# Patient Record
Sex: Female | Born: 1980 | Race: Black or African American | Hispanic: No | Marital: Single | Smoking: Current every day smoker
Health system: Southern US, Community
[De-identification: ages and names within clinical notes are randomized; demographics above are authoritative.]

## PROBLEM LIST (undated history)

## (undated) DIAGNOSIS — N189 Chronic kidney disease, unspecified: Secondary | ICD-10-CM

## (undated) DIAGNOSIS — N200 Calculus of kidney: Secondary | ICD-10-CM

## (undated) DIAGNOSIS — N946 Dysmenorrhea, unspecified: Secondary | ICD-10-CM

## (undated) DIAGNOSIS — R519 Headache, unspecified: Secondary | ICD-10-CM

## (undated) DIAGNOSIS — R51 Headache: Secondary | ICD-10-CM

## (undated) DIAGNOSIS — K219 Gastro-esophageal reflux disease without esophagitis: Secondary | ICD-10-CM

## (undated) DIAGNOSIS — E119 Type 2 diabetes mellitus without complications: Secondary | ICD-10-CM

## (undated) DIAGNOSIS — D219 Benign neoplasm of connective and other soft tissue, unspecified: Secondary | ICD-10-CM

## (undated) HISTORY — DX: Headache, unspecified: R51.9

## (undated) HISTORY — DX: Dysmenorrhea, unspecified: N94.6

## (undated) HISTORY — PX: LITHOTRIPSY: SUR834

## (undated) HISTORY — DX: Calculus of kidney: N20.0

## (undated) HISTORY — DX: Headache: R51

---
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
## 2000-01-27 ENCOUNTER — Emergency Department (HOSPITAL_COMMUNITY): Admission: EM | Admit: 2000-01-27 | Discharge: 2000-01-27 | Payer: Self-pay

## 2000-01-30 ENCOUNTER — Emergency Department (HOSPITAL_COMMUNITY): Admission: EM | Admit: 2000-01-30 | Discharge: 2000-01-30 | Payer: Self-pay | Admitting: *Deleted

## 2000-01-30 ENCOUNTER — Encounter: Payer: Self-pay | Admitting: *Deleted

## 2000-03-30 ENCOUNTER — Encounter: Payer: Self-pay | Admitting: Family Medicine

## 2000-03-30 ENCOUNTER — Ambulatory Visit (HOSPITAL_COMMUNITY): Admission: RE | Admit: 2000-03-30 | Discharge: 2000-03-30 | Payer: Self-pay | Admitting: Family Medicine

## 2000-04-05 ENCOUNTER — Inpatient Hospital Stay (HOSPITAL_COMMUNITY): Admission: AD | Admit: 2000-04-05 | Discharge: 2000-04-05 | Payer: Self-pay | Admitting: *Deleted

## 2000-04-05 ENCOUNTER — Encounter: Payer: Self-pay | Admitting: *Deleted

## 2000-07-05 ENCOUNTER — Inpatient Hospital Stay (HOSPITAL_COMMUNITY): Admission: AD | Admit: 2000-07-05 | Discharge: 2000-07-05 | Payer: Self-pay | Admitting: Obstetrics

## 2000-08-11 ENCOUNTER — Inpatient Hospital Stay (HOSPITAL_COMMUNITY): Admission: AD | Admit: 2000-08-11 | Discharge: 2000-08-11 | Payer: Self-pay | Admitting: Obstetrics and Gynecology

## 2000-08-22 ENCOUNTER — Inpatient Hospital Stay (HOSPITAL_COMMUNITY): Admission: AD | Admit: 2000-08-22 | Discharge: 2000-08-23 | Payer: Self-pay | Admitting: Obstetrics and Gynecology

## 2000-10-10 ENCOUNTER — Encounter: Admission: RE | Admit: 2000-10-10 | Discharge: 2001-01-08 | Payer: Self-pay | Admitting: Obstetrics and Gynecology

## 2000-10-16 ENCOUNTER — Inpatient Hospital Stay (HOSPITAL_COMMUNITY): Admission: AD | Admit: 2000-10-16 | Discharge: 2000-10-22 | Payer: Self-pay | Admitting: Obstetrics and Gynecology

## 2000-10-19 ENCOUNTER — Encounter: Payer: Self-pay | Admitting: Obstetrics & Gynecology

## 2000-11-01 ENCOUNTER — Inpatient Hospital Stay (HOSPITAL_COMMUNITY): Admission: AD | Admit: 2000-11-01 | Discharge: 2000-11-05 | Payer: Self-pay | Admitting: Obstetrics and Gynecology

## 2001-01-28 ENCOUNTER — Emergency Department (HOSPITAL_COMMUNITY): Admission: EM | Admit: 2001-01-28 | Discharge: 2001-01-28 | Payer: Self-pay

## 2001-01-28 ENCOUNTER — Encounter: Payer: Self-pay | Admitting: Emergency Medicine

## 2001-06-15 ENCOUNTER — Inpatient Hospital Stay (HOSPITAL_COMMUNITY): Admission: AD | Admit: 2001-06-15 | Discharge: 2001-06-15 | Payer: Self-pay | Admitting: Obstetrics and Gynecology

## 2003-04-03 ENCOUNTER — Encounter: Payer: Self-pay | Admitting: Emergency Medicine

## 2003-04-03 ENCOUNTER — Emergency Department (HOSPITAL_COMMUNITY): Admission: EM | Admit: 2003-04-03 | Discharge: 2003-04-03 | Payer: Self-pay | Admitting: Emergency Medicine

## 2005-10-19 ENCOUNTER — Emergency Department (HOSPITAL_COMMUNITY): Admission: EM | Admit: 2005-10-19 | Discharge: 2005-10-19 | Payer: Self-pay | Admitting: Emergency Medicine

## 2008-06-25 ENCOUNTER — Emergency Department (HOSPITAL_COMMUNITY): Admission: EM | Admit: 2008-06-25 | Discharge: 2008-06-25 | Payer: Self-pay | Admitting: Emergency Medicine

## 2009-11-07 ENCOUNTER — Emergency Department (HOSPITAL_COMMUNITY): Admission: EM | Admit: 2009-11-07 | Discharge: 2009-11-07 | Payer: Self-pay | Admitting: Emergency Medicine

## 2011-01-16 LAB — URINALYSIS, ROUTINE W REFLEX MICROSCOPIC
Glucose, UA: 250 mg/dL — AB
Hgb urine dipstick: NEGATIVE
Ketones, ur: NEGATIVE mg/dL
Protein, ur: NEGATIVE mg/dL

## 2011-03-18 NOTE — Discharge Summary (Signed)
[facility]  PatientTINLEIGH, Maria [name] Visit Number: [id number] MRN: [id number]          Service Type: OBS Location: 910B [id number] [id number] Attending Physician:  Marcelle Overlie Dictated by:   Maria Maria [name], P.A.-C. Admit Date:  08/21/2000 Discharge Date: 08/23/2000                             Discharge Summary  FINAL DIAGNOSES:              1. Intrauterine pregnancy at 25-3/[redacted] weeks                                  gestation.                               2. Cellulitis of the buttocks.  HISTORY:                      This 30 year old G1, P0, presents at 25-3/[redacted] weeks gestation complaining of buttocks pain.  The patient was being treated for a boil on her buttocks with Keflex but experienced nausea with the Keflex and therefore discontinued.  She had increased pain on her buttocks and drainage.  She denied any fever or chills.  She was not having any contractions.  PHYSICAL EXAMINATION:         There was a draining sore in the inner crease of her buttocks with surrounding warmth, tenderness, and induration.  HOSPITAL COURSE:              At this point, the patient was admitted for IV antibiotics and sitz baths.  The patient continued to have pain on hospital day #1.  Her pain starting decreasing by hospital day #2, and she was on her Ancef and was continued at this point.  By hospital day #3, the patient was feeling much better and was ready for discharge.  On her buttocks, there was no erythema, no drainage, but still a little bit of tenderness.  The patient was sent home on a regular diet, told to decrease activity, told to continue her sitz bath.  DISCHARGE MEDICATIONS:        1. Keflex 500 mg 1 q.i.d. x 7 days.                               2. Tylox #20, 1 every 4 hours as needed for                                  pain.  FOLLOW-UP:                    The patient was to keep her appointment in the office for the next day for her one hour sugar  test. Dictated by:   Maria Maria [name], P.A.-C. Attending Physician:  Marcelle Overlie DD:  09/13/00 TD:  09/13/00 Job: [id number]

## 2011-03-18 NOTE — Discharge Summary (Signed)
[facility]  PatientKERINGTON, HILDEBRANT Visit Number: [id number] MRN: [id number]          Service Type: OBS Location: 910A 9107 01 Attending Physician:  Marcelle Overlie Dictated by:   Leilani Able, P.A. Admit Date:  11/01/2000 Discharge Date: 11/05/2000                             Discharge Summary  FINAL DIAGNOSES:              1. Intrauterine pregnancy at 33-1/[redacted] weeks                                  gestation.                               2. Preterm labor.                               3. Insulin requiring gestational diabetes                                  mellitus.  HISTORY/HOSPITAL COURSE:      This 30 year old G1, P0 presents around 33-1/7 weeks complaining of cramping for two days.  She has noticed an increase in her pain and cervix was noted to be about 2 to 3 cm dilated, 80% effaced and a -2 station upon admission.  She was started in IV fluids and subcutaneous terbutaline but the contractions continued.  Therefore she was admitted.  The patient was admitted at this time, started on magnesium sulfate and betamethasone protocol.  She had an increase in her contractions and pain and the magnesium was increased to 3 g/hour.  The patients blood sugars were still real elevated.  She was started on sliding scale insulin at this point. Her contractions started to resolve.  She was feeling better.  Her magnesium sulfate was decreased to 2 g on October 18, 2000, and she was continued on her insulin.  The patients group B strep culture came back as positive and on October 19, 2000, she was started on Unasyn 3 g IV every six hours to cover that.  She was continued on her magnesium sulfate. By hospital day #6, the patient was continued on her insulin and she was weaned off her magnesium sulfate and started on Procardia.  She had complained of some labial swelling and pain but the etiology was questionable.  By hospital day #7, the  patient was not having any contractions on her oral terbutaline.  Her diabetes was well controlled on her insulin regimen and she was felt ready for discharge. She was sent home on Procardia 10 mg q.i.d., bedrest, insulin and penicillin q.i.d. x 10 days to cover the group B strep.  The patient was to follow up in the office in four days and to call with any problems. Dictated by:   Leilani Able, P.A. Attending Physician:  Marcelle Overlie DD:  11/28/00 TD:  11/28/00 Job: [id number]

## 2011-03-18 NOTE — Op Note (Signed)
[facility]  Patient:    Maria [name], Maria [name]                        MRN: [id number] Proc. Date: 11/02/00 Adm. Date:  [id number] Attending:  Marcelle Overlie                           Operative Report  PREOPERATIVE DIAGNOSES:       1. Intrauterine pregnancy at 81 weeks estimated                                  gestational age.                               2. Failure to progress.                               3. Insulin-dependent gestational diabetes.                               4. Prolonged rupture of membranes.  POSTOPERATIVE DIAGNOSES:      1. Intrauterine pregnancy at 31 weeks estimated                                  gestational age.                               2. Failure to progress.                               3. Insulin-dependent gestational diabetes.                               4. Prolonged rupture of membranes.  PROCEDURE:                    Primary low-transverse cesarean section.  SURGEON:                      Mark E. Dareen Piano, M.D.  ANESTHESIA:                   Epidural.  ANTIBIOTICS:                  Penicillin.  DRAINS:                       Foley, bedside drainage.  ESTIMATED BLOOD LOSS:         900 cc.  COMPLICATIONS:                None.  SPECIMENS:                    None.  FINDINGS:                     Patient had normal-appearing fallopian tubes, ovary and uterus.  The placenta appeared to be normal and had a 3-vessel cord.  PROCEDURE:  Patient was taken to the operating room, where she was placed in the dorsal supine position with a left lateral tilt.  Once an adequate anesthetic level was reached, she was prepped with Hibiclens and draped in the usual fashion for this procedure.  A Pfannenstiel incision was made.  This was carried down to fascia.  The fascia was entered in the midline and extended laterally with the Mayo scissors.  The rectus muscles were then sharply dissected from the fascia.  Rectus muscles were divided in the midline and taken superiorly and inferiorly.  The parietal peritoneum was entered sharply.  The bladder flap was taken down sharply.  A low-transverse uterine incision was made in the midline and extended laterally with blunt dissection. Amniotic fluid was noted to be clear when entering the amniotic sac.  The infant was delivered in the vertex presentation.  On delivery of the head, the nostrils and oropharynx were bulb suctioned.  The remaining infant was then delivered.  The cord was doubly clamped and cut and the infant handed to the waiting NICU team.  The placenta was then manually removed.  The uterus was exteriorized.  The uterine cavity was wiped with a wet lap.  The uterine incision was closed in a single layer of 0 chromic in a running locking fashion.  The posterior cul-de-sac was suctioned dry.  The uterus was placed back in the abdominal cavity.  The abdominal gutters were then wiped with a wet lap.  The bladder flap was not closed.  The parietal peritoneum was not closed.  The fascia was closed using 0 Monocryl in a running fashion.  The subcuticular tissue was made hemostatic with the Bovie.  Stainless steel clips were used to close the skin.  The patient tolerated the procedure well and she was taken to the recovery room in stable condition.  Instrument and lap counts correct x 2. DD:  11/02/00 TD:  11/02/00 Job: [id number]

## 2011-03-18 NOTE — Discharge Summary (Signed)
[facility]  PatientANVI, Maria [name]                        MRN: [id number] Adm. Date:  [id number] Disc. Date: [id number] Attending:  Marcelle Overlie Dictator:   Leilani Able, P.A.                           Discharge Summary  FINAL DIAGNOSES:              1. Intrauterine pregnancy at 36 weeks estimated                                  gestational age.                               2. Failure to progress.                               3. Insulin-dependent gestational diabetes.                               4. Prolonged rupture of membranes.  PROCEDURE:                    Primary low transverse cesarean section.  SURGEON:                      Mark E. Dareen Piano, M.D.  COMPLICATIONS:                None.  HISTORY OF PRESENT ILLNESS:   This 30 year old, G1, P0 presents at 35-6/7ths weeks gestation for induction secondary to prolonged preterm premature rupture of membranes with the AFI in the office today of about 2.9 cm.  Patients prenatal course has been complicated by insulin-dependent gestational diabetes.  Patient decided to stop her insulin on her own two days ago.  She is also positive group B strep, has a history of preterm labor, and has received steroids already this pregnancy.  She also has a history of asthma.  HOSPITAL COURSE:              At this point, patient is admitted by Dr. Marcelle Overlie.  Patient started on Pitocin and penicillin for positive group B strep status.  She is also on an insulin drip per protocol.  Patient had been on Pitocin since admission and had no change in her cervix.  By November 02, 2000, at this point, patient will go to the OR for cesarean section secondary to failure to progress.  She was taken to the operating room by Dr. Malva Limes where a primary low transverse cesarean section was performed with the delivery of a 5-pound-8-ounce female infant with Apgars of 8 and 9.  Delivery went without  complication.  Patients postoperative course was benign without significant fevers.  She was felt ready for discharge on postoperative day #4.  She was sent home on a regular diet, told to decrease activities, told to continue prenatal vitamins and FeSO4, was given Tylenol one to two every four hours as needed for pain and told she could use Motrin and to follow up in the office in four weeks. DD:  11/28/00 TD:  11/28/00 Job: [id number]

## 2011-08-18 ENCOUNTER — Inpatient Hospital Stay (INDEPENDENT_AMBULATORY_CARE_PROVIDER_SITE_OTHER)
Admission: RE | Admit: 2011-08-18 | Discharge: 2011-08-18 | Disposition: A | Discharge status: Home / Self Care | Payer: Self-pay | Source: Ambulatory Visit | Attending: Family Medicine | Admitting: Family Medicine

## 2011-08-18 DIAGNOSIS — J039 Acute tonsillitis, unspecified: Secondary | ICD-10-CM

## 2011-08-22 ENCOUNTER — Emergency Department (HOSPITAL_COMMUNITY)
Admission: EM | Admit: 2011-08-22 | Discharge: 2011-08-23 | Disposition: A | Discharge status: Home / Self Care | Payer: Self-pay | Attending: Emergency Medicine | Admitting: Emergency Medicine

## 2011-08-22 DIAGNOSIS — J029 Acute pharyngitis, unspecified: Secondary | ICD-10-CM | POA: Insufficient documentation

## 2011-08-22 DIAGNOSIS — R509 Fever, unspecified: Secondary | ICD-10-CM | POA: Insufficient documentation

## 2011-08-22 DIAGNOSIS — R1310 Dysphagia, unspecified: Secondary | ICD-10-CM | POA: Insufficient documentation

## 2011-08-22 DIAGNOSIS — E860 Dehydration: Secondary | ICD-10-CM | POA: Insufficient documentation

## 2011-08-22 DIAGNOSIS — B370 Candidal stomatitis: Secondary | ICD-10-CM | POA: Insufficient documentation

## 2011-08-22 LAB — RAPID STREP SCREEN (MED CTR MEBANE ONLY): Streptococcus, Group A Screen (Direct): NEGATIVE

## 2012-09-23 ENCOUNTER — Encounter (HOSPITAL_COMMUNITY): Payer: Self-pay | Admitting: *Deleted

## 2012-09-23 ENCOUNTER — Emergency Department (HOSPITAL_COMMUNITY)
Admission: EM | Admit: 2012-09-23 | Discharge: 2012-09-23 | Disposition: A | Discharge status: Home / Self Care | Payer: Self-pay | Attending: Emergency Medicine | Admitting: Emergency Medicine

## 2012-09-23 DIAGNOSIS — K219 Gastro-esophageal reflux disease without esophagitis: Secondary | ICD-10-CM | POA: Insufficient documentation

## 2012-09-23 DIAGNOSIS — R1013 Epigastric pain: Secondary | ICD-10-CM | POA: Insufficient documentation

## 2012-09-23 DIAGNOSIS — M549 Dorsalgia, unspecified: Secondary | ICD-10-CM | POA: Insufficient documentation

## 2012-09-23 DIAGNOSIS — R109 Unspecified abdominal pain: Secondary | ICD-10-CM

## 2012-09-23 DIAGNOSIS — F17200 Nicotine dependence, unspecified, uncomplicated: Secondary | ICD-10-CM | POA: Insufficient documentation

## 2012-09-23 DIAGNOSIS — R197 Diarrhea, unspecified: Secondary | ICD-10-CM | POA: Insufficient documentation

## 2012-09-23 LAB — URINALYSIS, ROUTINE W REFLEX MICROSCOPIC
Ketones, ur: NEGATIVE mg/dL
Leukocytes, UA: NEGATIVE
Nitrite: NEGATIVE
Protein, ur: NEGATIVE mg/dL
pH: 5.5 (ref 5.0–8.0)

## 2012-09-23 LAB — BASIC METABOLIC PANEL
Calcium: 9 mg/dL (ref 8.4–10.5)
Chloride: 104 mEq/L (ref 96–112)
Creatinine, Ser: 0.93 mg/dL (ref 0.50–1.10)
GFR calc Af Amer: >90 mL/min (ref >90)
Sodium: 139 mEq/L (ref 135–145)

## 2012-09-23 LAB — URINE MICROSCOPIC-ADD ON

## 2012-09-23 LAB — CBC WITH DIFFERENTIAL/PLATELET
Basophils Absolute: 0 10*3/uL (ref 0.0–0.1)
Basophils Relative: 0 % (ref 0–1)
HCT: 40.3 % (ref 36.0–46.0)
MCHC: 33.7 g/dL (ref 30.0–36.0)
Monocytes Absolute: 0.4 10*3/uL (ref 0.1–1.0)
Neutro Abs: 4.2 10*3/uL (ref 1.7–7.7)
Platelets: 204 10*3/uL (ref 150–400)
RDW: 13.4 % (ref 11.5–15.5)

## 2012-09-23 LAB — HEPATIC FUNCTION PANEL: Bilirubin, Direct: <0.1 mg/dL (ref 0.0–0.3)

## 2012-09-23 MED ORDER — GI COCKTAIL ~~LOC~~
30.0000 mL | Freq: Once | ORAL | Status: AC
  Administered 2012-09-23: 30 mL via ORAL
  Filled 2012-09-23: qty 30

## 2012-09-23 NOTE — ED Provider Notes (Signed)
History  This chart was scribed for Geoffery Lyons, MD by Shari Heritage, ED Scribe. The patient was seen in room TR09C/TR09C. Patient's care was started at 1129.   CSN: [id number]  Arrival date & time 09/23/12  1031   First MD Initiated Contact with Patient 09/23/12 1129      Chief Complaint  Patient presents with  . Abdominal Pain    The history is provided by the patient. No language interpreter was used.    HPI Comments: Maria [name] is a 31 y.o. female who presents to the Emergency Department complaining of moderate to severe, burning, epigastric abdominal pain onset 1 week ago. Patient states that the pain is always present, but it waxes and wanes in severity. There is associated diarrhea and lower back pain that shoots upward. Patient denies irregular frequency of bowel movements, vomiting, or fever. Patient denies having had pain like this before. She has no known history of ulcer or reflux. Patient has taken Ibuprofen, BC's powder and Tums with no relief. LMP is 09/23/2012 and patient says past periods have been normal and regular. Patient reports no other significant past medical history. She is a current every day smoker.   Past Surgical History  Procedure Date  . Cesarean section     History reviewed. No pertinent family history.  History  Substance Use Topics  . Smoking status: Current Every Day Smoker  . Smokeless tobacco: Not on file  . Alcohol Use: Yes    OB History    Grav Para Term Preterm Abortions TAB SAB Ect Mult Living                  Review of Systems  Constitutional: Negative for fever.  Gastrointestinal: Positive for abdominal pain and diarrhea. Negative for vomiting.  Musculoskeletal: Positive for back pain.  All other systems reviewed and are negative.    Allergies  Review of patient's allergies indicates no known allergies.  Home Medications   Current Outpatient Rx  Name  Route  Sig  Dispense  Refill  . BC HEADACHE POWDER PO    Oral   Take 1 packet by mouth daily as needed.         . IBUPROFEN 200 MG PO TABS   Oral   Take 800 mg by mouth every 6 (six) hours as needed. For pain           BP 123/73  Pulse 88  Temp 98.2 F (36.8 C) (Oral)  Resp 20  Ht 4\' 11"  (1.499 m)  Wt 154 lb (69.854 kg)  BMI 31.10 kg/m2  SpO2 99%  Physical Exam  Constitutional: She is oriented to person, place, and time. She appears well-developed and well-nourished. No distress.  HENT:  Head: Normocephalic and atraumatic.  Cardiovascular: Normal rate.   Pulmonary/Chest: Effort normal.  Abdominal: Soft. Bowel sounds are normal. There is tenderness in the right upper quadrant, epigastric area and left upper quadrant. There is no rebound and no guarding.       Tenderness to palpation in epigastrium and left and right upper quadrants. No rebound. No guarding. Bowel sounds are present.  Musculoskeletal: Normal range of motion. She exhibits no edema.  Neurological: She is alert and oriented to person, place, and time.  Skin: Skin is warm and dry.  Psychiatric: She has a normal mood and affect. Her behavior is normal.    ED Course  Procedures (including critical care time) DIAGNOSTIC STUDIES: Oxygen Saturation is 99% on room air, normal by  my interpretation.    COORDINATION OF CARE: 11:43 AM- Patient informed of current plan for treatment and evaluation and agrees with plan at this time. Will administer GI cocktail and order hepatic func panel, blood lipase, urinalysis, CBC and basic metabolic panel.    Labs Reviewed  BASIC METABOLIC PANEL - Abnormal; Notable for the following:    Glucose, Bld 136 (*)     GFR calc non Af Amer 81 (*)     All other components within normal limits  URINALYSIS, ROUTINE W REFLEX MICROSCOPIC - Abnormal; Notable for the following:    Glucose, UA >1000 (*)     All other components within normal limits  HEPATIC FUNCTION PANEL - Abnormal; Notable for the following:    Total Bilirubin 0.1 (*)     All  other components within normal limits  URINE MICROSCOPIC-ADD ON - Abnormal; Notable for the following:    Squamous Epithelial / LPF FEW (*)     All other components within normal limits  CBC WITH DIFFERENTIAL  LIPASE, BLOOD  PREGNANCY, URINE   No results found.   No diagnosis found.    MDM  The patient presents to me "burning" in the epigastrium that I suspect is GERD.  She did not get much relief from the GI cocktail, but there is nothing in the history, exam, or workup that is consistent with biliary colic.  I will discharge her to home with recommendations to start prilosec 20 mg twice daily and return prn.      I personally performed the services described in this documentation, which was scribed in my presence. The recorded information has been reviewed and is accurate.      Geoffery Lyons, MD 09/23/12 1255

## 2012-09-23 NOTE — ED Notes (Signed)
Patient states she has been having diarrhea, stomach and back pain x1 wk. Denies fever, chills or emesis. Patient states she has not been to her primary care doctor which is [facility].

## 2012-09-23 NOTE — ED Notes (Signed)
Pt. Stated, I feel nauseated. 

## 2012-09-23 NOTE — ED Notes (Addendum)
Patient states she noted onset of stomach burning and lower back pain for 1 week.  Patient states her sx have gotten worse.  Patient states she has even been woken up at night due to her sx.  She thought it was heart burn.  Patient has had diarrhea often in the last week.  She states she has hx of same.   She then states she has had nausea, headaches, dizziness and she takes nap

## 2014-05-25 ENCOUNTER — Emergency Department (HOSPITAL_COMMUNITY)
Admission: EM | Admit: 2014-05-25 | Discharge: 2014-05-25 | Disposition: A | Discharge status: Home / Self Care | Payer: BC Managed Care – PPO | Attending: Emergency Medicine | Admitting: Emergency Medicine

## 2014-05-25 ENCOUNTER — Encounter (HOSPITAL_COMMUNITY): Payer: Self-pay | Admitting: Emergency Medicine

## 2014-05-25 ENCOUNTER — Emergency Department (HOSPITAL_COMMUNITY): Payer: BC Managed Care – PPO

## 2014-05-25 DIAGNOSIS — S0101XA Laceration without foreign body of scalp, initial encounter: Secondary | ICD-10-CM

## 2014-05-25 DIAGNOSIS — Z792 Long term (current) use of antibiotics: Secondary | ICD-10-CM | POA: Insufficient documentation

## 2014-05-25 DIAGNOSIS — Y9389 Activity, other specified: Secondary | ICD-10-CM | POA: Insufficient documentation

## 2014-05-25 DIAGNOSIS — Z7982 Long term (current) use of aspirin: Secondary | ICD-10-CM | POA: Insufficient documentation

## 2014-05-25 DIAGNOSIS — F17200 Nicotine dependence, unspecified, uncomplicated: Secondary | ICD-10-CM | POA: Insufficient documentation

## 2014-05-25 DIAGNOSIS — S0990XA Unspecified injury of head, initial encounter: Secondary | ICD-10-CM | POA: Insufficient documentation

## 2014-05-25 DIAGNOSIS — F411 Generalized anxiety disorder: Secondary | ICD-10-CM | POA: Insufficient documentation

## 2014-05-25 DIAGNOSIS — Z23 Encounter for immunization: Secondary | ICD-10-CM | POA: Insufficient documentation

## 2014-05-25 DIAGNOSIS — S0100XA Unspecified open wound of scalp, initial encounter: Secondary | ICD-10-CM | POA: Insufficient documentation

## 2014-05-25 DIAGNOSIS — Y92410 Unspecified street and highway as the place of occurrence of the external cause: Secondary | ICD-10-CM | POA: Insufficient documentation

## 2014-05-25 DIAGNOSIS — R000 Tachycardia, unspecified: Secondary | ICD-10-CM | POA: Insufficient documentation

## 2014-05-25 LAB — CBC WITH DIFFERENTIAL/PLATELET
Basophils Absolute: 0 10*3/uL (ref 0.0–0.1)
Basophils Relative: 0 % (ref 0–1)
Eosinophils Absolute: 0.1 10*3/uL (ref 0.0–0.7)
Eosinophils Relative: 1 % (ref 0–5)
HCT: 38.4 % (ref 36.0–46.0)
Hemoglobin: 12.9 g/dL (ref 12.0–15.0)
Lymphocytes Relative: 32 % (ref 12–46)
Lymphs Abs: 2.6 10*3/uL (ref 0.7–4.0)
MCH: 28.1 pg (ref 26.0–34.0)
MCHC: 33.6 g/dL (ref 30.0–36.0)
MCV: 83.7 fL (ref 78.0–100.0)
Monocytes Absolute: 0.3 10*3/uL (ref 0.1–1.0)
Monocytes Relative: 4 % (ref 3–12)
Neutro Abs: 5.1 10*3/uL (ref 1.7–7.7)
Neutrophils Relative %: 63 % (ref 43–77)
Platelets: 212 10*3/uL (ref 150–400)
RBC: 4.59 MIL/uL (ref 3.87–5.11)
RDW: 13.8 % (ref 11.5–15.5)
WBC: 8.1 10*3/uL (ref 4.0–10.5)

## 2014-05-25 LAB — COMPREHENSIVE METABOLIC PANEL
ALBUMIN: 4.1 g/dL (ref 3.5–5.2)
ALK PHOS: 68 U/L (ref 39–117)
ALT: 27 U/L (ref 0–35)
ANION GAP: 19 — AB (ref 5–15)
AST: 35 U/L (ref 0–37)
BUN: 15 mg/dL (ref 6–23)
CHLORIDE: 102 meq/L (ref 96–112)
CO2: 22 mEq/L (ref 19–32)
Calcium: 9.3 mg/dL (ref 8.4–10.5)
Creatinine, Ser: 1.1 mg/dL (ref 0.50–1.10)
GFR calc non Af Amer: 66 mL/min — ABNORMAL LOW (ref >90)
GFR, EST AFRICAN AMERICAN: 76 mL/min — AB (ref >90)
GLUCOSE: 144 mg/dL — AB (ref 70–99)
POTASSIUM: 4 meq/L (ref 3.7–5.3)
SODIUM: 143 meq/L (ref 137–147)
TOTAL PROTEIN: 7.2 g/dL (ref 6.0–8.3)

## 2014-05-25 MED ORDER — SODIUM CHLORIDE 0.9 % IV BOLUS (SEPSIS)
1000.0000 mL | Freq: Once | INTRAVENOUS | Status: AC
  Administered 2014-05-25: 1000 mL via INTRAVENOUS

## 2014-05-25 MED ORDER — IBUPROFEN 800 MG PO TABS
800.0000 mg | ORAL_TABLET | Freq: Once | ORAL | Status: AC
  Administered 2014-05-25: 800 mg via ORAL
  Filled 2014-05-25: qty 1

## 2014-05-25 MED ORDER — IOHEXOL 300 MG/ML  SOLN
100.0000 mL | Freq: Once | INTRAMUSCULAR | Status: AC | PRN
  Administered 2014-05-25: 100 mL via INTRAVENOUS

## 2014-05-25 MED ORDER — TETANUS-DIPHTH-ACELL PERTUSSIS 5-2.5-18.5 LF-MCG/0.5 IM SUSP
0.5000 mL | Freq: Once | INTRAMUSCULAR | Status: AC
  Administered 2014-05-25: 0.5 mL via INTRAMUSCULAR
  Filled 2014-05-25: qty 0.5

## 2014-05-25 MED ORDER — ACETAMINOPHEN 325 MG PO TABS
650.0000 mg | ORAL_TABLET | Freq: Once | ORAL | Status: AC
  Administered 2014-05-25: 650 mg via ORAL
  Filled 2014-05-25: qty 2

## 2014-05-25 NOTE — ED Notes (Signed)
Pt upset and crying in pain. Dr.Zavitz aware.

## 2014-05-25 NOTE — ED Notes (Signed)
Wash basin and towels given to friends to help clean up pt.

## 2014-05-25 NOTE — Discharge Instructions (Signed)
If you were given medicines take as directed.  If you are on coumadin or contraceptives realize their levels and effectiveness is altered by many different medicines.  If you have any reaction (rash, tongues swelling, other) to the medicines stop taking and see a physician.   Please follow up as directed and return to the ER or see a physician for new or worsening symptoms.  Thank you. Filed Vitals:   05/25/14 0427 05/25/14 0545 05/25/14 0600 05/25/14 0615  BP:      Pulse: 139 114 106 108  Temp:      TempSrc:      Resp:      Height:      Weight:      SpO2: 100% 99% 98% 100%

## 2014-05-25 NOTE — ED Notes (Signed)
Patient transported to CT 
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
## 2014-05-25 NOTE — ED Notes (Signed)
Pt refusing to give urine sample for pregnancy test.  MD notified and said okay to scan pt without pregnancy test.  Pt adamant there is no chance she is pregnant.

## 2014-05-25 NOTE — ED Notes (Signed)
Pt states "there is no way I'm pregnant because this is my girlfriend" when asked for a urine sample for a pregnancy test.
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
## 2014-05-25 NOTE — ED Notes (Signed)
EDP at the bedside to staple pts laceration.
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
## 2014-11-24 ENCOUNTER — Other Ambulatory Visit (HOSPITAL_COMMUNITY)
Admission: RE | Admit: 2014-11-24 | Discharge: 2014-11-24 | Disposition: A | Discharge status: Home / Self Care | Payer: BLUE CROSS/BLUE SHIELD | Source: Ambulatory Visit | Attending: Obstetrics & Gynecology | Admitting: Obstetrics & Gynecology

## 2014-11-24 ENCOUNTER — Other Ambulatory Visit: Payer: Self-pay | Admitting: Obstetrics & Gynecology

## 2014-11-24 DIAGNOSIS — Z01419 Encounter for gynecological examination (general) (routine) without abnormal findings: Secondary | ICD-10-CM | POA: Diagnosis not present

## 2014-11-24 DIAGNOSIS — Z1151 Encounter for screening for human papillomavirus (HPV): Secondary | ICD-10-CM | POA: Diagnosis present

## 2014-11-25 LAB — CYTOLOGY - PAP

## 2015-01-06 ENCOUNTER — Other Ambulatory Visit: Payer: Self-pay | Admitting: Obstetrics & Gynecology

## 2015-01-06 DIAGNOSIS — D259 Leiomyoma of uterus, unspecified: Secondary | ICD-10-CM

## 2015-01-13 ENCOUNTER — Inpatient Hospital Stay: Admission: RE | Admit: 2015-01-13 | Payer: BLUE CROSS/BLUE SHIELD | Source: Ambulatory Visit

## 2015-01-13 HISTORY — DX: Gastro-esophageal reflux disease without esophagitis: K21.9

## 2015-01-13 HISTORY — DX: Benign neoplasm of connective and other soft tissue, unspecified: D21.9

## 2015-03-11 ENCOUNTER — Ambulatory Visit: Payer: BLUE CROSS/BLUE SHIELD | Admitting: Diagnostic Neuroimaging

## 2015-03-12 ENCOUNTER — Encounter: Payer: Self-pay | Admitting: Diagnostic Neuroimaging

## 2015-04-02 ENCOUNTER — Encounter (HOSPITAL_COMMUNITY): Payer: Self-pay

## 2015-04-02 ENCOUNTER — Emergency Department (HOSPITAL_COMMUNITY): Payer: BLUE CROSS/BLUE SHIELD

## 2015-04-02 ENCOUNTER — Emergency Department (HOSPITAL_COMMUNITY)
Admission: EM | Admit: 2015-04-02 | Discharge: 2015-04-02 | Disposition: A | Discharge status: Home / Self Care | Payer: BLUE CROSS/BLUE SHIELD | Attending: Emergency Medicine | Admitting: Emergency Medicine

## 2015-04-02 DIAGNOSIS — R1084 Generalized abdominal pain: Secondary | ICD-10-CM | POA: Diagnosis present

## 2015-04-02 DIAGNOSIS — Z79899 Other long term (current) drug therapy: Secondary | ICD-10-CM | POA: Diagnosis not present

## 2015-04-02 DIAGNOSIS — Z87442 Personal history of urinary calculi: Secondary | ICD-10-CM | POA: Diagnosis not present

## 2015-04-02 DIAGNOSIS — D259 Leiomyoma of uterus, unspecified: Secondary | ICD-10-CM | POA: Insufficient documentation

## 2015-04-02 DIAGNOSIS — R222 Localized swelling, mass and lump, trunk: Secondary | ICD-10-CM | POA: Diagnosis not present

## 2015-04-02 DIAGNOSIS — Z8719 Personal history of other diseases of the digestive system: Secondary | ICD-10-CM | POA: Diagnosis not present

## 2015-04-02 DIAGNOSIS — N23 Unspecified renal colic: Secondary | ICD-10-CM

## 2015-04-02 DIAGNOSIS — Z3202 Encounter for pregnancy test, result negative: Secondary | ICD-10-CM | POA: Diagnosis not present

## 2015-04-02 DIAGNOSIS — Z86018 Personal history of other benign neoplasm: Secondary | ICD-10-CM | POA: Diagnosis not present

## 2015-04-02 LAB — URINALYSIS, ROUTINE W REFLEX MICROSCOPIC
BILIRUBIN URINE: NEGATIVE
Ketones, ur: NEGATIVE mg/dL
Leukocytes, UA: NEGATIVE
Nitrite: NEGATIVE
Protein, ur: NEGATIVE mg/dL
SPECIFIC GRAVITY, URINE: 1.025 (ref 1.005–1.030)
Urobilinogen, UA: 0.2 mg/dL (ref 0.0–1.0)
pH: 6.5 (ref 5.0–8.0)

## 2015-04-02 LAB — COMPREHENSIVE METABOLIC PANEL
ALBUMIN: 3.4 g/dL — AB (ref 3.5–5.0)
ALT: 18 U/L (ref 14–54)
AST: 19 U/L (ref 15–41)
Alkaline Phosphatase: 44 U/L (ref 38–126)
Anion gap: 6 (ref 5–15)
BUN: 11 mg/dL (ref 6–20)
CO2: 24 mmol/L (ref 22–32)
Calcium: 9 mg/dL (ref 8.9–10.3)
Chloride: 108 mmol/L (ref 101–111)
Creatinine, Ser: 0.97 mg/dL (ref 0.44–1.00)
GFR calc non Af Amer: >60 mL/min (ref >60)
Glucose, Bld: 162 mg/dL — ABNORMAL HIGH (ref 65–99)
POTASSIUM: 4.2 mmol/L (ref 3.5–5.1)
Sodium: 138 mmol/L (ref 135–145)
TOTAL PROTEIN: 6.6 g/dL (ref 6.5–8.1)

## 2015-04-02 LAB — URINE MICROSCOPIC-ADD ON

## 2015-04-02 LAB — CBC WITH DIFFERENTIAL/PLATELET
Basophils Absolute: 0 10*3/uL (ref 0.0–0.1)
Basophils Relative: 0 % (ref 0–1)
Eosinophils Absolute: 0.1 10*3/uL (ref 0.0–0.7)
Eosinophils Relative: 1 % (ref 0–5)
HCT: 38.9 % (ref 36.0–46.0)
Hemoglobin: 13.2 g/dL (ref 12.0–15.0)
Lymphocytes Relative: 38 % (ref 12–46)
Lymphs Abs: 2.6 10*3/uL (ref 0.7–4.0)
MCH: 27.7 pg (ref 26.0–34.0)
MCHC: 33.9 g/dL (ref 30.0–36.0)
MCV: 81.7 fL (ref 78.0–100.0)
MONOS PCT: 5 % (ref 3–12)
Monocytes Absolute: 0.3 10*3/uL (ref 0.1–1.0)
NEUTROS ABS: 3.8 10*3/uL (ref 1.7–7.7)
Neutrophils Relative %: 56 % (ref 43–77)
PLATELETS: 208 10*3/uL (ref 150–400)
RBC: 4.76 MIL/uL (ref 3.87–5.11)
RDW: 14.1 % (ref 11.5–15.5)
WBC: 6.9 10*3/uL (ref 4.0–10.5)

## 2015-04-02 LAB — LIPASE, BLOOD: LIPASE: 30 U/L (ref 22–51)

## 2015-04-02 LAB — PREGNANCY, URINE: PREG TEST UR: NEGATIVE

## 2015-04-02 MED ORDER — MELOXICAM 7.5 MG PO TABS: 7.5000 mg | ORAL_TABLET | Freq: Every day | ORAL | Status: DC

## 2015-04-02 MED ORDER — KETOROLAC TROMETHAMINE 60 MG/2ML IM SOLN
60.0000 mg | Freq: Once | INTRAMUSCULAR | Status: AC
  Administered 2015-04-02: 60 mg via INTRAMUSCULAR
  Filled 2015-04-02: qty 2

## 2015-04-02 NOTE — ED Provider Notes (Signed)
CSN: [id number]     Arrival date & time 04/02/15  1243 History   First MD Initiated Contact with Patient 04/02/15 1506     Chief Complaint  Patient presents with  . Abdominal Pain     (Consider location/radiation/quality/duration/timing/severity/associated sxs/prior Treatment) The history is provided by the patient.     Pt with hx fibroids, kidney stones, prediabetes p/w left flank pain and generalized abdominal pain.  Pt has had intermittent sharp left flank pain for the past two days.  The worst pain was days ago.  Has increased PO fluids and taken goody powders without improvement.  Was seen yesterday in urgent care and found to have hematuria, suspected kidney stone, started flomax, tramadol.  After starting the medication she started vomiting and began having generalized abdominal pressure.  The pressure is also described as burning and only occurs with lying flat.  The pain is more severe than her typical menstrual cramps and has woken her from sleep.  Has had urinary frequency x 2 months.  Denies fevers, chills, abnormal vaginal discharge or bleeding, change in bowel habits.  Denies dysuria.  Denies abnormal weight loss recently.    Past Medical History  Diagnosis Date  . Prediabetes   . Acid reflux   . Fibroids    Past Surgical History  Procedure Laterality Date  . Cesarean section     No family history on file. History  Substance Use Topics  . Smoking status: Current Every Day Smoker  . Smokeless tobacco: Not on file  . Alcohol Use: Yes   OB History    No data available     Review of Systems  All other systems reviewed and are negative.     Allergies  Review of patient's allergies indicates no known allergies.  Home Medications   Prior to Admission medications   Medication Sig Start Date End Date Taking? Authorizing Provider  Aspirin-Salicylamide-Caffeine (BC HEADACHE POWDER PO) Take 1 packet by mouth daily as needed (pain).    Yes Historical Provider, MD    clotrimazole-betamethasone (LOTRISONE) cream Apply 1 application topically 3 (three) times daily as needed. For rash 02/23/15  Yes Historical Provider, MD  ibuprofen (ADVIL,MOTRIN) 200 MG tablet Take 800 mg by mouth every 6 (six) hours as needed for moderate pain.    Yes Historical Provider, MD  norethindrone-ethinyl estradiol (JUNEL FE,GILDESS FE,LOESTRIN FE) 1-20 MG-MCG tablet Take 1 tablet by mouth daily.   Yes Historical Provider, MD  ondansetron (ZOFRAN) 4 MG tablet Take 4 mg by mouth every 8 (eight) hours as needed for nausea or vomiting.   Yes Historical Provider, MD  polyethylene glycol (MIRALAX / GLYCOLAX) packet Take 17 g by mouth daily as needed for moderate constipation.    Yes Historical Provider, MD  SUMAtriptan (IMITREX) 100 MG tablet Take 100 mg by mouth as needed. For migraines 02/23/15  Yes Historical Provider, MD  tamsulosin (FLOMAX) 0.4 MG CAPS capsule Take 0.4 mg by mouth daily. 04/01/15  Yes Historical Provider, MD  traMADol-acetaminophen (ULTRACET) 37.5-325 MG per tablet Take 1 tablet by mouth every 6 (six) hours as needed.   Yes Historical Provider, MD   BP 136/78 mmHg  Pulse 89  Temp(Src) 98.5 F (36.9 C) (Oral)  Resp 18  Ht 4\' 11"  (1.499 m)  Wt 167 lb (75.751 kg)  BMI 33.71 kg/m2  SpO2 97%  LMP 03/16/2015 Physical Exam  Constitutional: She appears well-developed and well-nourished. No distress.  HENT:  Head: Normocephalic and atraumatic.  Neck: Neck supple.  Cardiovascular:  Normal rate and regular rhythm.   Pulmonary/Chest: Effort normal and breath sounds normal. No respiratory distress. She has no wheezes. She has no rales.  Abdominal: Soft. She exhibits mass. She exhibits no distension. There is generalized tenderness. There is no rebound and no guarding.    Neurological: She is alert.  Skin: She is not diaphoretic.  Nursing note and vitals reviewed.   ED Course  Procedures (including critical care time) Labs Review Labs Reviewed  COMPREHENSIVE  METABOLIC PANEL - Abnormal; Notable for the following:    Glucose, Bld 162 (*)    Albumin 3.4 (*)    Total Bilirubin <0.1 (*)    All other components within normal limits  URINALYSIS, ROUTINE W REFLEX MICROSCOPIC (NOT AT [facility]) - Abnormal; Notable for the following:    Glucose, UA >1000 (*)    Hgb urine dipstick TRACE (*)    All other components within normal limits  URINE MICROSCOPIC-ADD ON - Abnormal; Notable for the following:    Squamous Epithelial / LPF FEW (*)    Bacteria, UA MANY (*)    All other components within normal limits  URINE CULTURE  CBC WITH DIFFERENTIAL/PLATELET  LIPASE, BLOOD  PREGNANCY, URINE    Imaging Review US Transvaginal Non-ob  04/02/2015   CLINICAL DATA:  Suprapubic abdominal pain.  EXAM: TRANSABDOMINAL AND TRANSVAGINAL ULTRASOUND OF PELVIS  TECHNIQUE: Both transabdominal and transvaginal ultrasound examinations of the pelvis were performed. Transabdominal technique was performed for global imaging of the pelvis including uterus, ovaries, adnexal regions, and pelvic cul-de-sac. It was necessary to proceed with endovaginal exam following the transabdominal exam to visualize the endometrium a.  COMPARISON:  05/25/2014  FINDINGS: Uterus  Measurements: 18.5 by 8.0 by 11.1 cm. Several uterine masses include a 9.3 by 8.0 by 0.6 cm posterior uterine body intramural fibroid and a 4.0 by 2.9 by 3.4 cm anterior fundal intramural fibroid. The larger fibroid appears to have some internal degeneration or cystic necrosis centrally.  Endometrium  Not visualized, obscured by the fibroids.  Right ovary  Measurements: 3.1 by 1.8 by 2.5 cm. Normal appearance/no adnexal mass.  Left ovary  Measurements: 2.5 by 2.2 by 2.1 cm. Normal appearance/no adnexal mass.  Other findings  No free fluid.  IMPRESSION: 1. A posterior intramural uterine fibroid has increased in size compared to the CT scan from 2015, currently measuring up to 9.3 cm in long axis. This fibroid obscures the endometrium, and  accordingly comment on endometrial thickness cannot be made. Given the patient's age, uterine sarcoma as an explanation for the significant enlargement of the fibroid is considered statistically unlikely. 2. An anterior intramural fibroid is thought to be present for example on image 9, and is increased in size compared to the prior CT scan where it was not readily apparent.   Electronically Signed   By: Van Clines M.D.   On: 04/02/2015 18:29   US Pelvis Complete  04/02/2015   CLINICAL DATA:  Suprapubic abdominal pain.  EXAM: TRANSABDOMINAL AND TRANSVAGINAL ULTRASOUND OF PELVIS  TECHNIQUE: Both transabdominal and transvaginal ultrasound examinations of the pelvis were performed. Transabdominal technique was performed for global imaging of the pelvis including uterus, ovaries, adnexal regions, and pelvic cul-de-sac. It was necessary to proceed with endovaginal exam following the transabdominal exam to visualize the endometrium a.  COMPARISON:  05/25/2014  FINDINGS: Uterus  Measurements: 18.5 by 8.0 by 11.1 cm. Several uterine masses include a 9.3 by 8.0 by 0.6 cm posterior uterine body intramural fibroid and a 4.0 by 2.9  by 3.4 cm anterior fundal intramural fibroid. The larger fibroid appears to have some internal degeneration or cystic necrosis centrally.  Endometrium  Not visualized, obscured by the fibroids.  Right ovary  Measurements: 3.1 by 1.8 by 2.5 cm. Normal appearance/no adnexal mass.  Left ovary  Measurements: 2.5 by 2.2 by 2.1 cm. Normal appearance/no adnexal mass.  Other findings  No free fluid.  IMPRESSION: 1. A posterior intramural uterine fibroid has increased in size compared to the CT scan from 2015, currently measuring up to 9.3 cm in long axis. This fibroid obscures the endometrium, and accordingly comment on endometrial thickness cannot be made. Given the patient's age, uterine sarcoma as an explanation for the significant enlargement of the fibroid is considered statistically unlikely.  2. An anterior intramural fibroid is thought to be present for example on image 9, and is increased in size compared to the prior CT scan where it was not readily apparent.   Electronically Signed   By: Van Clines M.D.   On: 04/02/2015 18:29   US Renal  04/02/2015   CLINICAL DATA:  Left flank pain with hematuria.  Initial encounter.  EXAM: RENAL / URINARY TRACT ULTRASOUND COMPLETE  COMPARISON:  Abdominal pelvic CT 05/25/2014.  FINDINGS: Right Kidney:  Length: 10.8 cm. Echogenicity within normal limits. No mass or hydronephrosis visualized.  Left Kidney:  Length: 10.4 cm. Echogenicity within normal limits. No mass or hydronephrosis visualized.  Bladder:  Appears normal for the degree of bladder distention. The bladder is nearly empty.  This examination is mildly limited by body habitus.  IMPRESSION: 1. Both kidneys are normal in size without hydronephrosis. 2. Detail mildly limited by body habitus.   Electronically Signed   By: Richardean Sale M.D.   On: 04/02/2015 17:44     EKG Interpretation None      MDM   Final diagnoses:  Renal colic on left side  Uterine leiomyoma, unspecified location    Afebrile nontoxic patient with left flank pain pain with hematuria, known 53mm kidney stones on the left side from CT abd/pelvis done in July of last year.  Has known 25mm fibroid and is on birth control pills for this.  Does continue to have painful periods, has elected to continue birth control pills and does not want surgery.  Has also be diagnosed with gerd vs ulcer in the past and has been referred to GI for endoscopy but pt states she does not want to have a procedure.  Labs are unremarkable.  UA shows glucosuria, 3-6 WBC and many bacteria.  Will send urine for culture.  Renal US unremarkable.  Pelvic US shows great increase in size of fibroid and additional fibroid that was not present on prior study.  Pt advised to follow closely with her GYN.  Have also given urology follow up for presumed renal  colic. Doubt colitis, diverticulitis, ovarian torsion, TOA causing the left flank pain.   D/C home with work note, mobic, gyn, urology follow up.  Pt is on flomax, pain medications from urgent care yesterday.  Discussed result, findings, treatment, and follow up  with patient.  Pt given return precautions.  Pt verbalizes understanding and agrees with plan.         Clayton Bibles, PA-C 04/02/15 2246  Evelina Bucy, MD 04/02/15 [id number]

## 2015-04-02 NOTE — Discharge Instructions (Signed)
Read the information below.  Use the prescribed medication as directed.  Please discuss all new medications with your pharmacist.  You may return to the Emergency Department at any time for worsening condition or any new symptoms that concern you.   If you develop high fevers, worsening abdominal pain, uncontrolled vomiting, or are unable to tolerate fluids by mouth, return to the ER for a recheck.      Kidney Stones Kidney stones (urolithiasis) are deposits that form inside your kidneys. The intense pain is caused by the stone moving through the urinary tract. When the stone moves, the ureter goes into spasm around the stone. The stone is usually passed in the urine.  CAUSES   A disorder that makes certain neck glands produce too much parathyroid hormone (primary hyperparathyroidism).  A buildup of uric acid crystals, similar to gout in your joints.  Narrowing (stricture) of the ureter.  A kidney obstruction present at birth (congenital obstruction).  Previous surgery on the kidney or ureters.  Numerous kidney infections. SYMPTOMS   Feeling sick to your stomach (nauseous).  Throwing up (vomiting).  Blood in the urine (hematuria).  Pain that usually spreads (radiates) to the groin.  Frequency or urgency of urination. DIAGNOSIS   Taking a history and physical exam.  Blood or urine tests.  CT scan.  Occasionally, an examination of the inside of the urinary bladder (cystoscopy) is performed. TREATMENT   Observation.  Increasing your fluid intake.  Extracorporeal shock wave lithotripsy--This is a noninvasive procedure that uses shock waves to break up kidney stones.  Surgery may be needed if you have severe pain or persistent obstruction. There are various surgical procedures. Most of the procedures are performed with the use of small instruments. Only small incisions are needed to accommodate these instruments, so recovery time is minimized. The size, location, and  chemical composition are all important variables that will determine the proper choice of action for you. Talk to your health care provider to better understand your situation so that you will minimize the risk of injury to yourself and your kidney.  HOME CARE INSTRUCTIONS   Drink enough water and fluids to keep your urine clear or pale yellow. This will help you to pass the stone or stone fragments.  Strain all urine through the provided strainer. Keep all particulate matter and stones for your health care provider to see. The stone causing the pain may be as small as a grain of salt. It is very important to use the strainer each and every time you pass your urine. The collection of your stone will allow your health care provider to analyze it and verify that a stone has actually passed. The stone analysis will often identify what you can do to reduce the incidence of recurrences.  Only take over-the-counter or prescription medicines for pain, discomfort, or fever as directed by your health care provider.  Make a follow-up appointment with your health care provider as directed.  Get follow-up X-rays if required. The absence of pain does not always mean that the stone has passed. It may have only stopped moving. If the urine remains completely obstructed, it can cause loss of kidney function or even complete destruction of the kidney. It is your responsibility to make sure X-rays and follow-ups are completed. Ultrasounds of the kidney can show blockages and the status of the kidney. Ultrasounds are not associated with any radiation and can be performed easily in a matter of minutes. SEEK MEDICAL CARE IF:  You experience pain that is progressive and unresponsive to any pain medicine you have been prescribed. SEEK IMMEDIATE MEDICAL CARE IF:   Pain cannot be controlled with the prescribed medicine.  You have a fever or shaking chills.  The severity or intensity of pain increases over 18 hours and  is not relieved by pain medicine.  You develop a new onset of abdominal pain.  You feel faint or pass out.  You are unable to urinate. MAKE SURE YOU:   Understand these instructions.  Will watch your condition.  Will get help right away if you are not doing well or get worse. Document Released: 10/17/2005 Document Revised: 06/19/2013 Document Reviewed: 03/20/2013 [facility] Patient Information 2015 [place], [place]. This information is not intended to replace advice given to you by your health care provider. Make sure you discuss any questions you have with your health care provider.  Ureteral Colic (Kidney Stones) Ureteral colic is the result of a condition when kidney stones form inside the kidney. Once kidney stones are formed they may move into the tube that connects the kidney with the bladder (ureter). If this occurs, this condition may cause pain (colic) in the ureter.  CAUSES  Pain is caused by stone movement in the ureter and the obstruction caused by the stone. SYMPTOMS  The pain comes and goes as the ureter contracts around the stone. The pain is usually intense, sharp, and stabbing in character. The location of the pain may move as the stone moves through the ureter. When the stone is near the kidney the pain is usually located in the back and radiates to the belly (abdomen). When the stone is ready to pass into the bladder the pain is often located in the lower abdomen on the side the stone is located. At this location, the symptoms may mimic those of a urinary tract infection with urinary frequency. Once the stone is located here it often passes into the bladder and the pain disappears completely. TREATMENT   Your caregiver will provide you with medicine for pain relief.  You may require specialized follow-up X-rays.  The absence of pain does not always mean that the stone has passed. It may have just stopped moving. If the urine remains completely obstructed, it can cause  loss of kidney function or even complete destruction of the involved kidney. It is your responsibility and in your interest that X-rays and follow-ups as suggested by your caregiver are completed. Relief of pain without passage of the stone can be associated with severe damage to the kidney, including loss of kidney function on that side.  If your stone does not pass on its own, additional measures may be taken by your caregiver to ensure its removal. HOME CARE INSTRUCTIONS   Increase your fluid intake. Water is the preferred fluid since juices containing vitamin C may acidify the urine making it less likely for certain stones (uric acid stones) to pass.  Strain all urine. A strainer will be provided. Keep all particulate matter or stones for your caregiver to inspect.  Take your pain medicine as directed.  Make a follow-up appointment with your caregiver as directed.  Remember that the goal is passage of your stone. The absence of pain does not mean the stone is gone. Follow your caregiver's instructions.  Only take over-the-counter or prescription medicines for pain, discomfort, or fever as directed by your caregiver. SEEK MEDICAL CARE IF:   Pain cannot be controlled with the prescribed medicine.  You have a  fever.  Pain continues for longer than your caregiver advises it should.  There is a change in the pain, and you develop chest discomfort or constant abdominal pain.  You feel faint or pass out. MAKE SURE YOU:   Understand these instructions.  Will watch your condition.  Will get help right away if you are not doing well or get worse. Document Released: 07/27/2005 Document Revised: 02/11/2013 Document Reviewed: 04/13/2011 [facility] Patient Information 2015 [place], [place]. This information is not intended to replace advice given to you by your health care provider. Make sure you discuss any questions you have with your health care provider.  Fibroids Fibroids are lumps  (tumors) that can occur any place in a woman's body. These lumps are not cancerous. Fibroids vary in size, weight, and where they grow. HOME CARE  Do not take aspirin.  Write down the number of pads or tampons you use during your period. Tell your doctor. This can help determine the best treatment for you. GET HELP RIGHT AWAY IF:  You have pain in your lower belly (abdomen) that is not helped with medicine.  You have cramps that are not helped with medicine.  You have more bleeding between or during your period.  You feel lightheaded or pass out (faint).  Your lower belly pain gets worse. MAKE SURE YOU:  Understand these instructions.  Will watch your condition.  Will get help right away if you are not doing well or get worse. Document Released: 11/19/2010 Document Revised: 01/09/2012 Document Reviewed: 11/19/2010 [facility] Patient Information 2015 [place], [place]. This information is not intended to replace advice given to you by your health care provider. Make sure you discuss any questions you have with your health care provider.

## 2015-04-02 NOTE — ED Notes (Signed)
Called main lab spoke with casey. Sent urine culture slip down as an add on.

## 2015-04-02 NOTE — ED Notes (Signed)
Pt getting changed into gown. 

## 2015-04-02 NOTE — ED Notes (Signed)
Pt placed on monitor upon return to room from ultrasound. Pt remains monitored by blood pressure and pulse ox.

## 2015-04-02 NOTE — ED Notes (Signed)
Pt has been having mid upper abd pain that has been sharp for a while and has been to the doctor a few times. Has been to Urgent care and been told it was kidney stones but she is tired of getting medicine and just wants to know what is causing the pain because it's not going away. She got tramadol and it's too strong for her. 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 Eats a lot of greasy foods.

## 2015-04-02 NOTE — ED Notes (Signed)
Pt advised of policy to wait 30 minutes after IM injection before d/c. Pt chose to leave at this time. Pt instructed to com e back with any new or worsening symptoms.

## 2015-04-04 LAB — URINE CULTURE

## 2015-05-19 ENCOUNTER — Encounter (HOSPITAL_COMMUNITY)
Admission: RE | Admit: 2015-05-19 | Discharge: 2015-05-19 | Disposition: A | Discharge status: Home / Self Care | Payer: BLUE CROSS/BLUE SHIELD | Source: Ambulatory Visit | Attending: Obstetrics & Gynecology | Admitting: Obstetrics & Gynecology

## 2015-05-19 ENCOUNTER — Encounter (HOSPITAL_COMMUNITY): Payer: Self-pay

## 2015-05-19 DIAGNOSIS — Z01818 Encounter for other preprocedural examination: Secondary | ICD-10-CM | POA: Insufficient documentation

## 2015-05-19 DIAGNOSIS — D259 Leiomyoma of uterus, unspecified: Secondary | ICD-10-CM | POA: Diagnosis not present

## 2015-05-19 HISTORY — DX: Chronic kidney disease, unspecified: N18.9

## 2015-05-19 LAB — BASIC METABOLIC PANEL
Anion gap: 5 (ref 5–15)
BUN: 16 mg/dL (ref 6–20)
CO2: 25 mmol/L (ref 22–32)
Calcium: 8.7 mg/dL — ABNORMAL LOW (ref 8.9–10.3)
Chloride: 108 mmol/L (ref 101–111)
Creatinine, Ser: 0.98 mg/dL (ref 0.44–1.00)
GFR calc Af Amer: >60 mL/min (ref >60)
Glucose, Bld: 165 mg/dL — ABNORMAL HIGH (ref 65–99)
Potassium: 4 mmol/L (ref 3.5–5.1)
Sodium: 138 mmol/L (ref 135–145)

## 2015-05-19 LAB — TYPE AND SCREEN
ABO/RH(D): B POS
Antibody Screen: NEGATIVE

## 2015-05-19 LAB — CBC
HCT: 37.8 % (ref 36.0–46.0)
HEMOGLOBIN: 12.9 g/dL (ref 12.0–15.0)
MCH: 28.3 pg (ref 26.0–34.0)
MCHC: 34.1 g/dL (ref 30.0–36.0)
MCV: 82.9 fL (ref 78.0–100.0)
Platelets: 205 10*3/uL (ref 150–400)
RBC: 4.56 MIL/uL (ref 3.87–5.11)
RDW: 14.1 % (ref 11.5–15.5)
WBC: 6.4 10*3/uL (ref 4.0–10.5)

## 2015-05-19 LAB — ABO/RH: ABO/RH(D): B POS

## 2015-05-19 NOTE — H&P (Signed)
H&P 34 y/o G1P1 who presents for preop appointment for scheduled TAH, BS due to Abnormal uterine bleeding/Heavy menstrual bleeding and dysmenorrhea due to uterine fibroids. Pt has failed medical management and declined [place]. The OCPs do help somewhat; however, her periods remain heavy and painful. Menses last a full 7 days with 4-5 "bad" days using at least 5-6 super tampons each day and sometimes having to change them every 1-2 hours. She has been managing the dysmenorrhea with Motrin with minimal relief. Recently, her symptoms have been compounded by a kidney stone as she now also has increased pelvic pressure, back pain and dysuria. Of note, she will ultimately require surgery for her stones and was advised to address her gyneolcogic issues first by urology. Due to the stones, she was seen in the ER where an [place] was performed (findings below), which show enlarging fibroids. She is not interested in future fertility and desire surgical management. [place] from EPIC: 04/02/15: uterus measures 18.5x8x11.1cm with largest fibroid measuring 9.3x8x0.6cm posterior fibroid with degeneration and intramural fibroid measuring 4x3x3.4cm. Ovariesl unremarkable. CBC also performed Hgb 13.2- no evidence of anemia. (Previous US performed on 11/27/14): Enlarged uterus measuring 7.8x14.8cmx9.3cm- posterior submucosal fibroid measures 6.7x6.3x6cm, anterior 2.9x2.4x2.3cm. Smaller fibroids within uterus <1.2cm. Thin endometrium.   Current Medications  Taking   Gildess 1/20(Norethindrone Acet-Ethinyl Est) 1-20 MG-MCG Tablet 1 tablet Daily for Three Weeks, 1 Week off    Past Medical History  Acid reflux  Fibroids  Dysmenorrhea  Prediabetes  kidney stones- Dr Alyson Ingles   Surgical History  C-Section 2002   Family History  Father: deceased  Mother: alive, diagnosed with DM, HTN  Paternal [place] Father: alive, diagnosed with DM  Paternal [place] Mother: alive, diagnosed with DM, HTN, Breast Ca  Maternal Grand Father: deceased,  diagnosed with DM  Maternal Grand Mother: alive, diagnosed with DM, HTN  Negative family hx colon cancer, colon polyps, and liver disease Paternal Great Aunt Breast Cancer.   Social History  General:  Tobacco use  cigarettes: Former smoker Quit in year 2016 Tobacco history last updated 05/06/2015 Alcohol: yes, social, beer, wine.  no Recreational drug use, no.  Exercise: yes.  Occupation: employed, works for [employer].  Marital Status: single.  Children: 1, Boys.    Gyn History  Sexual activity currently sexually active.  Periods : every month.  LMP 05/12/2015.  Birth control ocp.  Last pap smear date 11/24/2014 Neg, TMZ absent.  Denies H/O Last mammogram date.    OB History  Pregnancy # 1 live birth, C-section delivery, boy.    Allergies  N.K.D.A.   Hospitalization/Major Diagnostic Procedure  child birth x 1    Review of Systems  CONSTITUTIONAL:  no Chills. no Fever. no Night sweats.  SKIN:  no Rash. no Hives.  HEENT:  Blurrred vision no. no Double vision.  CARDIOLOGY:  no Chest pain.  RESPIRATORY:  no Shortness of breath. no Cough.  GASTROENTEROLOGY:  no Abdominal pain. no Appetite change. no Change in bowel movements.  UROLOGY:  no Urinary frequency. no Urinary incontinence. no Urinary urgency.  FEMALE REPRODUCTIVE:  no Breast lumps or discharge. no Breast pain. no Unusual vaginal discharge. no Vaginal irritation. no Vaginal itching.  NEUROLOGY:  no Dizziness. no Headache. no Loss of consciousness.  PSYCHOLOGY:  no Anxiety. no Depression.  HEMATOLOGY/LYMPH:  Using Blood Thinners no.     Examination -performed 05/19/15 General Examination: GENERAL APPEARANCE alert, oriented, NAD.  SKIN: moist, warm.  LUNGS: clear to auscultation bilaterally.  HEART: regular rate  and rhythm.  ABDOMEN: soft minimal tenderness noted diffusely,  large mass palpated in pelvis up to 1 cm above the umbilicus. Marland Kitchen  FEMALE GENITOURINARY: deferred.  MUSCULOSKELETAL  no calf tenderness bilaterally.  EXTREMITIES: no edema present.  PSYCH affect normal.   A/P: 34yo G1P1 who presents for total abdominal hysterectomy, bilateral salpingectomy due to Heavy menstrual bleeding and dysmenorrhea due to uterine fibroids. -NPO -LR @ 125cc/hr -SCDs to OR -Ancef 2g IV to OR -CBC, BMP prior to procedure -Benefit and risk reviewed including but not limited to risk of bleeding, infection and injury to surrounding organs including but not limited to bowel, bladder, ureters and ovaries.  Pt aware, questions and concerns were addressed and pt wishes to proceed.  Janyth Pupa, DO [phone] (pager) [phone] (office)

## 2015-05-19 NOTE — Patient Instructions (Signed)
Your procedure is scheduled on:  May 27, 2015    Enter through the Main Entrance of [facility] at:  6:00 am   Pick up the phone at the desk and dial [phone].  Call this number if you have problems the morning of surgery: [phone].  Remember: Do NOT eat food:  After midnight on Tuesday night  Do NOT drink clear liquids after:  Midnight on Tuesday night  Take these medicines the morning of surgery with a SIP OF WATER:  None   Do NOT wear jewelry (body piercing), metal hair clips/bobby pins, make-up, or nail polish.  Do NOT wear lotions, powders, or perfumes.  You may wear deoderant. Do NOT shave for 48 hours prior to surgery. Do NOT bring valuables to the hospital. Contacts, dentures, or bridgework may not be worn into surgery. Leave suitcase in car.  After surgery it may be brought to your room.  For patients admitted to the hospital, checkout time is 11:00 AM the day of discharge.

## 2015-05-26 NOTE — Anesthesia Preprocedure Evaluation (Addendum)
Anesthesia Evaluation  Patient identified by MRN, date of birth, ID band Patient awake    Reviewed: Allergy & Precautions, NPO status , Patient's Chart, lab work & pertinent test results  History of Anesthesia Complications Negative for: history of anesthetic complications  Airway Mallampati: II  TM Distance: >3 FB Neck ROM: Full    Dental no notable dental hx. (+) Dental Advisory Given   Pulmonary Current Smoker,  breath sounds clear to auscultation  Pulmonary exam normal       Cardiovascular negative cardio ROS Normal cardiovascular examRhythm:Regular Rate:Normal     Neuro/Psych negative neurological ROS  negative psych ROS   GI/Hepatic Neg liver ROS, GERD-  Medicated and Controlled,  Endo/Other  negative endocrine ROSobesity  Renal/GU negative Renal ROS  negative genitourinary   Musculoskeletal negative musculoskeletal ROS (+)   Abdominal   Peds negative pediatric ROS (+)  Hematology negative hematology ROS (+)   Anesthesia Other Findings   Reproductive/Obstetrics negative OB ROS                            Anesthesia Physical Anesthesia Plan  ASA: II  Anesthesia Plan: General   Post-op Pain Management:    Induction: Intravenous  Airway Management Planned: Oral ETT  Additional Equipment:   Intra-op Plan:   Post-operative Plan: Extubation in OR  Informed Consent: I have reviewed the patients History and Physical, chart, labs and discussed the procedure including the risks, benefits and alternatives for the proposed anesthesia with the patient or authorized representative who has indicated his/her understanding and acceptance.   Dental advisory given  Plan Discussed with: CRNA  Anesthesia Plan Comments:         Anesthesia Quick Evaluation

## 2015-05-27 ENCOUNTER — Inpatient Hospital Stay (HOSPITAL_COMMUNITY): Payer: BLUE CROSS/BLUE SHIELD | Admitting: Anesthesiology

## 2015-05-27 ENCOUNTER — Encounter (HOSPITAL_COMMUNITY): Payer: Self-pay | Admitting: *Deleted

## 2015-05-27 ENCOUNTER — Inpatient Hospital Stay (HOSPITAL_COMMUNITY)
Admission: RE | Admit: 2015-05-27 | Discharge: 2015-05-29 | DRG: 743 | Disposition: A | Discharge status: Home / Self Care | Payer: BLUE CROSS/BLUE SHIELD | Source: Ambulatory Visit | Attending: Obstetrics & Gynecology | Admitting: Obstetrics & Gynecology

## 2015-05-27 ENCOUNTER — Encounter (HOSPITAL_COMMUNITY)
Admission: RE | Disposition: A | Discharge status: Home / Self Care | Payer: Self-pay | Source: Ambulatory Visit | Attending: Obstetrics & Gynecology

## 2015-05-27 DIAGNOSIS — Z87442 Personal history of urinary calculi: Secondary | ICD-10-CM | POA: Diagnosis not present

## 2015-05-27 DIAGNOSIS — N946 Dysmenorrhea, unspecified: Secondary | ICD-10-CM | POA: Diagnosis present

## 2015-05-27 DIAGNOSIS — N938 Other specified abnormal uterine and vaginal bleeding: Secondary | ICD-10-CM | POA: Diagnosis present

## 2015-05-27 DIAGNOSIS — D259 Leiomyoma of uterus, unspecified: Principal | ICD-10-CM | POA: Diagnosis present

## 2015-05-27 DIAGNOSIS — Z87891 Personal history of nicotine dependence: Secondary | ICD-10-CM | POA: Diagnosis not present

## 2015-05-27 DIAGNOSIS — K219 Gastro-esophageal reflux disease without esophagitis: Secondary | ICD-10-CM | POA: Diagnosis present

## 2015-05-27 HISTORY — PX: ABDOMINAL HYSTERECTOMY: SHX81

## 2015-05-27 HISTORY — PX: BILATERAL SALPINGECTOMY: SHX5743

## 2015-05-27 LAB — PREGNANCY, URINE: Preg Test, Ur: NEGATIVE

## 2015-05-27 SURGERY — HYSTERECTOMY, ABDOMINAL
Anesthesia: General

## 2015-05-27 MED ORDER — ROCURONIUM BROMIDE 100 MG/10ML IV SOLN
INTRAVENOUS | Status: AC
Start: 2015-05-27 — End: 2015-05-27
  Filled 2015-05-27: qty 1

## 2015-05-27 MED ORDER — PHENYLEPHRINE HCL 10 MG/ML IJ SOLN
INTRAMUSCULAR | Status: DC | PRN
Start: 2015-05-27 — End: 2015-05-27
  Administered 2015-05-27: 40 ug via INTRAVENOUS

## 2015-05-27 MED ORDER — KETOROLAC TROMETHAMINE 30 MG/ML IJ SOLN
INTRAMUSCULAR | Status: DC | PRN
  Administered 2015-05-27: 30 mg via INTRAVENOUS

## 2015-05-27 MED ORDER — KETOROLAC TROMETHAMINE 30 MG/ML IJ SOLN: 30.0000 mg | Freq: Once | INTRAMUSCULAR | Status: DC

## 2015-05-27 MED ORDER — HYDROMORPHONE HCL 1 MG/ML IJ SOLN
INTRAMUSCULAR | Status: AC
  Filled 2015-05-27: qty 1

## 2015-05-27 MED ORDER — HYDROMORPHONE HCL 1 MG/ML IJ SOLN
INTRAMUSCULAR | Status: DC | PRN
  Administered 2015-05-27 (×2): 0.5 mg via INTRAVENOUS

## 2015-05-27 MED ORDER — SIMETHICONE 80 MG PO CHEW
80.0000 mg | CHEWABLE_TABLET | Freq: Four times a day (QID) | ORAL | Status: DC | PRN
  Administered 2015-05-28: 80 mg via ORAL
  Filled 2015-05-27: qty 1

## 2015-05-27 MED ORDER — GLYCOPYRROLATE 0.2 MG/ML IJ SOLN
INTRAMUSCULAR | Status: AC
  Filled 2015-05-27: qty 3

## 2015-05-27 MED ORDER — ONDANSETRON HCL 4 MG/2ML IJ SOLN
INTRAMUSCULAR | Status: AC
  Filled 2015-05-27: qty 2

## 2015-05-27 MED ORDER — ROCURONIUM BROMIDE 100 MG/10ML IV SOLN
INTRAVENOUS | Status: DC | PRN
  Administered 2015-05-27: 40 mg via INTRAVENOUS
  Administered 2015-05-27: 10 mg via INTRAVENOUS

## 2015-05-27 MED ORDER — CEFAZOLIN SODIUM-DEXTROSE 2-3 GM-% IV SOLR
INTRAVENOUS | Status: AC
  Filled 2015-05-27: qty 50

## 2015-05-27 MED ORDER — MIDAZOLAM HCL 2 MG/2ML IJ SOLN
INTRAMUSCULAR | Status: AC
  Filled 2015-05-27: qty 2

## 2015-05-27 MED ORDER — PROPOFOL 10 MG/ML IV BOLUS
INTRAVENOUS | Status: DC | PRN
  Administered 2015-05-27: 200 mg via INTRAVENOUS

## 2015-05-27 MED ORDER — MIDAZOLAM HCL 2 MG/2ML IJ SOLN
INTRAMUSCULAR | Status: DC | PRN
  Administered 2015-05-27: 2 mg via INTRAVENOUS

## 2015-05-27 MED ORDER — CEFAZOLIN SODIUM-DEXTROSE 2-3 GM-% IV SOLR
2.0000 g | INTRAVENOUS | Status: AC
  Administered 2015-05-27: 2 g via INTRAVENOUS

## 2015-05-27 MED ORDER — BISACODYL 5 MG PO TBEC
5.0000 mg | DELAYED_RELEASE_TABLET | Freq: Every day | ORAL | Status: DC | PRN
  Administered 2015-05-28: 5 mg via ORAL
  Filled 2015-05-27 (×2): qty 1

## 2015-05-27 MED ORDER — NEOSTIGMINE METHYLSULFATE 10 MG/10ML IV SOLN
INTRAVENOUS | Status: DC | PRN
  Administered 2015-05-27: 3 mg via INTRAVENOUS

## 2015-05-27 MED ORDER — FENTANYL CITRATE (PF) 100 MCG/2ML IJ SOLN
INTRAMUSCULAR | Status: DC | PRN
  Administered 2015-05-27 (×2): 50 ug via INTRAVENOUS
  Administered 2015-05-27: 100 ug via INTRAVENOUS
  Administered 2015-05-27 (×3): 50 ug via INTRAVENOUS
  Administered 2015-05-27: 100 ug via INTRAVENOUS
  Administered 2015-05-27: 50 ug via INTRAVENOUS

## 2015-05-27 MED ORDER — ONDANSETRON HCL 4 MG/2ML IJ SOLN: 4.0000 mg | Freq: Four times a day (QID) | INTRAMUSCULAR | Status: DC | PRN

## 2015-05-27 MED ORDER — SCOPOLAMINE 1 MG/3DAYS TD PT72
1.0000 | MEDICATED_PATCH | Freq: Once | TRANSDERMAL | Status: DC
  Administered 2015-05-27: 1.5 mg via TRANSDERMAL

## 2015-05-27 MED ORDER — SCOPOLAMINE 1 MG/3DAYS TD PT72
MEDICATED_PATCH | TRANSDERMAL | Status: AC
  Administered 2015-05-27: 1.5 mg via TRANSDERMAL
  Filled 2015-05-27: qty 1

## 2015-05-27 MED ORDER — LACTATED RINGERS IV SOLN: INTRAVENOUS | Status: DC

## 2015-05-27 MED ORDER — LACTATED RINGERS IV SOLN
INTRAVENOUS | Status: DC
  Administered 2015-05-27 – 2015-05-28 (×2): via INTRAVENOUS

## 2015-05-27 MED ORDER — DIPHENHYDRAMINE HCL 50 MG/ML IJ SOLN
12.5000 mg | Freq: Four times a day (QID) | INTRAMUSCULAR | Status: DC | PRN
  Administered 2015-05-27: 12.5 mg via INTRAVENOUS
  Filled 2015-05-27: qty 1

## 2015-05-27 MED ORDER — ONDANSETRON HCL 4 MG/2ML IJ SOLN
INTRAMUSCULAR | Status: DC | PRN
  Administered 2015-05-27: 4 mg via INTRAVENOUS

## 2015-05-27 MED ORDER — HYDROMORPHONE 0.3 MG/ML IV SOLN
INTRAVENOUS | Status: DC
  Administered 2015-05-27: 14:00:00 via INTRAVENOUS
  Administered 2015-05-27: 0.3 mg via INTRAVENOUS
  Administered 2015-05-27: 2.1 mg via INTRAVENOUS
  Administered 2015-05-28 (×2): 0.6 mg via INTRAVENOUS
  Administered 2015-05-28: 1.3 mg via INTRAVENOUS
  Filled 2015-05-27 (×2): qty 25

## 2015-05-27 MED ORDER — DEXAMETHASONE SODIUM PHOSPHATE 4 MG/ML IJ SOLN
INTRAMUSCULAR | Status: AC
  Filled 2015-05-27: qty 1

## 2015-05-27 MED ORDER — LACTATED RINGERS IV SOLN
INTRAVENOUS | Status: DC
  Administered 2015-05-27 (×3): via INTRAVENOUS

## 2015-05-27 MED ORDER — HYDROMORPHONE HCL 1 MG/ML IJ SOLN
INTRAMUSCULAR | Status: AC
  Administered 2015-05-27: 0.5 mg via INTRAVENOUS
  Filled 2015-05-27: qty 1

## 2015-05-27 MED ORDER — SUCCINYLCHOLINE CHLORIDE 20 MG/ML IJ SOLN
INTRAMUSCULAR | Status: DC | PRN
  Administered 2015-05-27: 120 mg via INTRAVENOUS

## 2015-05-27 MED ORDER — PANTOPRAZOLE SODIUM 40 MG PO TBEC
40.0000 mg | DELAYED_RELEASE_TABLET | Freq: Every day | ORAL | Status: DC
  Administered 2015-05-28: 40 mg via ORAL
  Filled 2015-05-27: qty 1

## 2015-05-27 MED ORDER — FENTANYL CITRATE (PF) 100 MCG/2ML IJ SOLN: 25.0000 ug | INTRAMUSCULAR | Status: DC | PRN

## 2015-05-27 MED ORDER — FENTANYL CITRATE (PF) 250 MCG/5ML IJ SOLN
INTRAMUSCULAR | Status: AC
  Filled 2015-05-27: qty 5

## 2015-05-27 MED ORDER — KETOROLAC TROMETHAMINE 30 MG/ML IJ SOLN
INTRAMUSCULAR | Status: AC
  Filled 2015-05-27: qty 1

## 2015-05-27 MED ORDER — DIPHENHYDRAMINE HCL 12.5 MG/5ML PO ELIX: 12.5000 mg | ORAL_SOLUTION | Freq: Four times a day (QID) | ORAL | Status: DC | PRN

## 2015-05-27 MED ORDER — HYDROMORPHONE HCL 1 MG/ML IJ SOLN
0.2500 mg | INTRAMUSCULAR | Status: DC | PRN
  Administered 2015-05-27 (×3): 0.5 mg via INTRAVENOUS

## 2015-05-27 MED ORDER — KETOROLAC TROMETHAMINE 30 MG/ML IJ SOLN: 30.0000 mg | Freq: Four times a day (QID) | INTRAMUSCULAR | Status: DC

## 2015-05-27 MED ORDER — DEXAMETHASONE SODIUM PHOSPHATE 10 MG/ML IJ SOLN
INTRAMUSCULAR | Status: DC | PRN
  Administered 2015-05-27: 4 mg via INTRAVENOUS

## 2015-05-27 MED ORDER — LIDOCAINE HCL (CARDIAC) 20 MG/ML IV SOLN
INTRAVENOUS | Status: DC | PRN
  Administered 2015-05-27: 80 mg via INTRAVENOUS

## 2015-05-27 MED ORDER — ONDANSETRON HCL 4 MG PO TABS: 4.0000 mg | ORAL_TABLET | Freq: Four times a day (QID) | ORAL | Status: DC | PRN

## 2015-05-27 MED ORDER — PNEUMOCOCCAL VAC POLYVALENT 25 MCG/0.5ML IJ INJ
0.5000 mL | INJECTION | INTRAMUSCULAR | Status: DC
  Filled 2015-05-27: qty 0.5

## 2015-05-27 MED ORDER — ACETAMINOPHEN 10 MG/ML IV SOLN
1000.0000 mg | Freq: Once | INTRAVENOUS | Status: AC
  Administered 2015-05-27: 1000 mg via INTRAVENOUS
  Filled 2015-05-27: qty 100

## 2015-05-27 MED ORDER — SODIUM CHLORIDE 0.9 % IJ SOLN: 9.0000 mL | INTRAMUSCULAR | Status: DC | PRN

## 2015-05-27 MED ORDER — NALOXONE HCL 0.4 MG/ML IJ SOLN: 0.4000 mg | INTRAMUSCULAR | Status: DC | PRN

## 2015-05-27 MED ORDER — ONDANSETRON HCL 4 MG/2ML IJ SOLN: 4.0000 mg | Freq: Once | INTRAMUSCULAR | Status: DC | PRN

## 2015-05-27 MED ORDER — HYDROMORPHONE HCL 1 MG/ML IJ SOLN
0.2000 mg | INTRAMUSCULAR | Status: DC | PRN
  Administered 2015-05-27: 0.6 mg via INTRAVENOUS
  Filled 2015-05-27: qty 1

## 2015-05-27 MED ORDER — KETOROLAC TROMETHAMINE 30 MG/ML IJ SOLN
30.0000 mg | Freq: Four times a day (QID) | INTRAMUSCULAR | Status: DC
  Administered 2015-05-27 – 2015-05-28 (×3): 30 mg via INTRAVENOUS
  Filled 2015-05-27 (×3): qty 1

## 2015-05-27 MED ORDER — OXYCODONE-ACETAMINOPHEN 5-325 MG PO TABS
1.0000 | ORAL_TABLET | ORAL | Status: DC | PRN
  Administered 2015-05-28 (×2): 2 via ORAL
  Administered 2015-05-29: 1 via ORAL
  Filled 2015-05-27 (×2): qty 2
  Filled 2015-05-27: qty 1

## 2015-05-27 MED ORDER — GLYCOPYRROLATE 0.2 MG/ML IJ SOLN
INTRAMUSCULAR | Status: DC | PRN
  Administered 2015-05-27: 0.4 mg via INTRAVENOUS

## 2015-05-27 MED ORDER — MENTHOL 3 MG MT LOZG
1.0000 | LOZENGE | OROMUCOSAL | Status: DC | PRN
  Administered 2015-05-27: 3 mg via ORAL
  Filled 2015-05-27: qty 9

## 2015-05-27 MED ORDER — DOCUSATE SODIUM 100 MG PO CAPS
100.0000 mg | ORAL_CAPSULE | Freq: Two times a day (BID) | ORAL | Status: DC
  Administered 2015-05-27 – 2015-05-28 (×3): 100 mg via ORAL
  Filled 2015-05-27 (×3): qty 1

## 2015-05-27 MED ORDER — LABETALOL HCL 5 MG/ML IV SOLN
INTRAVENOUS | Status: DC | PRN
  Administered 2015-05-27: 5 mg via INTRAVENOUS

## 2015-05-27 MED ORDER — NEOSTIGMINE METHYLSULFATE 10 MG/10ML IV SOLN
INTRAVENOUS | Status: AC
  Filled 2015-05-27: qty 1

## 2015-05-27 MED ORDER — LIDOCAINE HCL (CARDIAC) 20 MG/ML IV SOLN
INTRAVENOUS | Status: AC
  Filled 2015-05-27: qty 5

## 2015-05-27 MED ORDER — PROPOFOL 10 MG/ML IV BOLUS
INTRAVENOUS | Status: AC
  Filled 2015-05-27: qty 20

## 2015-05-27 SURGICAL SUPPLY — 39 items
APPLICATOR COTTON TIP 6IN STRL (MISCELLANEOUS) ×3 IMPLANT
BENZOIN TINCTURE PRP APPL 2/3 (GAUZE/BANDAGES/DRESSINGS) ×3 IMPLANT
CANISTER SUCT 3000ML (MISCELLANEOUS) ×3 IMPLANT
CLOTH BEACON ORANGE TIMEOUT ST (SAFETY) ×3 IMPLANT
CONT PATH 16OZ SNAP LID 3702 (MISCELLANEOUS) ×3 IMPLANT
CONT SPEC PATH 64OZ SNAP LID (MISCELLANEOUS) ×3 IMPLANT
DRAPE WARM FLUID 44X44 (DRAPE) IMPLANT
DRSG OPSITE POSTOP 4X10 (GAUZE/BANDAGES/DRESSINGS) ×3 IMPLANT
DURAPREP 26ML APPLICATOR (WOUND CARE) ×3 IMPLANT
GAUZE SPONGE 4X4 16PLY XRAY LF (GAUZE/BANDAGES/DRESSINGS) ×3 IMPLANT
GLOVE BIOGEL M 6.5 STRL (GLOVE) ×6 IMPLANT
GLOVE BIOGEL PI IND STRL 6.5 (GLOVE) ×2 IMPLANT
GLOVE BIOGEL PI INDICATOR 6.5 (GLOVE) ×1
GLOVE ECLIPSE 6.5 STRL STRAW (GLOVE) ×9 IMPLANT
GOWN STRL REUS W/TWL LRG LVL3 (GOWN DISPOSABLE) ×9 IMPLANT
HEMOSTAT SURGICEL 4X8 (HEMOSTASIS) ×3 IMPLANT
NS IRRIG 1000ML POUR BTL (IV SOLUTION) ×3 IMPLANT
PACK ABDOMINAL GYN (CUSTOM PROCEDURE TRAY) ×3 IMPLANT
PAD OB MATERNITY 4.3X12.25 (PERSONAL CARE ITEMS) ×3 IMPLANT
PROTECTOR NERVE ULNAR (MISCELLANEOUS) ×3 IMPLANT
SPONGE LAP 18X18 X RAY DECT (DISPOSABLE) ×12 IMPLANT
STRIP CLOSURE SKIN 1/2X4 (GAUZE/BANDAGES/DRESSINGS) ×3 IMPLANT
SUT PLAIN 2 0 XLH (SUTURE) ×3 IMPLANT
SUT VIC AB 0 CT1 18XCR BRD8 (SUTURE) ×4 IMPLANT
SUT VIC AB 0 CT1 27 (SUTURE) ×1
SUT VIC AB 0 CT1 27XCR 8 STRN (SUTURE) ×2 IMPLANT
SUT VIC AB 0 CT1 36 (SUTURE) ×3 IMPLANT
SUT VIC AB 0 CT1 8-18 (SUTURE) ×2
SUT VIC AB 0 CTB1 27 (SUTURE) ×6 IMPLANT
SUT VIC AB 2-0 CT1 (SUTURE) IMPLANT
SUT VIC AB 2-0 CT1 27 (SUTURE) ×2
SUT VIC AB 2-0 CT1 TAPERPNT 27 (SUTURE) ×4 IMPLANT
SUT VIC AB 2-0 SH 27 (SUTURE)
SUT VIC AB 2-0 SH 27XBRD (SUTURE) IMPLANT
SUT VIC AB 4-0 KS 27 (SUTURE) ×3 IMPLANT
SUT VICRYL 0 TIES 12 18 (SUTURE) ×3 IMPLANT
TOWEL OR 17X24 6PK STRL BLUE (TOWEL DISPOSABLE) ×6 IMPLANT
TRAY FOLEY CATH SILVER 14FR (SET/KITS/TRAYS/PACK) ×3 IMPLANT
WATER STERILE IRR 1000ML POUR (IV SOLUTION) ×3 IMPLANT

## 2015-05-27 NOTE — Interval H&P Note (Signed)
History and Physical Interval Note:  05/27/2015 6:57 AM  [place]  has presented today for surgery, with the diagnosis of D25.9  Fibroids  The various methods of treatment have been discussed with the patient and family. After consideration of risks, benefits and other options for treatment, the patient has consented to  Procedure(s): HYSTERECTOMY ABDOMINAL (N/A) BILATERAL SALPINGECTOMY (Bilateral) as a surgical intervention .  The patient's history has been reviewed, patient examined, no change in status, stable for surgery.  I have reviewed the patient's chart and labs.  Questions were answered to the patient's satisfaction.     Janyth Pupa, M

## 2015-05-27 NOTE — Addendum Note (Signed)
Addendum  created 05/27/15 1426 by Flossie Dibble, CRNA   Modules edited: Notes Section   Notes Section:  File: [id number]

## 2015-05-27 NOTE — Transfer of Care (Signed)
Immediate Anesthesia Transfer of Care Note  Patient: Maria [name]  Procedure(s) Performed: Procedure(s) with comments: HYSTERECTOMY ABDOMINAL (N/A) - 669.1g  BILATERAL SALPINGECTOMY (Bilateral)  Patient Location: PACU  Anesthesia Type:General  Level of Consciousness: awake, alert  and oriented  Airway & Oxygen Therapy: Patient Spontanous Breathing and Patient connected to nasal cannula oxygen  Post-op Assessment: Report given to RN, Post -op Vital signs reviewed and stable and Patient moving all extremities  Post vital signs: Reviewed and stable  Last Vitals:  Filed Vitals:   05/27/15 0610  BP: 120/76  Pulse: 69  Temp: 36.8 C  Resp: 18    Complications: No apparent anesthesia complications

## 2015-05-27 NOTE — Anesthesia Postprocedure Evaluation (Signed)
  Anesthesia Post-op Note  Patient: Maria [name]  Procedure(s) Performed: Procedure(s) (LRB): HYSTERECTOMY ABDOMINAL (N/A) BILATERAL SALPINGECTOMY (Bilateral)  Patient Location: PACU  Anesthesia Type: General  Level of Consciousness: awake and alert   Airway and Oxygen Therapy: Patient Spontanous Breathing  Post-op Pain: mild  Post-op Assessment: Post-op Vital signs reviewed, Patient's Cardiovascular Status Stable, Respiratory Function Stable, Patent Airway and No signs of Nausea or vomiting  Last Vitals:  Filed Vitals:   05/27/15 1100  BP: 120/70  Pulse: 87  Temp:   Resp: 16    Post-op Vital Signs: stable   Complications: No apparent anesthesia complications

## 2015-05-27 NOTE — Anesthesia Postprocedure Evaluation (Signed)
Anesthesia Post Note  Patient: Maria [name]  Procedure(s) Performed: Procedure(s) with comments: HYSTERECTOMY ABDOMINAL (N/A) - 669.1g  BILATERAL SALPINGECTOMY (Bilateral)  Anesthesia type: General  Patient location: Women's Unit  Post pain: Pain being addressed with PCA @ 14:20  Post assessment: Post-op Vital signs reviewed  Last Vitals: BP 119/66 mmHg  Pulse 95  Temp(Src) 36.9 C (Oral)  Resp 16  Ht 4\' 10"  (1.473 m)  Wt 163 lb (73.936 kg)  BMI 34.08 kg/m2  SpO2 99%  Post vital signs: Reviewed  Level of consciousness: awake  Complications: No apparent anesthesia complications

## 2015-05-27 NOTE — Anesthesia Procedure Notes (Signed)
Procedure Name: Intubation Date/Time: 05/27/2015 7:26 AM Performed by: Hewitt Blade Pre-anesthesia Checklist: Patient identified, Emergency Drugs available, Suction available and Patient being monitored Patient Re-evaluated:Patient Re-evaluated prior to inductionOxygen Delivery Method: Circle system utilized Preoxygenation: Pre-oxygenation with 100% oxygen Intubation Type: IV induction Ventilation: Mask ventilation without difficulty Laryngoscope Size: Mac and 3 Grade View: Grade I Tube type: Oral Tube size: 7.0 mm Number of attempts: 1 Airway Equipment and Method: Stylet Placement Confirmation: ETT inserted through vocal cords under direct vision,  positive ETCO2 and breath sounds checked- equal and bilateral Secured at: 21 cm Tube secured with: Tape Dental Injury: Teeth and Oropharynx as per pre-operative assessment

## 2015-05-27 NOTE — Op Note (Signed)
Preop Dx: Abnormal uterine bleeding, Uterine fibroids, Dysmenorrhea Postop Dx: same Procedure: Total abdominal hysterectomy, bilateral salpingectomy, lysis of adhesions Surgeon: Dr. Janyth Pupa Assistant: Dr. Christophe Louis Anesthesia: general Complications: none EBL: 250cc Fluids: 1700cc UOP: 100cc  Findings: Large 17wk sized boggy lobular uterus, normal fallopian tubes and ovaries bilaterally.  Filmy adhesions noted between the uterus and pelvic side wall (left side more than right).  Small thick adhesion noted between anterior portion of uterus and peritoneum.  Specimens: Uterus with cervix and bilateral fallopian tubes  Procedure:  The patient was taken to the operating room where a general anesthetic was given. A timeout was performed confirming the appropriate procedure. The patient's abdomen was prepped with DuraPrep. Her perineum and vagina were prepped with Betadine. A Foley catheter was placed in the bladder. The patient was sterilely draped. A low transverse incision was made in the lower abdomen and carried sharply through the subcutaneous tissue to the fascia. The fascia was incised and extended laterally. The inferior aspect of the fascia was grasped with Kocher clamps and the underlying rectus muscle was dissected off sharply. In a similar fashion, the superior aspect of the fascia was elevated with Kocher clamps and the rectus muscle was dissected off. Hemostasis was achieved with the bovie. The rectus muscle was separated in the midline and the peritoneum was exposed. The peritoneum was entered sharply and the incision was extended superiorly and inferiorly with the Mezenbaum scissors. Adhesions were noted between the uterus and and peritoneum that were clamped, cut and suture ligated. Intraabdominal surveillance revealed the findings as mentioned above.  Some adhesions were noted between the side wall and uterus and were taken down with sharp dissection.  The  O'Conner-O'Sullivan retractor was placed, the bowel packed away using moist sponges. The round ligament was identified on the right. The round ligament was suture ligated and cut and the incision was carried down to create the vesicouterine flap. The right fallopian tube was serially clamped, cut and suture ligated to the cornua. A window was created in an avascular area of the posterior leaf. The ovarian ligament was clamped, cut and doubly ligated. An identical procedure was carried out on the opposite side. Adhesions between the uterus and bladder were noted and taken down with both sharp and blunt dissection and the bladder flap was created.  The uterine arteries were skeletonized, doubly clamped, cut and suture ligated. The body of the uterus was then removed to allow for better visualization. The cardinal ligaments and uterosacral ligaments were clamped, cut and suture ligated. Curved heany clamps were placed at the angles of the vagina, the cervix was removed from the vaginal cuff with scissors. The vaginal cuff was closed with a continuous-running suture. The pelvis was irrigated. Hemostasis was confirmed.  Surgicel was placed over the cuff and adnexa bilaterally.  All instruments and packs were removed from the abdominal cavity.  The rectus sheath was reinforced with single interrupted stitches x2.  The fascia was closed using a running suture. The skin was reapproximated using 4-0 vicryl on a Keith needle.  Sponge, needle, and instrument counts were correct. The patient tolerated the procedure well. She was awakened from her anesthetic without difficulty. She was transported to the recovery room in stable condition. She was noted to drain clear yellow urine. All specimens were sent to pathology. Dr. Landry Mellow was present due to the complex nature of this case and that no residents are available to our service.  Janyth Pupa, DO [phone] (pager) [phone] (office)

## 2015-05-28 ENCOUNTER — Encounter (HOSPITAL_COMMUNITY): Payer: Self-pay | Admitting: Obstetrics & Gynecology

## 2015-05-28 LAB — BASIC METABOLIC PANEL
Anion gap: 3 — ABNORMAL LOW (ref 5–15)
BUN: 9 mg/dL (ref 6–20)
CO2: 28 mmol/L (ref 22–32)
Calcium: 7.9 mg/dL — ABNORMAL LOW (ref 8.9–10.3)
Chloride: 104 mmol/L (ref 101–111)
Creatinine, Ser: 0.8 mg/dL (ref 0.44–1.00)
GFR calc Af Amer: >60 mL/min (ref >60)
GLUCOSE: 148 mg/dL — AB (ref 65–99)
Potassium: 3.9 mmol/L (ref 3.5–5.1)
SODIUM: 135 mmol/L (ref 135–145)

## 2015-05-28 LAB — CBC
HEMATOCRIT: 32.6 % — AB (ref 36.0–46.0)
HEMOGLOBIN: 10.9 g/dL — AB (ref 12.0–15.0)
MCH: 27.9 pg (ref 26.0–34.0)
MCHC: 33.4 g/dL (ref 30.0–36.0)
MCV: 83.6 fL (ref 78.0–100.0)
Platelets: 165 10*3/uL (ref 150–400)
RBC: 3.9 MIL/uL (ref 3.87–5.11)
RDW: 14.5 % (ref 11.5–15.5)
WBC: 11 10*3/uL — ABNORMAL HIGH (ref 4.0–10.5)

## 2015-05-28 MED ORDER — IBUPROFEN 600 MG PO TABS
600.0000 mg | ORAL_TABLET | Freq: Four times a day (QID) | ORAL | Status: DC | PRN
  Administered 2015-05-28: 600 mg via ORAL
  Filled 2015-05-28: qty 1

## 2015-05-28 NOTE — Progress Notes (Signed)
Postop Note Day # 1  S:  Patient resting comfortable in bed.  Pain well controlled.  Tolerating sip/chips. No flatus, no BM.  Denies vaginal bleeding.  Ambulating without difficulty.  She denies n/v/f/c, SOB, or CP.    O: Temp:  [97.8 F (36.6 C)-99.7 F (37.6 C)] 99.1 F (37.3 C) (07/28 0526) Pulse Rate:  [77-139] 77 (07/28 0526) Resp:  [10-30] 17 (07/28 0526) BP: (98-137)/(57-84) 98/66 mmHg (07/28 0526) SpO2:  [96 %-100 %] 100 % (07/28 0526) Weight:  [73.936 kg (163 lb)] 73.936 kg (163 lb) (07/27 1200)  UOP: 1575cc/8hr  Gen: A&Ox3, NAD CV: RRR, no MRG Resp: CTAB Abdomen: soft, NT, ND +BS Incision: c/d/i, bandage on, honeycomb dressing in place Ext: No edema, no calf tenderness bilaterally, SCDs in place  Labs:  Results for orders placed or performed during the hospital encounter of 05/27/15 (from the past 24 hour(s))  CBC     Status: Abnormal   Collection Time: 05/28/15  5:32 AM  Result Value Ref Range   WBC 11.0 (H) 4.0 - 10.5 K/uL   RBC 3.90 3.87 - 5.11 MIL/uL   Hemoglobin 10.9 (L) 12.0 - 15.0 g/dL   HCT 32.6 (L) 36.0 - 46.0 %   MCV 83.6 78.0 - 100.0 fL   MCH 27.9 26.0 - 34.0 pg   MCHC 33.4 30.0 - 36.0 g/dL   RDW 14.5 11.5 - 15.5 %   Platelets 165 150 - 400 K/uL  Basic metabolic panel     Status: Abnormal   Collection Time: 05/28/15  5:32 AM  Result Value Ref Range   Sodium 135 135 - 145 mmol/L   Potassium 3.9 3.5 - 5.1 mmol/L   Chloride 104 101 - 111 mmol/L   CO2 28 22 - 32 mmol/L   Glucose, Bld 148 (H) 65 - 99 mg/dL   BUN 9 6 - 20 mg/dL   Creatinine, Ser 0.80 0.44 - 1.00 mg/dL   Calcium 7.9 (L) 8.9 - 10.3 mg/dL   GFR calc non Af Amer >60 >60 mL/min   GFR calc Af Amer >60 >60 mL/min   Anion gap 3 (L) 5 - 15    A/P: Pt is a 34 y.o. G1P1 s/p TAH, BS, lysis of adhesion, POD #1  - Pain well controlled, will transition to oral Percocet and Motrin today -GU: UOP is adequate, Foley removed this am -GI: Advance to general diet as tolerated -Activity: encouraged  sitting up to chair and ambulation as tolerated -Prophylaxis: SCDs while in bed, early ambulation -Labs: stable as above -GERD: Protonix twice daily  Continue with routine postoperative care  Maria Pupa, DO [phone] (pager) [phone] (office)

## 2015-05-29 MED ORDER — OXYCODONE-ACETAMINOPHEN 5-325 MG PO TABS: 1.0000 | ORAL_TABLET | Freq: Four times a day (QID) | ORAL | Status: DC | PRN

## 2015-05-29 MED ORDER — IBUPROFEN 600 MG PO TABS: 600.0000 mg | ORAL_TABLET | Freq: Four times a day (QID) | ORAL | Status: DC | PRN

## 2015-05-29 MED ORDER — DOCUSATE SODIUM 100 MG PO CAPS: 100.0000 mg | ORAL_CAPSULE | Freq: Two times a day (BID) | ORAL | Status: DC

## 2015-05-29 NOTE — Progress Notes (Signed)
Postop Note Day # 2  S:  Patient resting comfortable in bed.  Pain well controlled.  Tolerating general diet.  + flatus, no BM.  Denies vaginal bleeding.  Ambulating without difficulty.  She denies n/v/f/c, SOB, or CP.    O: Temp:  [98.3 F (36.8 C)-99.2 F (37.3 C)] 99.2 F (37.3 C) (07/29 0544) Pulse Rate:  [72-106] 90 (07/29 0544) Resp:  [18-20] 20 (07/29 0544) BP: (100-124)/(59-79) 112/59 mmHg (07/29 0544) SpO2:  [95 %-100 %] 100 % (07/29 0544)   Gen: A&Ox3, NAD CV: RRR, no MRG Resp: CTAB Abdomen: soft, NT, ND +BS Incision: c/d/i, bandage on, honeycomb dressing in place Ext: No edema, no calf tenderness bilaterally, SCDs in place   A/P: Pt is a 34 y.o. G1P1 s/p TAH, BS, lysis of adhesion, POD #2  - Pain well controlled, with oral pain management -GU: Voiding freely -GI: Tolerating general diet -Activity: encouraged sitting up to chair and ambulation as tolerated -Prophylaxis: SCDs while in bed, early ambulation -Labs: stable -GERD: Protonix twice daily  Meeting postoperative milestones appropriately, plan for discharge home today.  Janyth Pupa, DO [phone] (pager) [phone] (office)

## 2015-05-29 NOTE — Discharge Summary (Addendum)
Physician Discharge Summary  Patient ID: Maria [name] MRN: [id number] DOB/AGE: Feb 18, 1981 35 y.o.  Admit date: 05/27/2015 Discharge date: 05/29/2015  Admission Diagnoses: Abnormal uterine bleeding, uterine leiomyoma, dysmenorrhea  Discharge Diagnoses: same Discharged Condition: stable  Hospital Course: 34 y/o G1P1 who presented for scheduled procedure due to Abnormal uterine bleeding/Heavy menstrual bleeding and dysmenorrhea due to uterine fibroids. She underwent a Total abdominal hysterectomy, bilateral salpingectomy, lysis of adhesions performed on 7/27, for further information please see the operative report.  Her postoperative course was uncomplicated as she met all of her milestones appropriately.  She was discharged home in stable condition on POD#2.  Consults: None  Significant Diagnostic Studies: labs:  CBC Latest Ref Rng 05/28/2015 05/19/2015 04/02/2015  WBC 4.0 - 10.5 K/uL 11.0(H) 6.4 6.9  Hemoglobin 12.0 - 15.0 g/dL 10.9(L) 12.9 13.2  Hematocrit 36.0 - 46.0 % 32.6(L) 37.8 38.9  Platelets 150 - 400 K/uL 165 205 208     Treatments: IV hydration, antibiotics: Ancef, analgesia: Dilaudid and Percocet, Motrin and surgery: Total abdominal hysterectomy, bilateral salpingectomy, lysis of adhesions   Discharge Exam: Blood pressure 112/59, pulse 90, temperature 99.2 F (37.3 C), temperature source Oral, resp. rate 20, height _0  (1.473 m), weight 73.936 kg (163 lb), SpO2 100 %. Gen: A&Ox3, NAD CV: RRR, no MRG Resp: CTAB Abdomen: soft, NT, ND +BS Incision: c/d/i, bandage on, honeycomb dressing in place Ext: No edema, no calf tenderness bilaterally, SCDs in place  Disposition: 01-Home or Self Care     Medication List    STOP taking these medications        norethindrone-ethinyl estradiol 1-20 MG-MCG tablet  Commonly known as:  JUNEL FE,GILDESS FE,LOESTRIN FE      TAKE these medications        docusate sodium 100 MG capsule  Commonly known as:  COLACE  Take 1  capsule (100 mg total) by mouth 2 (two) times daily.     ibuprofen 600 MG tablet  Commonly known as:  ADVIL,MOTRIN  Take 1 tablet (600 mg total) by mouth every 6 (six) hours as needed for mild pain or moderate pain.     meloxicam 7.5 MG tablet  Commonly known as:  MOBIC  Take 1 tablet (7.5 mg total) by mouth daily.     oxyCODONE-acetaminophen 5-325 MG per tablet  Commonly known as:  PERCOCET/ROXICET  Take 1-2 tablets by mouth every 6 (six) hours as needed (moderate to severe pain (when tolerating fluids)).           Follow-up Information    Follow up with Janyth Pupa, M, DO In 2 weeks.   Specialty:  Obstetrics and Gynecology   Contact information:   [address] [place] [address] [phone]       Signed: Annalee Genta 05/29/2015, 1:33 PM

## 2015-05-29 NOTE — Discharge Instructions (Signed)
Abdominal Hysterectomy, Care After Refer to this sheet in the next few weeks. These instructions provide you with information on caring for yourself after your procedure. Your health care provider may also give you more specific instructions. Your treatment has been planned according to current medical practices, but problems sometimes occur. Call your health care provider if you have any problems or questions after your procedure.  WHAT TO EXPECT AFTER THE PROCEDURE After your procedure, it is typical to have the following:  Pain.  Feeling tired.  Poor appetite.  Less interest in sex. HOME CARE INSTRUCTIONS  It takes 4-6 weeks to recover from this surgery. Make sure you follow all your health care provider's instructions. Home care instructions may include:  Take pain medicines only as directed by your health care provider.  You may alternate between Percocet and Motrin as needed. Percocet may cause constipation, so take the Motrin first, if still having significant pain in 2-3 hours, take the Percocet.  To help with constipation, while taking the Percocet, please continue with stool softener (colace) twice daily. Do not take over-the-counter pain medicines without checking with your health care provider first.  You may remove the honeycomb dressing within a few days.  Take showers instead of baths for 2-3 weeks. Ask your health care provider when it is safe to start showering.  Do not douche, use tampons, or have sexual intercourse for at least 6 weeks or until your health care provider says you can.   Follow your health care provider's advice about exercise, lifting, driving, and general activities.  Get plenty of rest and sleep.   Do not lift anything heavier than a gallon of milk (about 10-15 lb [4.5 kg]) for the first month after surgery.  You can resume your normal diet if your health care provider says it is okay.   Do not drink alcohol until your health care provider says  you can.   If you are constipated, ask your health care provider if you can take a mild laxative.  Eating foods high in fiber may also help with constipation. Eat plenty of raw fruits and vegetables, whole grains, and beans.  Drink enough fluids to keep your urine clear or pale yellow.   Try to have someone at home with you for the first 1-2 weeks to help around the house.  Keep all follow-up appointments. SEEK MEDICAL CARE IF:   You have chills or fever.  You have swelling, redness, or pain in the area of your incision that is getting worse.   You have pus coming from the incision.   You notice a bad smell coming from the incision or bandage.   Your incision breaks open.   You feel dizzy or light-headed.   You have pain or bleeding when you urinate.   You have persistent diarrhea.   You have persistent nausea and vomiting.   You have abnormal vaginal discharge.   You have a rash.   You have any type of abnormal reaction or develop an allergy to your medicine.   Your pain medicine is not helping.  SEEK IMMEDIATE MEDICAL CARE IF:   You have a fever and your symptoms suddenly get worse.  You have severe abdominal pain.  You have chest pain.  You have shortness of breath.  You faint.  You have pain, swelling, or redness of your leg.  You have heavy vaginal bleeding with blood clots. MAKE SURE YOU:  Understand these instructions.  Will watch your condition.  Will get help right away if you are not doing well or get worse. Document Released: 05/06/2005 Document Revised: 10/22/2013 Document Reviewed: 08/09/2013 [facility] Patient Information 2015 [place], [place]. This information is not intended to replace advice given to you by your health care provider. Make sure you discuss any questions you have with your health care provider.

## 2015-05-29 NOTE — Progress Notes (Signed)
Pt is discharged in the care of children. With R.N. Judd Lien. Denies any pain or discomfort.Discharged instructions with Rx were given to pt with good understanding Questions were asked and answered. Abdominal honey comb  Rx were given. Abdominal honeycomb dressing is clean and dry. Stable.No equipment needed for home.

## 2015-07-03 ENCOUNTER — Encounter (HOSPITAL_COMMUNITY): Payer: Self-pay | Admitting: *Deleted

## 2015-07-08 NOTE — H&P (Signed)
Chief Complaint Chief Complaints  1. Flank Pain  History of Present Illness Maria [name] is 34yo seen in followup for nephrolithiasis. She is s/p hysterectomy 4 weeks ago. Her pelvic pain has improved significantly . She still has intermittent sharp, mild nonradiating flank pain worse on the left. She believes this is musculoskeletal in origin. The pain is worse with movement and exercise. She has been using ibuprofen which has helped with the pain.   Past Medical History Problems  1. History of esophageal reflux (Z87.19) 2. History of Prediabetes (R73.09)  Surgical History Problems  1. History of Cesarean Section 2. History of Hysterectomy  Current Meds 1. TraMADol HCl TABS; prn;  Therapy: (Recorded:01Sep2016) to Recorded  Allergies Medication  1. No Known Drug Allergies  Family History Problems  1. Family history of Death of family member : Father   Dad died at age 55 from a murder  Social History Problems  1. Alcohol use (Z78.9) 2. Caffeine use (F15.90) 3. Current every day smoker (F17.200) 4. Current every day smoker (F17.200)   Has been smoking for approx 16 yrs, smokes 4 ciggs. a day 5. Number of children   1 son 6. Occupation 7. Single  Review of Systems Genitourinary, constitutional, skin, eye, otolaryngeal, hematologic/lymphatic, cardiovascular, pulmonary, endocrine, musculoskeletal, gastrointestinal, neurological and psychiatric system(s) were reviewed and pertinent findings if present are noted and are otherwise negative.    Vitals Vital Signs [Data Includes: Last 1 Day]  Recorded: 01Sep2016 10:58AM  Blood Pressure: 110 / 74 Temperature: 98.4 F Heart Rate: 73  Physical Exam Constitutional: Well nourished and well developed . No acute distress. The patient appears well hydrated.  ENT:. The ears and nose are normal in appearance. The oropharynx is normal.  Neck: The appearance of the neck is normal and no neck mass is present.  Pulmonary: No  respiratory distress . No accessory muscle use.  Cardiovascular:. The arterial pulses are normal. No peripheral edema.  Abdomen: The abdomen is mildly obese. The abdomen is soft and nontender. No masses are palpated. No CVA tenderness.  Lymphatics: The femoral and inguinal nodes are not enlarged or tender.  Skin: Normal skin turgor, no visible rash and normal skin color and pigmentation . Numerous upper extremity tattoos.  Neuro/Psych:. Mood and affect are appropriate. No focal sensory deficits.    Results/Data   Study: Renal Ultrasound    Date of Service: 07/02/2015    Indication: nephrolithiasis    Rt Kidney: The right kidney is visualized in size measuring 10.2 cm height x 3.68 cm    anterior-posterior and 4.57cm medial-lateral. cortical-medullary differentiation is    visualized with average cortex thickness of 1.72 cm. no hypoechoic lesions    identified. no hyperechoic lesions identified. no renal pelvic dilatation    (hydronehprosis) is visualized. no proximal ureteral dilation is visualized.    Lt Kidney: The left kidney is visualized in size measuring 10.07cm height x 4.85 cm    anterior-posterior and 5.23cm medial-lateral. cortical-medullary differentiation is    visualized with average cortex thickness of 1.39cm. nohypoechoic lesions    identified. 27mm hyperechoic lcalculi identified in lower pole. no renal pelvic dilatation    (hydronehprosis) is visualized. no proximal ureteral dilation is visualized.    IMPRESSION: 1. left lower pole 36mm calculus    Nicolette Bang, MD    Study: Abdominal Anterior-Posterior Radiograph    Date of Service: 07/02/2015    Indication: nephrolithiasis    Bony Structures: Visualized bony structures of the ribs, spine, and pelvis demonstrate no  large lytic or blastic lesions.    Bowel Gas: Visualized bowel gas is non-obstructive appearing.    Rt Kidney: The right kidney is shadow is  visualized. no radiodense structures    are identified in the expected location of the kidney or renal pelvis. no radiodense    structures are identified in the expected location of the ureter.    Lt Kidney: The left kidney is shadow is visualized. 45mm radiodense structures are    identified in the expected location of the lower pole. no radiodense    structures are identified in the expected location of the ureter.    Bladder Area / Pelvis: no radiodense structures are identified in the expected location    of the urinary bladder. no densities consistent with phleboliths are visualized.    IMPRESSION: Left lower pole renal calculus    Nicolette Bang, MD.    Assessment Assessed  1. Nephrolithiasis (N20.0)  Plan Health Maintenance  1. UA With REFLEX; [Do Not Release]; Status:Resulted - Requires Verification;   Done:  [id number] 10:09AM Nephrolithiasis  2. Start: Hydrocodone-Acetaminophen 5-325 MG Oral Tablet (Norco); TAKE 1 TABLET  EVERY 4 TO 6 HOURS AS NEEDED FOR PAIN 3. Start: TraMADol HCl - 50 MG Oral Tablet; TAKE 1 TABLET EVERY 4 TO 6 HOURS AS  NEEDED FOR PAIN  1. Schedule for L ESWL   Discussion/Summary We discussed shockwave lithotripsy in detail as well as my "rule of 9s" with stones    &lt;53mm, less than 900 HU, and skin to stone distance &lt;9cm having approximately 90%    treatment success with single session of treatment. We then addressed how stones that are    larger, more dense, and in patients with less favorable anatomy have incrementally    decreased success rates. We discussed risks including, bleeding, infection, hematoma,    loss of kidney, need for staged therapy, need for adjunctive therapy and requirement to    refrain from any anticoagulants, anti-platelet or aspirin-like products peri-procedureally.    After careful consideration, the patient has chosen to proceed.

## 2015-07-09 ENCOUNTER — Encounter (HOSPITAL_COMMUNITY): Payer: Self-pay

## 2015-07-09 ENCOUNTER — Encounter (HOSPITAL_COMMUNITY)
Admission: RE | Disposition: A | Discharge status: Home / Self Care | Payer: Self-pay | Source: Ambulatory Visit | Attending: Urology

## 2015-07-09 ENCOUNTER — Ambulatory Visit (HOSPITAL_COMMUNITY)
Admission: RE | Admit: 2015-07-09 | Discharge: 2015-07-09 | Disposition: A | Discharge status: Home / Self Care | Payer: BLUE CROSS/BLUE SHIELD | Source: Ambulatory Visit | Attending: Urology | Admitting: Urology

## 2015-07-09 ENCOUNTER — Ambulatory Visit (HOSPITAL_COMMUNITY): Payer: BLUE CROSS/BLUE SHIELD

## 2015-07-09 DIAGNOSIS — N132 Hydronephrosis with renal and ureteral calculous obstruction: Secondary | ICD-10-CM | POA: Diagnosis not present

## 2015-07-09 DIAGNOSIS — K219 Gastro-esophageal reflux disease without esophagitis: Secondary | ICD-10-CM | POA: Diagnosis not present

## 2015-07-09 DIAGNOSIS — R102 Pelvic and perineal pain: Secondary | ICD-10-CM | POA: Diagnosis not present

## 2015-07-09 DIAGNOSIS — F17210 Nicotine dependence, cigarettes, uncomplicated: Secondary | ICD-10-CM | POA: Insufficient documentation

## 2015-07-09 DIAGNOSIS — Z01812 Encounter for preprocedural laboratory examination: Secondary | ICD-10-CM

## 2015-07-09 DIAGNOSIS — Z90710 Acquired absence of both cervix and uterus: Secondary | ICD-10-CM | POA: Diagnosis not present

## 2015-07-09 DIAGNOSIS — N200 Calculus of kidney: Secondary | ICD-10-CM | POA: Diagnosis present

## 2015-07-09 SURGERY — LITHOTRIPSY, ESWL
Anesthesia: LOCAL | Laterality: Left

## 2015-07-09 MED ORDER — SODIUM CHLORIDE 0.9 % IJ SOLN: 3.0000 mL | INTRAMUSCULAR | Status: DC | PRN

## 2015-07-09 MED ORDER — SODIUM CHLORIDE 0.9 % IV SOLN: 250.0000 mL | INTRAVENOUS | Status: DC | PRN

## 2015-07-09 MED ORDER — OXYCODONE HCL 5 MG PO TABS
5.0000 mg | ORAL_TABLET | ORAL | Status: DC | PRN
  Administered 2015-07-09: 5 mg via ORAL
  Filled 2015-07-09: qty 1

## 2015-07-09 MED ORDER — SODIUM CHLORIDE 0.9 % IJ SOLN: 3.0000 mL | Freq: Two times a day (BID) | INTRAMUSCULAR | Status: DC

## 2015-07-09 MED ORDER — DIPHENHYDRAMINE HCL 25 MG PO CAPS
25.0000 mg | ORAL_CAPSULE | Freq: Once | ORAL | Status: AC
  Administered 2015-07-09: 25 mg via ORAL
  Filled 2015-07-09: qty 1

## 2015-07-09 MED ORDER — SODIUM CHLORIDE 0.9 % IV SOLN
INTRAVENOUS | Status: DC
  Administered 2015-07-09: 10:00:00 via INTRAVENOUS

## 2015-07-09 MED ORDER — OXYCODONE-ACETAMINOPHEN 5-325 MG PO TABS: 1.0000 | ORAL_TABLET | Freq: Four times a day (QID) | ORAL | Status: DC | PRN

## 2015-07-09 MED ORDER — DIAZEPAM 2 MG PO TABS
10.0000 mg | ORAL_TABLET | Freq: Once | ORAL | Status: AC
  Administered 2015-07-09: 10 mg via ORAL
  Filled 2015-07-09: qty 5

## 2015-07-09 MED ORDER — CIPROFLOXACIN HCL 500 MG PO TABS
500.0000 mg | ORAL_TABLET | Freq: Once | ORAL | Status: AC
  Administered 2015-07-09: 500 mg via ORAL
  Filled 2015-07-09: qty 1

## 2015-07-09 NOTE — Discharge Instructions (Signed)

## 2015-07-09 NOTE — Interval H&P Note (Signed)
History and Physical Interval Note:  No change in stone on KUB  07/09/2015 10:18 AM  Maria [name]  has presented today for surgery, with the diagnosis of LEFT RENAL STONE  The various methods of treatment have been discussed with the patient and family. After consideration of risks, benefits and other options for treatment, the patient has consented to  Procedure(s): LEFT EXTRACORPOREAL SHOCK WAVE LITHOTRIPSY (ESWL) (Left) as a surgical intervention .  The patient's history has been reviewed, patient examined, no change in status, stable for surgery.  I have reviewed the patient's chart and labs.  Questions were answered to the patient's satisfaction.     Maria [name] J

## 2015-10-10 ENCOUNTER — Emergency Department (HOSPITAL_COMMUNITY)
Admission: EM | Admit: 2015-10-10 | Discharge: 2015-10-10 | Disposition: A | Discharge status: Home / Self Care | Payer: BLUE CROSS/BLUE SHIELD | Attending: Emergency Medicine | Admitting: Emergency Medicine

## 2015-10-10 ENCOUNTER — Encounter (HOSPITAL_COMMUNITY): Payer: Self-pay | Admitting: Emergency Medicine

## 2015-10-10 ENCOUNTER — Emergency Department (HOSPITAL_COMMUNITY): Payer: BLUE CROSS/BLUE SHIELD

## 2015-10-10 DIAGNOSIS — Z8719 Personal history of other diseases of the digestive system: Secondary | ICD-10-CM | POA: Insufficient documentation

## 2015-10-10 DIAGNOSIS — Z86018 Personal history of other benign neoplasm: Secondary | ICD-10-CM | POA: Diagnosis not present

## 2015-10-10 DIAGNOSIS — Z79899 Other long term (current) drug therapy: Secondary | ICD-10-CM | POA: Diagnosis not present

## 2015-10-10 DIAGNOSIS — R079 Chest pain, unspecified: Secondary | ICD-10-CM

## 2015-10-10 DIAGNOSIS — J45909 Unspecified asthma, uncomplicated: Secondary | ICD-10-CM | POA: Diagnosis not present

## 2015-10-10 DIAGNOSIS — N189 Chronic kidney disease, unspecified: Secondary | ICD-10-CM | POA: Insufficient documentation

## 2015-10-10 DIAGNOSIS — F17210 Nicotine dependence, cigarettes, uncomplicated: Secondary | ICD-10-CM | POA: Insufficient documentation

## 2015-10-10 LAB — CBC WITH DIFFERENTIAL/PLATELET
Basophils Absolute: 0 K/uL (ref 0.0–0.1)
Basophils Relative: 0 %
Eosinophils Absolute: 0.1 K/uL (ref 0.0–0.7)
Eosinophils Relative: 2 %
HCT: 40.9 % (ref 36.0–46.0)
Hemoglobin: 13.6 g/dL (ref 12.0–15.0)
Lymphocytes Relative: 49 %
Lymphs Abs: 3.7 K/uL (ref 0.7–4.0)
MCH: 27.5 pg (ref 26.0–34.0)
MCHC: 33.3 g/dL (ref 30.0–36.0)
MCV: 82.8 fL (ref 78.0–100.0)
Monocytes Absolute: 0.4 K/uL (ref 0.1–1.0)
Monocytes Relative: 6 %
Neutro Abs: 3.2 K/uL (ref 1.7–7.7)
Neutrophils Relative %: 43 %
Platelets: 201 K/uL (ref 150–400)
RBC: 4.94 MIL/uL (ref 3.87–5.11)
RDW: 14.5 % (ref 11.5–15.5)
WBC: 7.4 K/uL (ref 4.0–10.5)

## 2015-10-10 LAB — LIPASE, BLOOD: Lipase: 34 U/L (ref 11–51)

## 2015-10-10 LAB — COMPREHENSIVE METABOLIC PANEL
ALT: 26 U/L (ref 14–54)
ANION GAP: 9 (ref 5–15)
AST: 24 U/L (ref 15–41)
Albumin: 3.6 g/dL (ref 3.5–5.0)
Alkaline Phosphatase: 63 U/L (ref 38–126)
BUN: 11 mg/dL (ref 6–20)
CHLORIDE: 103 mmol/L (ref 101–111)
CO2: 24 mmol/L (ref 22–32)
Calcium: 9.1 mg/dL (ref 8.9–10.3)
Creatinine, Ser: 1.07 mg/dL — ABNORMAL HIGH (ref 0.44–1.00)
Glucose, Bld: 162 mg/dL — ABNORMAL HIGH (ref 65–99)
POTASSIUM: 3.9 mmol/L (ref 3.5–5.1)
Sodium: 136 mmol/L (ref 135–145)
Total Bilirubin: 0.4 mg/dL (ref 0.3–1.2)
Total Protein: 6.3 g/dL — ABNORMAL LOW (ref 6.5–8.1)

## 2015-10-10 LAB — TROPONIN I

## 2015-10-10 MED ORDER — ALBUTEROL SULFATE HFA 108 (90 BASE) MCG/ACT IN AERS
1.0000 | INHALATION_SPRAY | Freq: Four times a day (QID) | RESPIRATORY_TRACT | Status: AC | PRN
Start: 2015-10-10 — End: ?

## 2015-10-10 MED ORDER — PREDNISONE 20 MG PO TABS: 40.0000 mg | ORAL_TABLET | Freq: Every day | ORAL | Status: DC

## 2015-10-10 MED ORDER — METHYLPREDNISOLONE SODIUM SUCC 125 MG IJ SOLR
125.0000 mg | Freq: Once | INTRAMUSCULAR | Status: AC
  Administered 2015-10-10: 125 mg via INTRAVENOUS
  Filled 2015-10-10: qty 2

## 2015-10-10 MED ORDER — SODIUM CHLORIDE 0.9 % IV SOLN
INTRAVENOUS | Status: DC
  Administered 2015-10-10: 1000 mL via INTRAVENOUS

## 2015-10-10 MED ORDER — IPRATROPIUM BROMIDE 0.02 % IN SOLN
0.5000 mg | Freq: Once | RESPIRATORY_TRACT | Status: AC
  Administered 2015-10-10: 0.5 mg via RESPIRATORY_TRACT
  Filled 2015-10-10: qty 2.5

## 2015-10-10 MED ORDER — OXYCODONE-ACETAMINOPHEN 5-325 MG PO TABS
1.0000 | ORAL_TABLET | Freq: Once | ORAL | Status: AC
  Administered 2015-10-10: 1 via ORAL
  Filled 2015-10-10: qty 1

## 2015-10-10 MED ORDER — ALBUTEROL (5 MG/ML) CONTINUOUS INHALATION SOLN
10.0000 mg/h | INHALATION_SOLUTION | Freq: Once | RESPIRATORY_TRACT | Status: AC
  Administered 2015-10-10: 10 mg/h via RESPIRATORY_TRACT
  Filled 2015-10-10: qty 20

## 2015-10-10 NOTE — ED Notes (Signed)
Pt comfortable with discharge and follow up instructions. Pt declines wheelchair, escorted to waiting area by this RN. Prescriptions x2. 

## 2015-10-10 NOTE — ED Notes (Signed)
Allen, MD at bedside

## 2015-10-10 NOTE — Discharge Instructions (Signed)
Asthma, Adult Asthma is a recurring condition in which the airways tighten and narrow. Asthma can make it difficult to breathe. It can cause coughing, wheezing, and shortness of breath. Asthma episodes, also called asthma attacks, range from minor to life-threatening. Asthma cannot be cured, but medicines and lifestyle changes can help control it. CAUSES Asthma is believed to be caused by inherited (genetic) and environmental factors, but its exact cause is unknown. Asthma may be triggered by allergens, lung infections, or irritants in the air. Asthma triggers are different for each person. Common triggers include:   Animal dander.  Dust mites.  Cockroaches.  Pollen from trees or grass.  Mold.  Smoke.  Air pollutants such as dust, household cleaners, hair sprays, aerosol sprays, paint fumes, strong chemicals, or strong odors.  Cold air, weather changes, and winds (which increase molds and pollens in the air).  Strong emotional expressions such as crying or laughing hard.  Stress.  Certain medicines (such as aspirin) or types of drugs (such as beta-blockers).  Sulfites in foods and drinks. Foods and drinks that may contain sulfites include dried fruit, potato chips, and sparkling grape juice.  Infections or inflammatory conditions such as the flu, a cold, or an inflammation of the nasal membranes (rhinitis).  Gastroesophageal reflux disease (GERD).  Exercise or strenuous activity. SYMPTOMS Symptoms may occur immediately after asthma is triggered or many hours later. Symptoms include:  Wheezing.  Excessive nighttime or early morning coughing.  Frequent or severe coughing with a common cold.  Chest tightness.  Shortness of breath. DIAGNOSIS  The diagnosis of asthma is made by a review of your medical history and a physical exam. Tests may also be performed. These may include:  Lung function studies. These tests show how much air you breathe in and out.  Allergy  tests.  Imaging tests such as X-rays. TREATMENT  Asthma cannot be cured, but it can usually be controlled. Treatment involves identifying and avoiding your asthma triggers. It also involves medicines. There are 2 classes of medicine used for asthma treatment:   Controller medicines. These prevent asthma symptoms from occurring. They are usually taken every day.  Reliever or rescue medicines. These quickly relieve asthma symptoms. They are used as needed and provide short-term relief. Your health care provider will help you create an asthma action plan. An asthma action plan is a written plan for managing and treating your asthma attacks. It includes a list of your asthma triggers and how they may be avoided. It also includes information on when medicines should be taken and when their dosage should be changed. An action plan may also involve the use of a device called a peak flow meter. A peak flow meter measures how well the lungs are working. It helps you monitor your condition. HOME CARE INSTRUCTIONS   Take medicines only as directed by your health care provider. Speak with your health care provider if you have questions about how or when to take the medicines.  Use a peak flow meter as directed by your health care provider. Record and keep track of readings.  Understand and use the action plan to help minimize or stop an asthma attack without needing to seek medical care.  Control your home environment in the following ways to help prevent asthma attacks:  Do not smoke. Avoid being exposed to secondhand smoke.  Change your heating and air conditioning filter regularly.  Limit your use of fireplaces and wood stoves.  Get rid of pests (such as roaches   and mice) and their droppings.  Throw away plants if you see mold on them.  Clean your floors and dust regularly. Use unscented cleaning products.  Try to have someone else vacuum for you regularly. Stay out of rooms while they are  being vacuumed and for a short while afterward. If you vacuum, use a dust mask from a hardware store, a double-layered or microfilter vacuum cleaner bag, or a vacuum cleaner with a HEPA filter.  Replace carpet with wood, tile, or vinyl flooring. Carpet can trap dander and dust.  Use allergy-proof pillows, mattress covers, and box spring covers.  Wash bed sheets and blankets every week in hot water and dry them in a dryer.  Use blankets that are made of polyester or cotton.  Clean bathrooms and kitchens with bleach. If possible, have someone repaint the walls in these rooms with mold-resistant paint. Keep out of the rooms that are being cleaned and painted.  Wash hands frequently. SEEK MEDICAL CARE IF:   You have wheezing, shortness of breath, or a cough even if taking medicine to prevent attacks.  The colored mucus you cough up (sputum) is thicker than usual.  Your sputum changes from clear or white to yellow, green, gray, or bloody.  You have any problems that may be related to the medicines you are taking (such as a rash, itching, swelling, or trouble breathing).  You are using a reliever medicine more than 2-3 times per week.  Your peak flow is still at 50-79% of your personal best after following your action plan for 1 hour.  You have a fever. SEEK IMMEDIATE MEDICAL CARE IF:   You seem to be getting worse and are unresponsive to treatment during an asthma attack.  You are short of breath even at rest.  You get short of breath when doing very little physical activity.  You have difficulty eating, drinking, or talking due to asthma symptoms.  You develop chest pain.  You develop a fast heartbeat.  You have a bluish color to your lips or fingernails.  You are light-headed, dizzy, or faint.  Your peak flow is less than 50% of your personal best.   This information is not intended to replace advice given to you by your health care provider. Make sure you discuss any  questions you have with your health care provider.   Document Released: 10/17/2005 Document Revised: 07/08/2015 Document Reviewed: 05/16/2013 Elsevier Interactive Patient Education 2016 [employer]  

## 2015-10-10 NOTE — ED Notes (Signed)
Pt states that she started having pain in her right chest a few days before Thanksgiving. Pt states that the pain is constant, and is unrelieved by over the counter treatments. Pt states that she quit smoking to try to stop the chest pain, but states that it hasn't helped.

## 2015-10-10 NOTE — ED Provider Notes (Signed)
CSN: [id number]     Arrival date & time 10/10/15  [id number] History   First MD Initiated Contact with Patient 10/10/15 0703     Chief Complaint  Patient presents with  . Chest Pain     (Consider location/radiation/quality/duration/timing/severity/associated sxs/prior Treatment) Patient is a 34 y.o. female presenting with chest pain. The history is provided by the patient.  Chest Pain Pain location:  Substernal area Pain quality: tightness   Pain radiates to:  Does not radiate Pain radiates to the back: no   Pain severity:  Mild Onset quality:  Gradual Duration:  2 weeks Timing:  Constant Progression:  Worsening Chronicity:  New Context: not eating, not lifting and no stress   Relieved by:  Nothing Worsened by:  Nothing tried Ineffective treatments:  Antacids Associated symptoms: cough   Associated symptoms: no diaphoresis and no fever     Past Medical History  Diagnosis Date  . Prediabetes   . Acid reflux   . Fibroids   . Chronic kidney disease     kidney stones    Past Surgical History  Procedure Laterality Date  . Cesarean section    . Abdominal hysterectomy N/A 05/27/2015    Procedure: HYSTERECTOMY ABDOMINAL;  Surgeon: Janyth Pupa, DO;  Location: [facility];  Service: Gynecology;  Laterality: N/A;  669.1g   . Bilateral salpingectomy Bilateral 05/27/2015    Procedure: BILATERAL SALPINGECTOMY;  Surgeon: Janyth Pupa, DO;  Location: [facility];  Service: Gynecology;  Laterality: Bilateral;   History reviewed. No pertinent family history. Social History  Substance Use Topics  . Smoking status: Current Some Day Smoker -- 0.25 packs/day    Types: Cigarettes  . Smokeless tobacco: Never Used  . Alcohol Use: Yes     Comment: social    OB History    No data available     Review of Systems  Constitutional: Negative for fever and diaphoresis.  Respiratory: Positive for cough.   Cardiovascular: Positive for chest pain.  All other systems reviewed and are  negative.     Allergies  Review of patient's allergies indicates no known allergies.  Home Medications   Prior to Admission medications   Medication Sig Start Date End Date Taking? Authorizing Provider  docusate sodium (COLACE) 100 MG capsule Take 1 capsule (100 mg total) by mouth 2 (two) times daily. 05/29/15   Janyth Pupa, DO  oxyCODONE-acetaminophen (PERCOCET/ROXICET) 5-325 MG per tablet Take 1-2 tablets by mouth every 6 (six) hours as needed (moderate to severe pain (when tolerating fluids)). 07/09/15   Irine Seal, MD  traMADol (ULTRAM) 50 MG tablet Take 50 mg by mouth every 6 (six) hours as needed for moderate pain.    Historical Provider, MD   LMP 05/12/2015 (Exact Date) Physical Exam  Constitutional: She is oriented to person, place, and time. She appears well-developed and well-nourished.  Non-toxic appearance. No distress.  HENT:  Head: Normocephalic and atraumatic.  Eyes: Conjunctivae, EOM and lids are normal. Pupils are equal, round, and reactive to light.  Neck: Normal range of motion. Neck supple. No tracheal deviation present. No thyroid mass present.  Cardiovascular: Normal rate, regular rhythm and normal heart sounds.  Exam reveals no gallop.   No murmur heard. Pulmonary/Chest: Effort normal and breath sounds normal. No stridor. No respiratory distress. She has no decreased breath sounds. She has no wheezes. She has no rhonchi. She has no rales.  Abdominal: Soft. Normal appearance and bowel sounds are normal. She exhibits no distension. There is no tenderness. There is  no rebound and no CVA tenderness.  Musculoskeletal: Normal range of motion. She exhibits no edema or tenderness.  Neurological: She is alert and oriented to person, place, and time. She has normal strength. No cranial nerve deficit or sensory deficit. GCS eye subscore is 4. GCS verbal subscore is 5. GCS motor subscore is 6.  Skin: Skin is warm and dry. No abrasion and no rash noted.  Psychiatric: She has a  normal mood and affect. Her speech is normal and behavior is normal.  Nursing note and vitals reviewed.   ED Course  Procedures (including critical care time) Labs Review Labs Reviewed  CBC WITH DIFFERENTIAL/PLATELET  COMPREHENSIVE METABOLIC PANEL  LIPASE, BLOOD  TROPONIN I    Imaging Review No results found. I have personally reviewed and evaluated these images and lab results as part of my medical decision-making.   EKG Interpretation   Date/Time:  Saturday October 10 2015 06:59:23 EST Ventricular Rate:  65 PR Interval:  134 QRS Duration: 84 QT Interval:  389 QTC Calculation: 404 R Axis:   54 Text Interpretation:  Sinus rhythm Low voltage, precordial leads ST  elevation, consider inferior injury Baseline wander in lead(s) II III aVR  aVL aVF V1 Confirmed by Paisely Brick  MD, Alyas Creary ([id number]) on 10/10/2015 7:15:51  AM      MDM   Final diagnoses:  Chest pain    Patient given albuterol treatment over one hour and also Solu-Medrol and feels better Suspect the bronchospasm as the cause of her symptoms. We'll be treated with short course of prednisone and follow with her Dr. as needed. She was encouraged to continue abstaining from tobacco products    Lacretia Leigh, MD 10/10/15 [phone]

## 2016-05-04 DIAGNOSIS — N83299 Other ovarian cyst, unspecified side: Secondary | ICD-10-CM | POA: Diagnosis not present

## 2016-05-10 ENCOUNTER — Other Ambulatory Visit: Payer: Self-pay | Admitting: Obstetrics & Gynecology

## 2016-05-10 DIAGNOSIS — N83299 Other ovarian cyst, unspecified side: Secondary | ICD-10-CM

## 2016-05-16 ENCOUNTER — Ambulatory Visit
Admission: RE | Admit: 2016-05-16 | Discharge: 2016-05-16 | Disposition: A | Discharge status: Home / Self Care | Payer: BLUE CROSS/BLUE SHIELD | Source: Ambulatory Visit | Attending: Obstetrics & Gynecology | Admitting: Obstetrics & Gynecology

## 2016-05-16 DIAGNOSIS — N83291 Other ovarian cyst, right side: Secondary | ICD-10-CM | POA: Diagnosis not present

## 2016-05-16 DIAGNOSIS — N83299 Other ovarian cyst, unspecified side: Secondary | ICD-10-CM

## 2016-05-16 DIAGNOSIS — N83292 Other ovarian cyst, left side: Secondary | ICD-10-CM | POA: Diagnosis not present

## 2016-05-16 MED ORDER — GADOBENATE DIMEGLUMINE 529 MG/ML IV SOLN
15.0000 mL | Freq: Once | INTRAVENOUS | Status: AC | PRN
  Administered 2016-05-16: 15 mL via INTRAVENOUS

## 2016-05-18 ENCOUNTER — Encounter (HOSPITAL_COMMUNITY): Payer: Self-pay

## 2016-05-18 DIAGNOSIS — R102 Pelvic and perineal pain: Secondary | ICD-10-CM | POA: Diagnosis not present

## 2016-05-18 NOTE — Progress Notes (Signed)
FMLA dated 04/19/16 completed on behalf of individual as primary caregiver of her mother who is a current active patient in [facility] under our services [facility] [place]). Forms faxed to provided # Attn: Donny Pique at [phone] Copy of forms scanned into electronic medical record.  Renee Pain

## 2016-05-19 DIAGNOSIS — R351 Nocturia: Secondary | ICD-10-CM | POA: Diagnosis not present

## 2016-05-19 DIAGNOSIS — R102 Pelvic and perineal pain: Secondary | ICD-10-CM | POA: Diagnosis not present

## 2016-06-10 DIAGNOSIS — R0683 Snoring: Secondary | ICD-10-CM | POA: Diagnosis not present

## 2016-07-07 DIAGNOSIS — N3281 Overactive bladder: Secondary | ICD-10-CM | POA: Diagnosis not present

## 2016-07-07 DIAGNOSIS — R102 Pelvic and perineal pain: Secondary | ICD-10-CM | POA: Diagnosis not present

## 2016-07-07 DIAGNOSIS — R351 Nocturia: Secondary | ICD-10-CM | POA: Diagnosis not present

## 2016-07-29 DIAGNOSIS — M62838 Other muscle spasm: Secondary | ICD-10-CM | POA: Diagnosis not present

## 2016-08-05 DIAGNOSIS — M62838 Other muscle spasm: Secondary | ICD-10-CM | POA: Diagnosis not present

## 2016-08-12 DIAGNOSIS — M62838 Other muscle spasm: Secondary | ICD-10-CM | POA: Diagnosis not present

## 2016-08-25 DIAGNOSIS — R102 Pelvic and perineal pain: Secondary | ICD-10-CM | POA: Diagnosis not present

## 2016-08-25 DIAGNOSIS — N3281 Overactive bladder: Secondary | ICD-10-CM | POA: Diagnosis not present

## 2016-08-25 DIAGNOSIS — R351 Nocturia: Secondary | ICD-10-CM | POA: Diagnosis not present

## 2016-08-26 IMAGING — US US PELVIS COMPLETE
1 series · 13 of 25 positions shown · non-contrast
Comparison: 05/25/2014

CLINICAL DATA: Suprapubic abdominal pain.

EXAM:
TRANSABDOMINAL AND TRANSVAGINAL ULTRASOUND OF PELVIS
TECHNIQUE: Both transabdominal and transvaginal ultrasound examinations of the
pelvis were performed. Transabdominal technique was performed for
global imaging of the pelvis including uterus, ovaries, adnexal
regions, and pelvic cul-de-sac. It was necessary to proceed with
endovaginal exam following the transabdominal exam to visualize the
endometrium a.

[Series 1: us pelvis complete · 0.20mm/px · 13 of 29 slices shown]
[im 1/29]
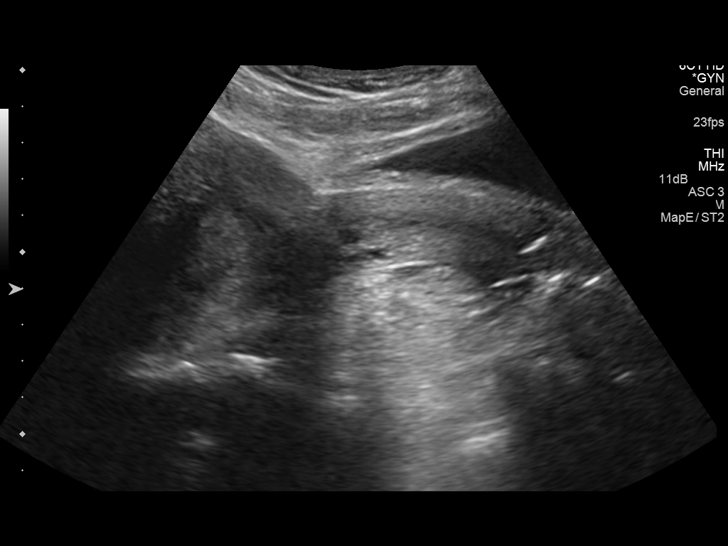
[im 3/29]
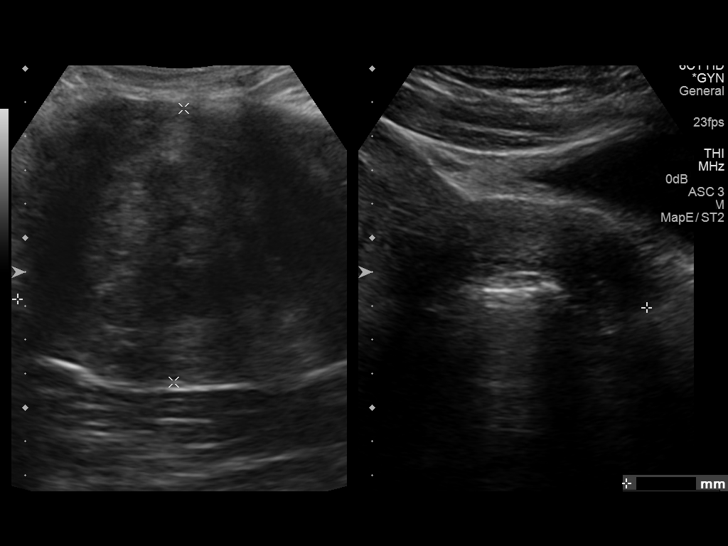
[im 5/29]
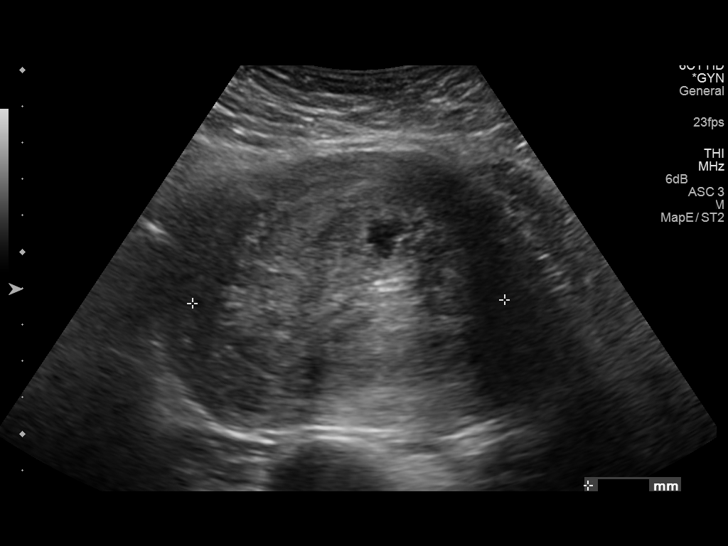
[im 8/29]
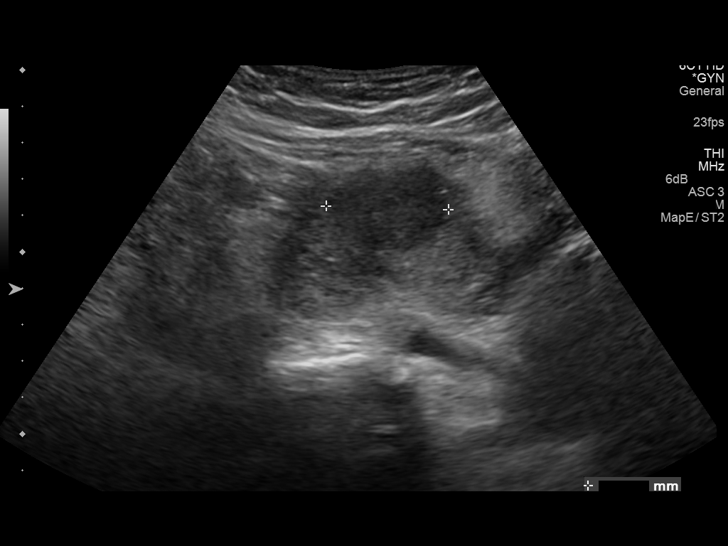
[im 10/29]
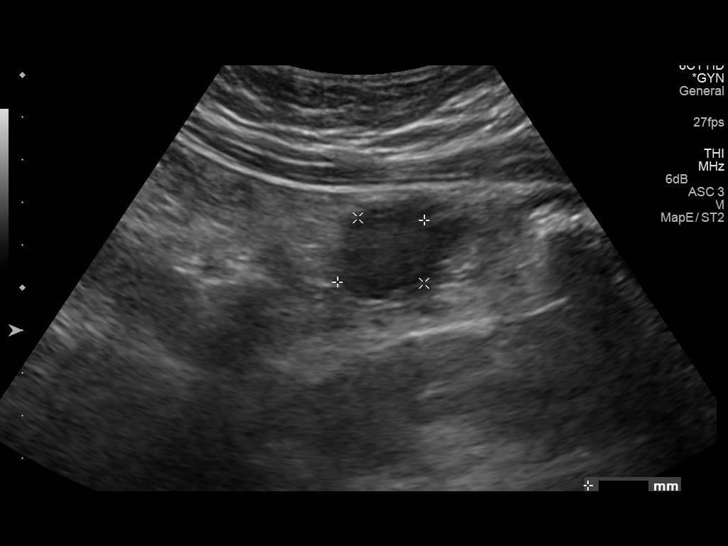
[im 12/29]
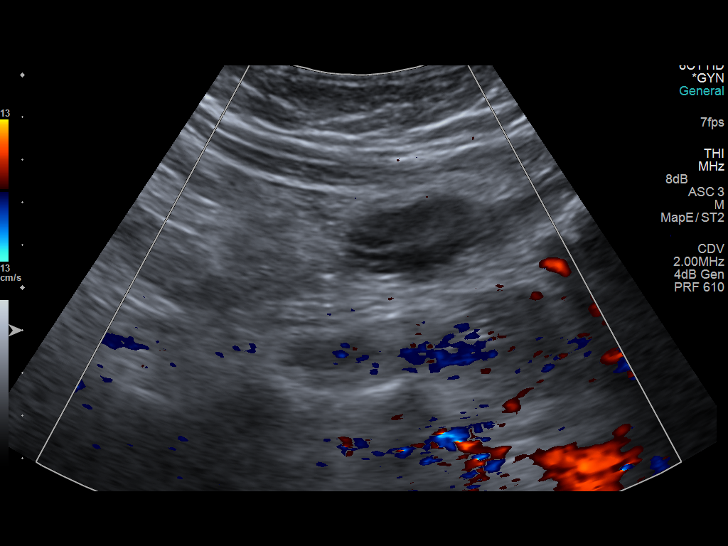
[im 15/29]
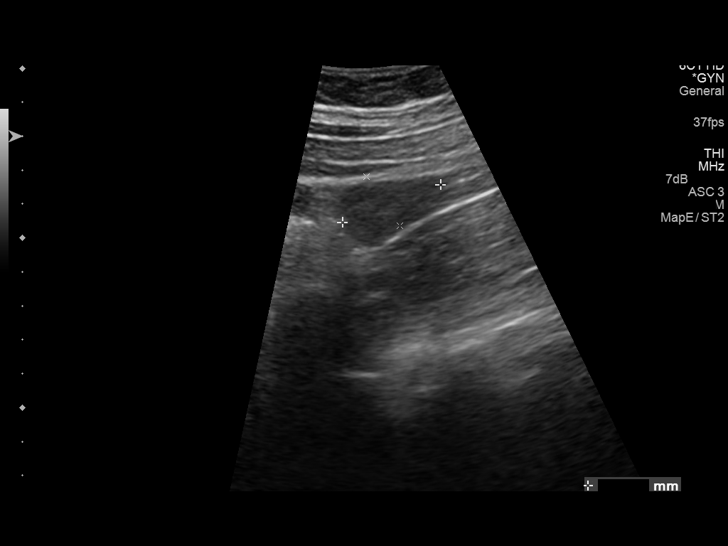
[im 17/29]
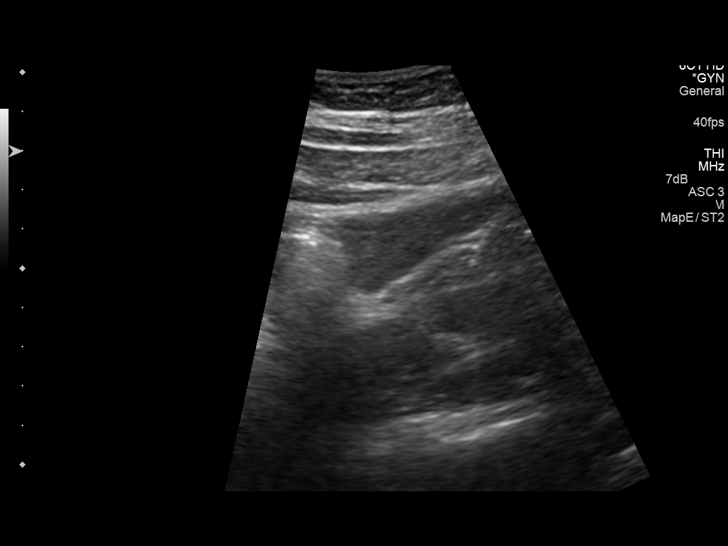
[im 19/29]
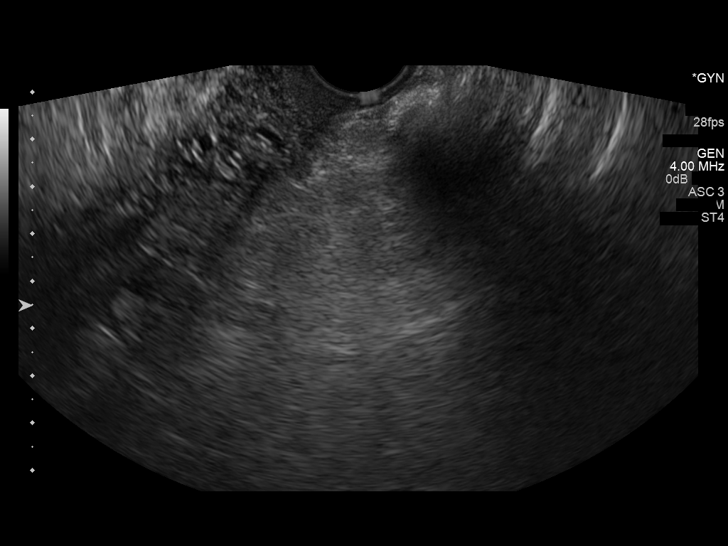
[im 22/29]
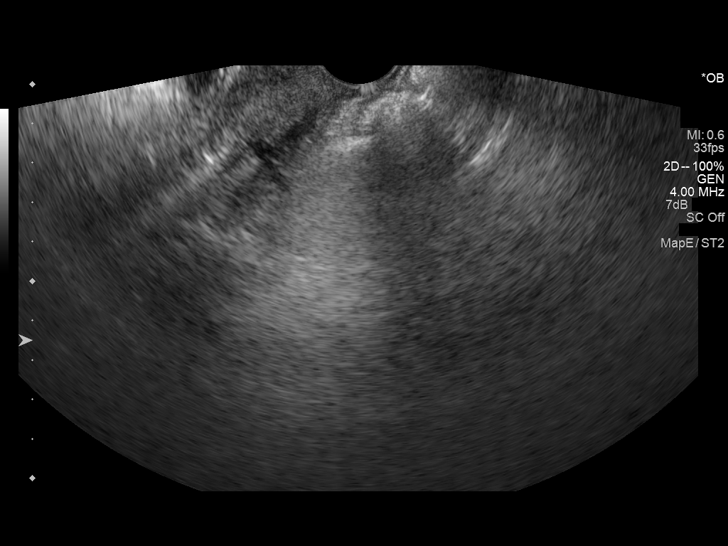
[im 24/29]
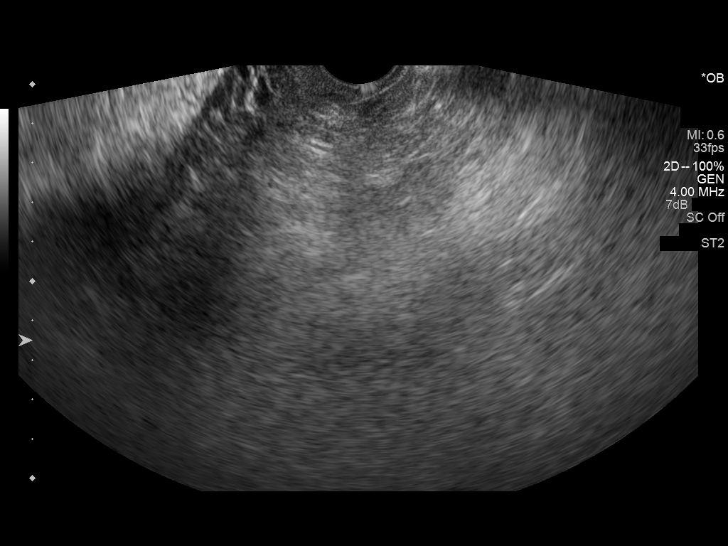
[im 26/29]
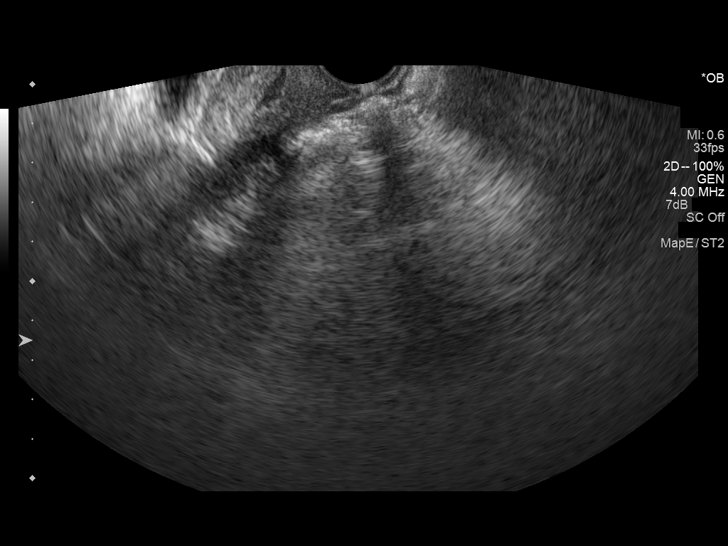
[im 29/29]
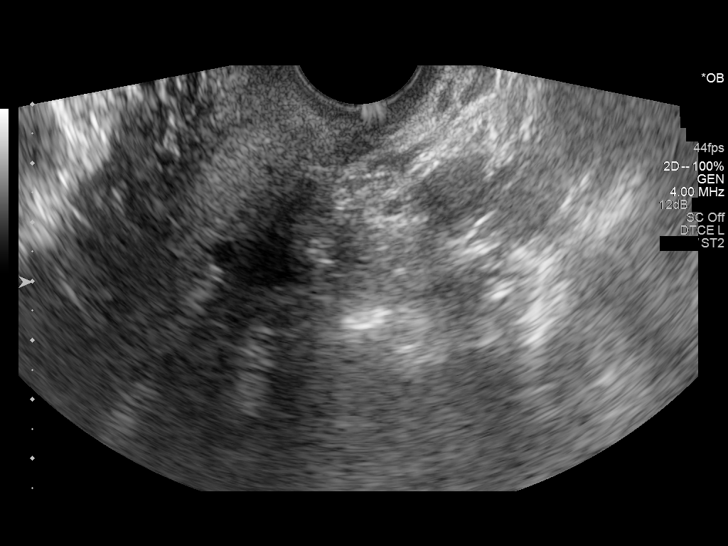

[13 of 25 positions shown; findings below may reference images not displayed]

FINDINGS: Uterus

Measurements: 18.5 by 8.0 by 11.1 cm. Several uterine masses include
a 9.3 by 8.0 by 0.6 cm posterior uterine body intramural fibroid and
a 4.0 by 2.9 by 3.4 cm anterior fundal intramural fibroid. The
larger fibroid appears to have some internal degeneration or cystic
necrosis centrally.

Endometrium

Not visualized, obscured by the fibroids.

Right ovary

Measurements: 3.1 by 1.8 by 2.5 cm. Normal appearance/no adnexal
mass.

Left ovary

Measurements: 2.5 by 2.2 by 2.1 cm. Normal appearance/no adnexal
mass.

Other findings

No free fluid.
IMPRESSION: 1. A posterior intramural uterine fibroid has increased in size
compared to the CT scan from 8878, currently measuring up to 9.3 cm
in long axis. This fibroid obscures the endometrium, and accordingly
comment on endometrial thickness cannot be made. Given the patient's
age, uterine sarcoma as an explanation for the significant
enlargement of the fibroid is considered statistically unlikely.
2. An anterior intramural fibroid is thought to be present for
example on image 9, and is increased in size compared to the prior
CT scan where it was not readily apparent.

## 2017-03-11 DIAGNOSIS — Z0000 Encounter for general adult medical examination without abnormal findings: Secondary | ICD-10-CM | POA: Diagnosis not present

## 2017-03-11 DIAGNOSIS — Z13228 Encounter for screening for other metabolic disorders: Secondary | ICD-10-CM | POA: Diagnosis not present

## 2017-06-07 ENCOUNTER — Emergency Department (HOSPITAL_COMMUNITY)
Admission: EM | Admit: 2017-06-07 | Discharge: 2017-06-07 | Disposition: A | Discharge status: Home / Self Care | Payer: BLUE CROSS/BLUE SHIELD | Attending: Emergency Medicine | Admitting: Emergency Medicine

## 2017-06-07 ENCOUNTER — Emergency Department (HOSPITAL_COMMUNITY): Payer: BLUE CROSS/BLUE SHIELD

## 2017-06-07 ENCOUNTER — Encounter (HOSPITAL_COMMUNITY): Payer: Self-pay | Admitting: *Deleted

## 2017-06-07 DIAGNOSIS — M791 Myalgia, unspecified site: Secondary | ICD-10-CM

## 2017-06-07 DIAGNOSIS — F17210 Nicotine dependence, cigarettes, uncomplicated: Secondary | ICD-10-CM | POA: Diagnosis not present

## 2017-06-07 DIAGNOSIS — M79601 Pain in right arm: Secondary | ICD-10-CM | POA: Diagnosis not present

## 2017-06-07 DIAGNOSIS — M25511 Pain in right shoulder: Secondary | ICD-10-CM | POA: Insufficient documentation

## 2017-06-07 DIAGNOSIS — M542 Cervicalgia: Secondary | ICD-10-CM | POA: Insufficient documentation

## 2017-06-07 DIAGNOSIS — R51 Headache: Secondary | ICD-10-CM | POA: Insufficient documentation

## 2017-06-07 DIAGNOSIS — R079 Chest pain, unspecified: Secondary | ICD-10-CM | POA: Diagnosis not present

## 2017-06-07 LAB — BASIC METABOLIC PANEL
ANION GAP: 7 (ref 5–15)
BUN: 13 mg/dL (ref 6–20)
CO2: 23 mmol/L (ref 22–32)
Calcium: 8.8 mg/dL — ABNORMAL LOW (ref 8.9–10.3)
Chloride: 106 mmol/L (ref 101–111)
Creatinine, Ser: 1.06 mg/dL — ABNORMAL HIGH (ref 0.44–1.00)
GFR calc non Af Amer: >60 mL/min (ref >60)
Glucose, Bld: 138 mg/dL — ABNORMAL HIGH (ref 65–99)
Potassium: 3.8 mmol/L (ref 3.5–5.1)
Sodium: 136 mmol/L (ref 135–145)

## 2017-06-07 LAB — CBC
HCT: 39.4 % (ref 36.0–46.0)
HEMOGLOBIN: 13.8 g/dL (ref 12.0–15.0)
MCH: 28.8 pg (ref 26.0–34.0)
MCHC: 35 g/dL (ref 30.0–36.0)
MCV: 82.1 fL (ref 78.0–100.0)
Platelets: 196 10*3/uL (ref 150–400)
RBC: 4.8 MIL/uL (ref 3.87–5.11)
RDW: 13.9 % (ref 11.5–15.5)
WBC: 8 10*3/uL (ref 4.0–10.5)

## 2017-06-07 LAB — I-STAT TROPONIN, ED: Troponin i, poc: 0.03 ng/mL (ref 0.00–0.08)

## 2017-06-07 MED ORDER — METHOCARBAMOL 500 MG PO TABS
1000.0000 mg | ORAL_TABLET | Freq: Once | ORAL | Status: AC
  Administered 2017-06-07: 1000 mg via ORAL
  Filled 2017-06-07: qty 2

## 2017-06-07 MED ORDER — METHOCARBAMOL 500 MG PO TABS: 500.0000 mg | ORAL_TABLET | Freq: Three times a day (TID) | ORAL | 0 refills | Status: DC | PRN

## 2017-06-07 NOTE — ED Triage Notes (Signed)
Pt c/o R sided chest pain onset last night. Pain unrelieved with advil or inhaler

## 2017-06-07 NOTE — ED Provider Notes (Signed)
[place] DEPT Provider Note   CSN: [id number] Arrival date & time: 06/07/17  0410     History   Chief Complaint Chief Complaint  Patient presents with  . Chest Pain    HPI Maria [name] is a 36 y.o. female.  HPI: 36 year old female. Reports 3 days of right neck, shoulder, chest and arm pain. States that she works at [employer] place and does some repetitive motion. Also states that she has been at the gym for the last few weeks. At times she would feel that her arm was tingling or painful. Does not have any disuse of her arm with eating tech sting etc. No pain with movement of the neck but has some discomfort and tightness to the right side of her neck. Doesn't have pleuritic discomfort. No cough or shortness of breath. No exertional symptoms or anginal pain. She is currently taping trying to quit smoking. Her mother had an MI and CHF. She has no additional risk for heart disease. No history of hypertension diabetes. Does not use hormones. No recent pregnancies. No casts, splints, fractures, surgeries, or other risks for PE.  Past Medical History:  Diagnosis Date  . Acid reflux   . Chronic kidney disease    kidney stones   . Fibroids   . Prediabetes     Patient Active Problem List   Diagnosis Date Noted  . Uterine leiomyoma 05/27/2015    Past Surgical History:  Procedure Laterality Date  . ABDOMINAL HYSTERECTOMY N/A 05/27/2015   Procedure: HYSTERECTOMY ABDOMINAL;  Surgeon: Janyth Pupa, DO;  Location: [facility];  Service: Gynecology;  Laterality: N/A;  669.1g   . BILATERAL SALPINGECTOMY Bilateral 05/27/2015   Procedure: BILATERAL SALPINGECTOMY;  Surgeon: Janyth Pupa, DO;  Location: [facility];  Service: Gynecology;  Laterality: Bilateral;  . CESAREAN SECTION      OB History    No data available       Home Medications    Prior to Admission medications   Medication Sig Start Date End Date Taking? Authorizing Provider  albuterol (PROVENTIL HFA;VENTOLIN HFA) 108  (90 BASE) MCG/ACT inhaler Inhale 1-2 puffs into the lungs every 6 (six) hours as needed for wheezing or shortness of breath. 10/10/15   Lacretia Leigh, MD  ibuprofen (ADVIL,MOTRIN) 200 MG tablet Take 200 mg by mouth every 6 (six) hours as needed for moderate pain.    [provider]  methocarbamol (ROBAXIN) 500 MG tablet Take 1 tablet (500 mg total) by mouth 3 (three) times daily between meals as needed. 06/07/17   Tanna Furry, MD  predniSONE (DELTASONE) 20 MG tablet Take 2 tablets (40 mg total) by mouth daily. 10/10/15   Lacretia Leigh, MD    Family History No family history on file.  Social History Social History  Substance Use Topics  . Smoking status: Current Some Day Smoker    Packs/day: 0.25    Types: Cigarettes  . Smokeless tobacco: Never Used  . Alcohol use Yes     Comment: social      Allergies   Patient has no known allergies.   Review of Systems Review of Systems  Constitutional: Negative for appetite change, chills, diaphoresis, fatigue and fever.  HENT: Negative for mouth sores, sore throat and trouble swallowing.   Eyes: Negative for visual disturbance.  Respiratory: Negative for cough, chest tightness, shortness of breath and wheezing.   Cardiovascular: Negative for chest pain.  Gastrointestinal: Negative for abdominal distention, abdominal pain, diarrhea, nausea and vomiting.  Endocrine: Negative for polydipsia,  polyphagia and polyuria.  Genitourinary: Negative for dysuria, frequency and hematuria.  Musculoskeletal: Positive for arthralgias and neck pain. Negative for gait problem.  Skin: Negative for color change, pallor and rash.  Neurological: Positive for headaches. Negative for dizziness, syncope and light-headedness.  Hematological: Does not bruise/bleed easily.  Psychiatric/Behavioral: Negative for behavioral problems and confusion.     Physical Exam Updated Vital Signs BP 106/80   Pulse 67   Temp 98 F (36.7 C) (Oral)   Resp (!) 9   LMP  05/12/2015 (Exact Date)   SpO2 100%   Physical Exam  Constitutional: She is oriented to person, place, and time. She appears well-developed and well-nourished. No distress.  HENT:  Head: Normocephalic.  Eyes: Pupils are equal, round, and reactive to light. Conjunctivae are normal. No scleral icterus.  Neck: Normal range of motion. Neck supple. No thyromegaly present.   Reproducible tenderness to right lateral neck.  Negative Spurling'  Normal range of motion, strength, and sensation to RUE.   Cardiovascular: Normal rate and regular rhythm.  Exam reveals no gallop and no friction rub.   No murmur heard. Pulmonary/Chest: Effort normal and breath sounds normal. No respiratory distress. She has no wheezes. She has no rales.  Abdominal: Soft. Bowel sounds are normal. She exhibits no distension. There is no tenderness. There is no rebound.  Musculoskeletal: Normal range of motion.  Neurological: She is alert and oriented to person, place, and time.  Skin: Skin is warm and dry. No rash noted.  Psychiatric: She has a normal mood and affect. Her behavior is normal.     ED Treatments / Results  Labs (all labs ordered are listed, but only abnormal results are displayed) Labs Reviewed  BASIC METABOLIC PANEL - Abnormal; Notable for the following:       Result Value   Glucose, Bld 138 (*)    Creatinine, Ser 1.06 (*)    Calcium 8.8 (*)    All other components within normal limits  CBC  I-STAT TROPONIN, ED    EKG  EKG Interpretation  Date/Time:  Wednesday June 07 2017 04:13:43 EDT Ventricular Rate:  68 PR Interval:  132 QRS Duration: 76 QT Interval:  390 QTC Calculation: 414 R Axis:   43 Text Interpretation:  Normal sinus rhythm Normal ECG No significant change since last tracing Confirmed by Alfonzo Beers [phone]) on 06/07/2017 8:40:35 AM       Radiology Dg Chest 2 View  Result Date: 06/07/2017 CLINICAL DATA:  Acute onset of right-sided chest pain and shortness of breath. Initial  encounter. EXAM: CHEST  2 VIEW COMPARISON:  Chest radiograph performed 10/10/2015 FINDINGS: The lungs are well-aerated and clear. There is no evidence of focal opacification, pleural effusion or pneumothorax. The heart is normal in size; the mediastinal contour is within normal limits. No acute osseous abnormalities are seen. IMPRESSION: No acute cardiopulmonary process seen. Electronically Signed   By: Garald Balding M.D.   On: 06/07/2017 04:57    Procedures Procedures (including critical care time)  Medications Ordered in ED Medications  methocarbamol (ROBAXIN) tablet 1,000 mg (not administered)     Initial Impression / Assessment and Plan / ED Course  I have reviewed the triage vital signs and the nursing notes.  Pertinent labs & imaging results that were available during my care of the patient were reviewed by me and considered in my medical decision making (see chart for details).    EKG, chest x-ray, labs reassuring. No symptoms or findings to suggest radiculopathy. Pain  is reproducible and seems musculoskeletal. After some discussion patient was partially accepting of this as an etiology. Plan muscle relaxant and anti-inflammatory. She requests cardiology follow-up because of her family history.  Final Clinical Impressions(s) / ED Diagnoses   Final diagnoses:  Neck pain  Chest pain, unspecified type  Muscle pain    New Prescriptions New Prescriptions   METHOCARBAMOL (ROBAXIN) 500 MG TABLET    Take 1 tablet (500 mg total) by mouth 3 (three) times daily between meals as needed.     Tanna Furry, MD 06/07/17 1030

## 2017-06-07 NOTE — Discharge Instructions (Signed)
Take over-the-counter anti-inflammatory such as Aleve as needed.  Robaxin, muscle relaxant, will help some of your symptoms as well.  Avoid repetitive motion, heavy lifting.  With your family history of heart disease you should at some point have cardiac evaluation. He has been given the number for [facility] cardiology.

## 2017-06-12 DIAGNOSIS — F419 Anxiety disorder, unspecified: Secondary | ICD-10-CM | POA: Diagnosis not present

## 2017-06-12 DIAGNOSIS — R0789 Other chest pain: Secondary | ICD-10-CM | POA: Diagnosis not present

## 2017-06-21 DIAGNOSIS — R0789 Other chest pain: Secondary | ICD-10-CM | POA: Diagnosis not present

## 2017-06-21 DIAGNOSIS — R509 Fever, unspecified: Secondary | ICD-10-CM | POA: Diagnosis not present

## 2017-06-21 DIAGNOSIS — N390 Urinary tract infection, site not specified: Secondary | ICD-10-CM | POA: Diagnosis not present

## 2017-06-28 IMAGING — MR MR PELVIS WO/W CM
7 of 8 series · 34 of 48 positions shown · IV contrast (Multihance 15ml)
Comparison: CT on 04/21/2015

CLINICAL DATA: Complex ovarian cyst. Previous hysterectomy for
fibroids.

EXAM:
MRI PELVIS WITHOUT AND WITH CONTRAST
TECHNIQUE: Multiplanar multisequence MR imaging of the pelvis was performed
both before and after administration of intravenous contrast.
CONTRAST:  15mL MULTIHANCE GADOBENATE DIMEGLUMINE 529 MG/ML IV SOLN

[Series 3: T2 · sagittal · 5.0mm · 0.78mm/px · 3 of 25 slices shown (1 of 2)]
[im 1/25]
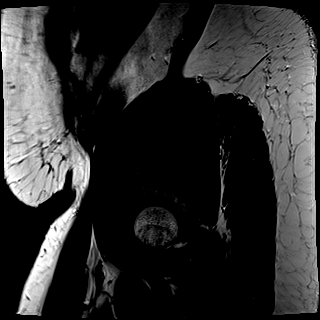
[im 13/25]
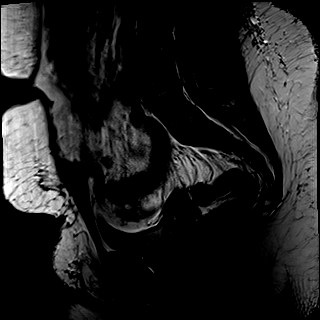
[im 25/25]
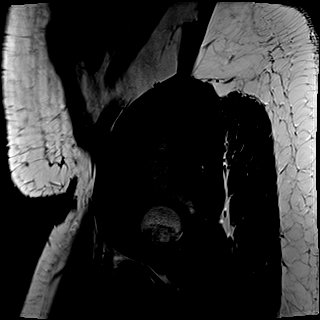

[Series 4: T2 · axial · 5.0mm · 0.41mm/px · z∈[-75,+99]mm · 3 of 30 slices shown (2 of 2)]
[im 1/30]
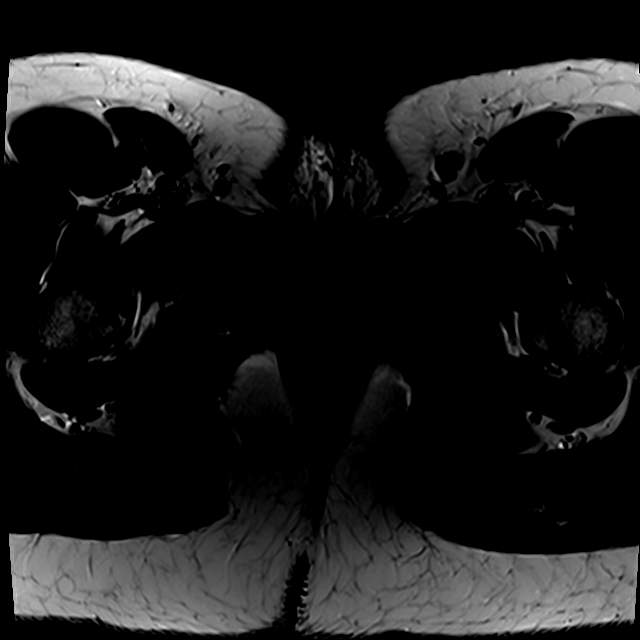
[im 15/30]
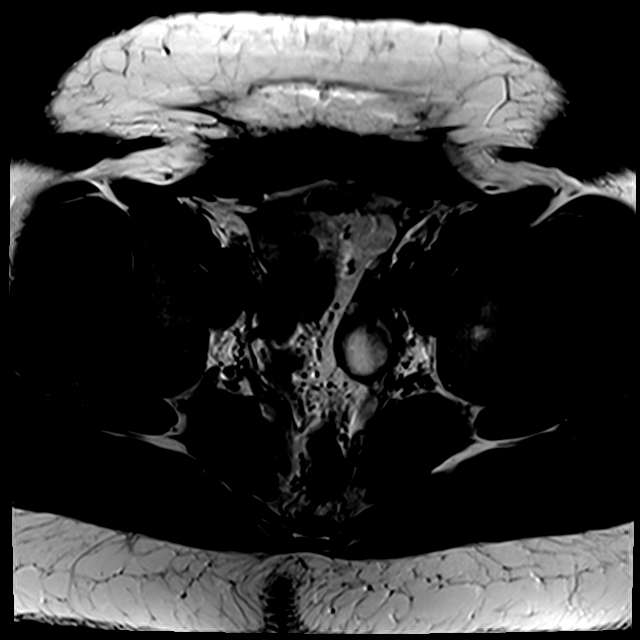
[im 30/30]
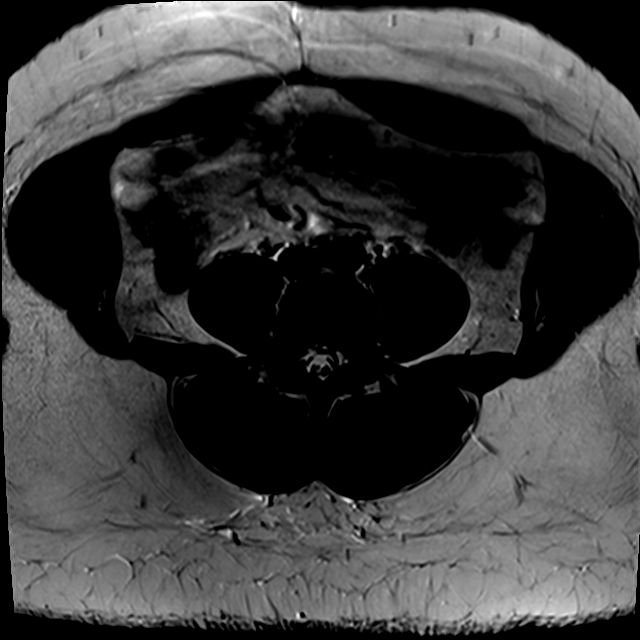

[Series 7: T1 · axial · 2.0mm · 0.75mm/px · z∈[-110,+128]mm · 9 of 120 slices shown]
[im 1/120]
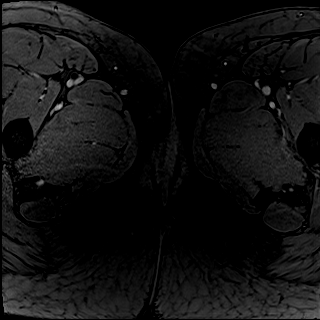
[im 15/120]
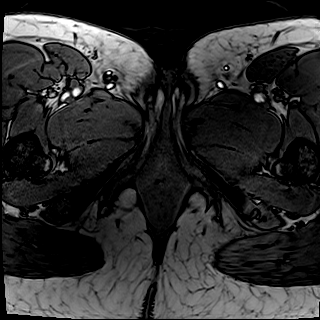
[im 30/120]
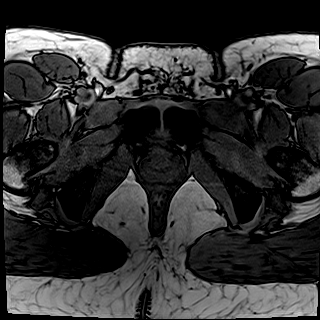
[im 45/120]
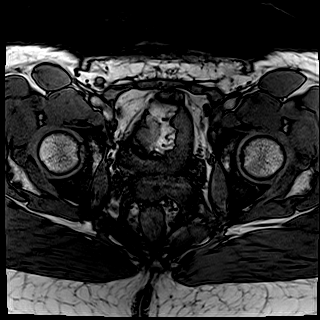
[im 60/120]
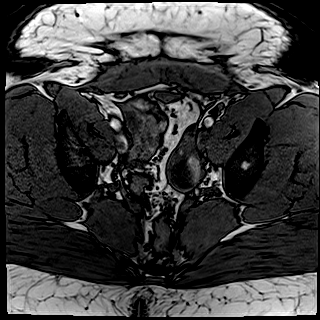
[im 75/120]
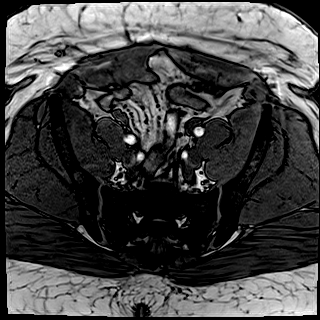
[im 90/120]
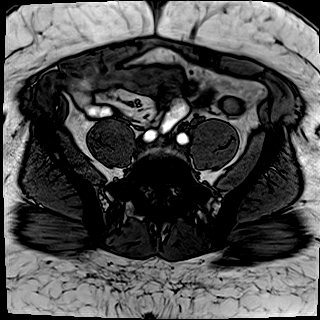
[im 105/120]
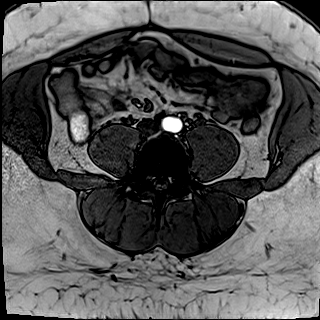
[im 120/120]
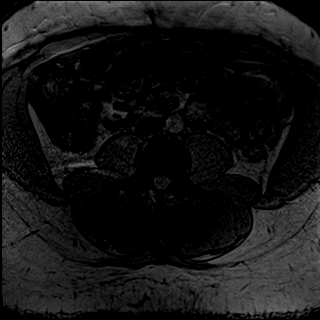

[Series 8: T2 fat-sat · axial · 5.0mm · 0.41mm/px · z∈[-75,+99]mm · 2 of 30 slices shown]
[im 1/30]
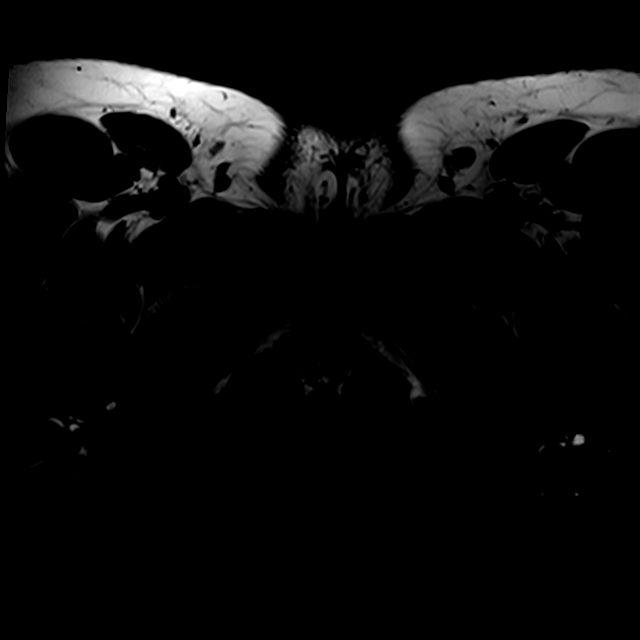
[im 30/30]
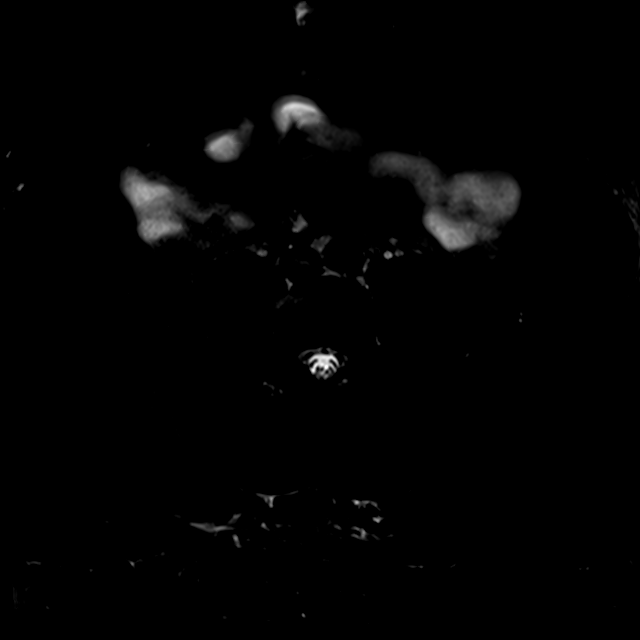

[Series 10: T1 fat-sat · axial · 2.0mm · 0.75mm/px · z∈[-110,+128]mm · 8 of 120 slices shown (1 of 2)]
[im 1/120]
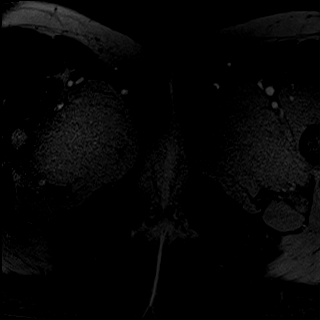
[im 15/120]
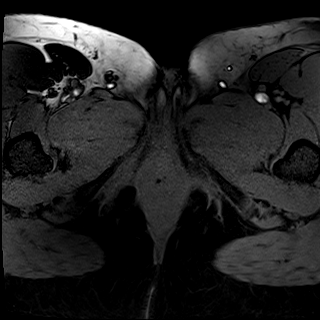
[im 30/120]
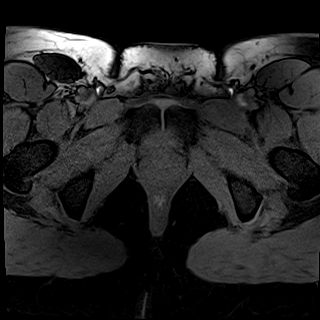
[im 45/120]
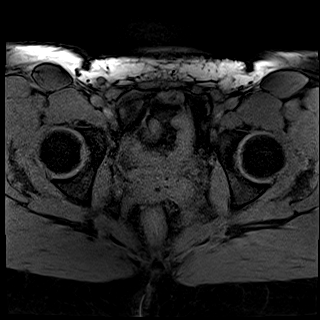
[im 75/120]
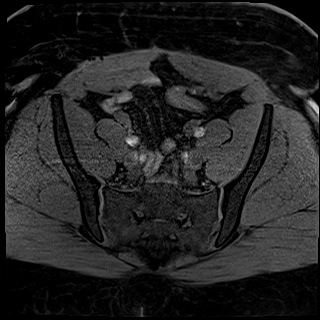
[im 90/120]
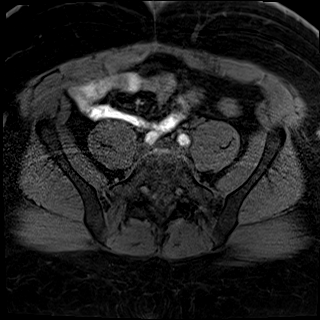
[im 105/120]
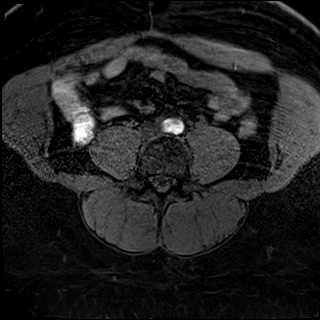
[im 120/120]
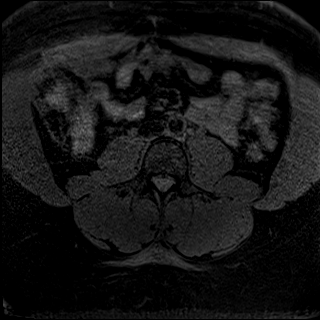

[Series 11: T1 fat-sat post-contrast · axial · 2.0mm · 0.75mm/px · z∈[-110,+128]mm · 8 of 120 slices shown]
[im 1/120]
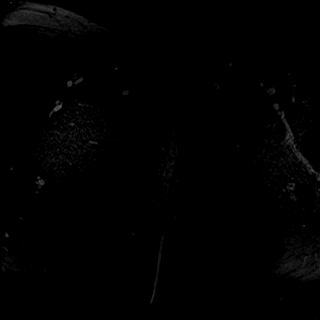
[im 15/120]
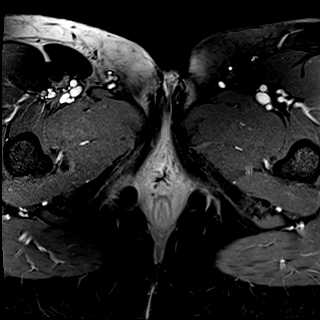
[im 30/120]
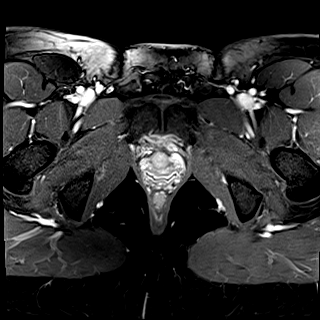
[im 45/120]
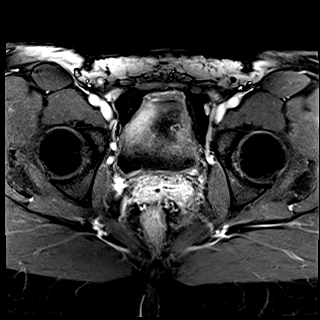
[im 75/120]
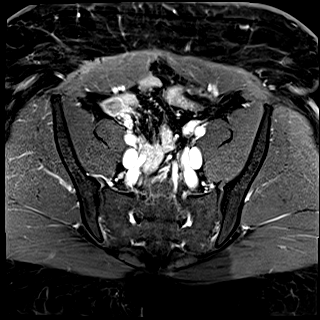
[im 90/120]
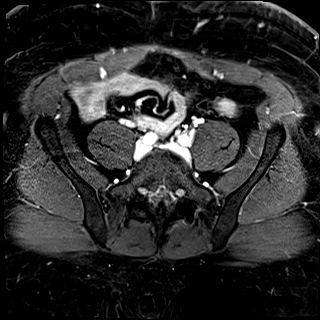
[im 105/120]
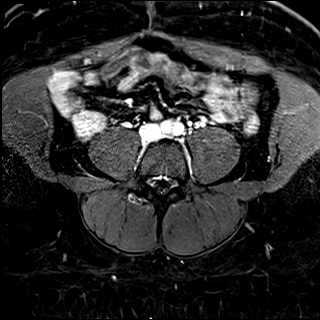
[im 120/120]
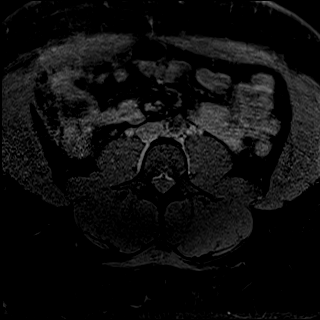

[Series 12: T1 fat-sat · coronal · 1.2mm · 0.75mm/px · 1 of 144 slices shown (2 of 2)]
[im 1/144]
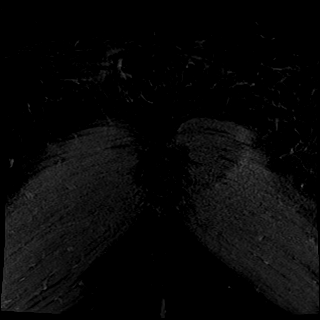

[34 of 48 positions shown; findings below may reference images not displayed]

FINDINGS: Uterus:  Surgically absent.

Endometrium:  Surgically absent.

Cervix/Vagina: Cervix surgically absent. Normal appearance of
vaginal cuff.

Right ovary: 1.8 cm benign appearing hemorrhagic cyst. No complex
cystic or solid masses identified.

Left ovary: A 2.1 cm benign appearing follicular cyst is noted, as
well as a smaller benign hemorrhagic cyst measuring 1.3 cm. No
complex cysts or solid masses identified.

Urinary Tract:  Bladder appears normal.

Bowel:  Unremarkable visualized pelvic bowel loops.

Vascular/Lymphatic: No pathologically enlarged lymph nodes. No
significant vascular abnormality seen.

Other:  None.

Musculoskeletal:  Unremarkable.
IMPRESSION: Small benign-appearing bilateral ovarian hemorrhagic cysts. No
evidence of ovarian neoplasm or other significant abnormality.

Previous hysterectomy.

## 2017-08-05 DIAGNOSIS — R350 Frequency of micturition: Secondary | ICD-10-CM | POA: Diagnosis not present

## 2017-08-05 DIAGNOSIS — N390 Urinary tract infection, site not specified: Secondary | ICD-10-CM | POA: Diagnosis not present

## 2017-08-05 DIAGNOSIS — F411 Generalized anxiety disorder: Secondary | ICD-10-CM | POA: Diagnosis not present

## 2017-08-05 DIAGNOSIS — R198 Other specified symptoms and signs involving the digestive system and abdomen: Secondary | ICD-10-CM | POA: Diagnosis not present

## 2017-10-12 DIAGNOSIS — E785 Hyperlipidemia, unspecified: Secondary | ICD-10-CM | POA: Diagnosis not present

## 2017-10-12 DIAGNOSIS — D72829 Elevated white blood cell count, unspecified: Secondary | ICD-10-CM | POA: Diagnosis not present

## 2017-10-12 DIAGNOSIS — N390 Urinary tract infection, site not specified: Secondary | ICD-10-CM | POA: Diagnosis not present

## 2017-10-12 DIAGNOSIS — E119 Type 2 diabetes mellitus without complications: Secondary | ICD-10-CM | POA: Diagnosis not present

## 2017-10-12 DIAGNOSIS — N200 Calculus of kidney: Secondary | ICD-10-CM | POA: Diagnosis not present

## 2017-10-12 DIAGNOSIS — N760 Acute vaginitis: Secondary | ICD-10-CM | POA: Diagnosis not present

## 2017-10-12 DIAGNOSIS — J019 Acute sinusitis, unspecified: Secondary | ICD-10-CM | POA: Diagnosis not present

## 2017-11-01 ENCOUNTER — Emergency Department (HOSPITAL_COMMUNITY)
Admission: EM | Admit: 2017-11-01 | Discharge: 2017-11-01 | Discharge status: Absent without leave | Payer: BLUE CROSS/BLUE SHIELD

## 2017-11-08 ENCOUNTER — Emergency Department (HOSPITAL_COMMUNITY): Payer: No Typology Code available for payment source

## 2017-11-08 ENCOUNTER — Emergency Department (HOSPITAL_COMMUNITY)
Admission: EM | Admit: 2017-11-08 | Discharge: 2017-11-08 | Disposition: A | Discharge status: Home / Self Care | Payer: No Typology Code available for payment source | Attending: Physician Assistant | Admitting: Physician Assistant

## 2017-11-08 ENCOUNTER — Encounter (HOSPITAL_COMMUNITY): Payer: Self-pay | Admitting: Emergency Medicine

## 2017-11-08 DIAGNOSIS — T300 Burn of unspecified body region, unspecified degree: Secondary | ICD-10-CM

## 2017-11-08 DIAGNOSIS — S6991XA Unspecified injury of right wrist, hand and finger(s), initial encounter: Secondary | ICD-10-CM | POA: Diagnosis present

## 2017-11-08 DIAGNOSIS — M7989 Other specified soft tissue disorders: Secondary | ICD-10-CM | POA: Diagnosis not present

## 2017-11-08 DIAGNOSIS — T23201A Burn of second degree of right hand, unspecified site, initial encounter: Secondary | ICD-10-CM | POA: Insufficient documentation

## 2017-11-08 DIAGNOSIS — Z23 Encounter for immunization: Secondary | ICD-10-CM | POA: Insufficient documentation

## 2017-11-08 DIAGNOSIS — Y990 Civilian activity done for income or pay: Secondary | ICD-10-CM | POA: Insufficient documentation

## 2017-11-08 DIAGNOSIS — F17210 Nicotine dependence, cigarettes, uncomplicated: Secondary | ICD-10-CM | POA: Insufficient documentation

## 2017-11-08 DIAGNOSIS — N189 Chronic kidney disease, unspecified: Secondary | ICD-10-CM | POA: Diagnosis not present

## 2017-11-08 DIAGNOSIS — Y9389 Activity, other specified: Secondary | ICD-10-CM | POA: Insufficient documentation

## 2017-11-08 DIAGNOSIS — X58XXXA Exposure to other specified factors, initial encounter: Secondary | ICD-10-CM | POA: Diagnosis not present

## 2017-11-08 DIAGNOSIS — Y9289 Other specified places as the place of occurrence of the external cause: Secondary | ICD-10-CM | POA: Insufficient documentation

## 2017-11-08 DIAGNOSIS — T23221A Burn of second degree of single right finger (nail) except thumb, initial encounter: Secondary | ICD-10-CM | POA: Diagnosis not present

## 2017-11-08 LAB — CBG MONITORING, ED: GLUCOSE-CAPILLARY: 105 mg/dL — AB (ref 65–99)

## 2017-11-08 MED ORDER — TETANUS-DIPHTH-ACELL PERTUSSIS 5-2.5-18.5 LF-MCG/0.5 IM SUSP
0.5000 mL | Freq: Once | INTRAMUSCULAR | Status: AC
  Administered 2017-11-08: 0.5 mL via INTRAMUSCULAR
  Filled 2017-11-08: qty 0.5

## 2017-11-08 MED ORDER — OXYCODONE-ACETAMINOPHEN 5-325 MG PO TABS: 1.0000 | ORAL_TABLET | Freq: Four times a day (QID) | ORAL | 0 refills | Status: DC | PRN

## 2017-11-08 MED ORDER — OXYCODONE-ACETAMINOPHEN 5-325 MG PO TABS
1.0000 | ORAL_TABLET | Freq: Once | ORAL | Status: AC
  Administered 2017-11-08: 1 via ORAL
  Filled 2017-11-08: qty 1

## 2017-11-08 MED ORDER — SILVER SULFADIAZINE 1 % EX CREA
TOPICAL_CREAM | Freq: Every day | CUTANEOUS | Status: DC
  Administered 2017-11-08: 23:00:00 via TOPICAL
  Filled 2017-11-08: qty 50

## 2017-11-08 NOTE — Discharge Instructions (Signed)
Please apply the burn cream twice a day.  Please keep it moist.  Keep it clean, and covered while at work.  Please be sure to stretch and continue to use it.  Please follow-up with dr as an outpatient as needed.  Please use ibuprofen and Tylenol he can use the stronger pain medication to help to sleep.

## 2017-11-08 NOTE — ED Notes (Signed)
Pt did not answer for triage.

## 2017-11-08 NOTE — ED Triage Notes (Signed)
Patient here from work with complaints of right pointer finger burn and injury. Reports that her finger got caught in in machine at work. Burns noted.

## 2017-11-08 NOTE — ED Notes (Signed)
Bed: [facility]2 Expected date:  Expected time:  Means of arrival:  Comments: 

## 2017-11-08 NOTE — ED Provider Notes (Signed)
[facility] DEPT Provider Note   CSN: [id number] Arrival date & time: 11/08/17  2040     History   Chief Complaint Chief Complaint  Patient presents with  . Hand Burn  . Finger Injury    HPI Maria [name] is a 37 y.o. female.  HPI  Patient is a 37 year old female presenting with burn to her right pointer finger after injury at work.  Patient was wearing gloves.  Burn to right finger.  It is on the palmar side crosses joint line at MCP.  Past Medical History:  Diagnosis Date  . Acid reflux   . Chronic kidney disease    kidney stones   . Fibroids   . Prediabetes     Patient Active Problem List   Diagnosis Date Noted  . Uterine leiomyoma 05/27/2015    Past Surgical History:  Procedure Laterality Date  . ABDOMINAL HYSTERECTOMY N/A 05/27/2015   Procedure: HYSTERECTOMY ABDOMINAL;  Surgeon: Janyth Pupa, DO;  Location: [facility];  Service: Gynecology;  Laterality: N/A;  669.1g   . BILATERAL SALPINGECTOMY Bilateral 05/27/2015   Procedure: BILATERAL SALPINGECTOMY;  Surgeon: Janyth Pupa, DO;  Location: [facility];  Service: Gynecology;  Laterality: Bilateral;  . CESAREAN SECTION      OB History    No data available       Home Medications    Prior to Admission medications   Medication Sig Start Date End Date Taking? Authorizing Provider  albuterol (PROVENTIL HFA;VENTOLIN HFA) 108 (90 BASE) MCG/ACT inhaler Inhale 1-2 puffs into the lungs every 6 (six) hours as needed for wheezing or shortness of breath. 10/10/15   Lacretia Leigh, MD  ibuprofen (ADVIL,MOTRIN) 200 MG tablet Take 200 mg by mouth every 6 (six) hours as needed for moderate pain.    [provider]  methocarbamol (ROBAXIN) 500 MG tablet Take 1 tablet (500 mg total) by mouth 3 (three) times daily between meals as needed. 06/07/17   Tanna Furry, MD  predniSONE (DELTASONE) 20 MG tablet Take 2 tablets (40 mg total) by mouth daily. 10/10/15   Lacretia Leigh, MD    Family  History No family history on file.  Social History Social History   Tobacco Use  . Smoking status: Current Some Day Smoker    Packs/day: 0.25    Types: Cigarettes  . Smokeless tobacco: Never Used  Substance Use Topics  . Alcohol use: Yes    Comment: social   . Drug use: No     Allergies   Patient has no known allergies.   Review of Systems Review of Systems  Constitutional: Negative for activity change.  Cardiovascular: Negative for chest pain.  Gastrointestinal: Negative for abdominal pain.     Physical Exam Updated Vital Signs BP (!) 136/99 (BP Location: Right Arm)   Pulse 86   Temp 98.4 F (36.9 C) (Oral)   Resp 18   LMP 05/12/2015 (Exact Date)   SpO2 100%   Physical Exam  Constitutional: She is oriented to person, place, and time. She appears well-developed and well-nourished.  HENT:  Head: Normocephalic and atraumatic.  Eyes: Right eye exhibits no discharge. Left eye exhibits no discharge.  Cardiovascular: Normal rate and regular rhythm.  Pulmonary/Chest: Effort normal and breath sounds normal.  Neurological: She is oriented to person, place, and time.  Skin: Skin is warm and dry. She is not diaphoretic.  Burn to R plamar hand. 2nd degree.   Psychiatric: She has a normal mood and affect.  Nursing note and  vitals reviewed.        ED Treatments / Results  Labs (all labs ordered are listed, but only abnormal results are displayed) Labs Reviewed  CBG MONITORING, ED - Abnormal; Notable for the following components:      Result Value   Glucose-Capillary 105 (*)    All other components within normal limits    EKG  EKG Interpretation None       Radiology No results found.  Procedures Procedures (including critical care time)  Medications Ordered in ED Medications  Tdap (BOOSTRIX) injection 0.5 mL (not administered)  oxyCODONE-acetaminophen (PERCOCET/ROXICET) 5-325 MG per tablet 1 tablet (1 tablet Oral Given 11/08/17 2137)     Initial  Impression / Assessment and Plan / ED Course  I have reviewed the triage vital signs and the nursing notes.  Pertinent labs & imaging results that were available during my care of the patient were reviewed by me and considered in my medical decision making (see chart for details).    Patient is a 36 year old female presenting with burn to her right pointer finger after injury at work.  Patient was wearing gloves.  Burn to right finger.  It is on the palmar side crosses joint line at MCP.  9:50 PM Will get follow-up with plastics.  Update tetanus.   Final Clinical Impressions(s) / ED Diagnoses   Final diagnoses:  None    ED Discharge Orders    None       Macarthur Critchley, MD 11/08/17 2226

## 2017-11-21 DIAGNOSIS — R351 Nocturia: Secondary | ICD-10-CM | POA: Diagnosis not present

## 2017-11-21 DIAGNOSIS — N3021 Other chronic cystitis with hematuria: Secondary | ICD-10-CM | POA: Diagnosis not present

## 2017-12-23 DIAGNOSIS — J09X2 Influenza due to identified novel influenza A virus with other respiratory manifestations: Secondary | ICD-10-CM | POA: Diagnosis not present

## 2018-01-02 DIAGNOSIS — R05 Cough: Secondary | ICD-10-CM | POA: Diagnosis not present

## 2018-01-02 DIAGNOSIS — J019 Acute sinusitis, unspecified: Secondary | ICD-10-CM | POA: Diagnosis not present

## 2018-04-02 ENCOUNTER — Other Ambulatory Visit (HOSPITAL_COMMUNITY)
Admission: RE | Admit: 2018-04-02 | Discharge: 2018-04-02 | Disposition: A | Discharge status: Home / Self Care | Payer: BLUE CROSS/BLUE SHIELD | Source: Ambulatory Visit | Attending: Family Medicine | Admitting: Family Medicine

## 2018-04-02 ENCOUNTER — Other Ambulatory Visit: Payer: Self-pay | Admitting: Family Medicine

## 2018-04-02 DIAGNOSIS — F17210 Nicotine dependence, cigarettes, uncomplicated: Secondary | ICD-10-CM | POA: Diagnosis not present

## 2018-04-02 DIAGNOSIS — E119 Type 2 diabetes mellitus without complications: Secondary | ICD-10-CM | POA: Diagnosis not present

## 2018-04-02 DIAGNOSIS — Z0000 Encounter for general adult medical examination without abnormal findings: Secondary | ICD-10-CM | POA: Diagnosis not present

## 2018-04-02 DIAGNOSIS — Z01419 Encounter for gynecological examination (general) (routine) without abnormal findings: Secondary | ICD-10-CM | POA: Insufficient documentation

## 2018-04-02 DIAGNOSIS — Z136 Encounter for screening for cardiovascular disorders: Secondary | ICD-10-CM | POA: Diagnosis not present

## 2018-04-04 LAB — CYTOLOGY - PAP: DIAGNOSIS: NEGATIVE

## 2018-04-06 DIAGNOSIS — F17210 Nicotine dependence, cigarettes, uncomplicated: Secondary | ICD-10-CM | POA: Diagnosis not present

## 2018-04-06 DIAGNOSIS — E119 Type 2 diabetes mellitus without complications: Secondary | ICD-10-CM | POA: Diagnosis not present

## 2018-04-06 DIAGNOSIS — B079 Viral wart, unspecified: Secondary | ICD-10-CM | POA: Diagnosis not present

## 2018-04-06 DIAGNOSIS — F411 Generalized anxiety disorder: Secondary | ICD-10-CM | POA: Diagnosis not present

## 2018-05-31 ENCOUNTER — Other Ambulatory Visit: Payer: Self-pay

## 2018-05-31 ENCOUNTER — Encounter (HOSPITAL_COMMUNITY): Payer: Self-pay

## 2018-05-31 ENCOUNTER — Emergency Department (HOSPITAL_COMMUNITY)
Admission: EM | Admit: 2018-05-31 | Discharge: 2018-05-31 | Disposition: A | Discharge status: Home / Self Care | Payer: BLUE CROSS/BLUE SHIELD | Attending: Emergency Medicine | Admitting: Emergency Medicine

## 2018-05-31 ENCOUNTER — Emergency Department (HOSPITAL_COMMUNITY): Payer: BLUE CROSS/BLUE SHIELD

## 2018-05-31 DIAGNOSIS — Z79899 Other long term (current) drug therapy: Secondary | ICD-10-CM | POA: Insufficient documentation

## 2018-05-31 DIAGNOSIS — N189 Chronic kidney disease, unspecified: Secondary | ICD-10-CM | POA: Insufficient documentation

## 2018-05-31 DIAGNOSIS — E119 Type 2 diabetes mellitus without complications: Secondary | ICD-10-CM | POA: Insufficient documentation

## 2018-05-31 DIAGNOSIS — F17210 Nicotine dependence, cigarettes, uncomplicated: Secondary | ICD-10-CM | POA: Diagnosis not present

## 2018-05-31 DIAGNOSIS — R079 Chest pain, unspecified: Secondary | ICD-10-CM | POA: Insufficient documentation

## 2018-05-31 DIAGNOSIS — R072 Precordial pain: Secondary | ICD-10-CM | POA: Diagnosis not present

## 2018-05-31 HISTORY — DX: Type 2 diabetes mellitus without complications: E11.9

## 2018-05-31 LAB — CBC
HCT: 41.8 % (ref 36.0–46.0)
Hemoglobin: 13.7 g/dL (ref 12.0–15.0)
MCH: 28 pg (ref 26.0–34.0)
MCHC: 32.8 g/dL (ref 30.0–36.0)
MCV: 85.5 fL (ref 78.0–100.0)
PLATELETS: 207 10*3/uL (ref 150–400)
RBC: 4.89 MIL/uL (ref 3.87–5.11)
RDW: 13.6 % (ref 11.5–15.5)
WBC: 8.8 10*3/uL (ref 4.0–10.5)

## 2018-05-31 LAB — BASIC METABOLIC PANEL
ANION GAP: 10 (ref 5–15)
BUN: 16 mg/dL (ref 6–20)
CHLORIDE: 105 mmol/L (ref 98–111)
CO2: 24 mmol/L (ref 22–32)
CREATININE: 0.94 mg/dL (ref 0.44–1.00)
Calcium: 9.7 mg/dL (ref 8.9–10.3)
GFR calc non Af Amer: >60 mL/min (ref >60)
Glucose, Bld: 146 mg/dL — ABNORMAL HIGH (ref 70–99)
Potassium: 3.9 mmol/L (ref 3.5–5.1)
Sodium: 139 mmol/L (ref 135–145)

## 2018-05-31 LAB — I-STAT BETA HCG BLOOD, ED (MC, WL, AP ONLY)

## 2018-05-31 LAB — I-STAT TROPONIN, ED: TROPONIN I, POC: 0 ng/mL (ref 0.00–0.08)

## 2018-05-31 LAB — CBG MONITORING, ED: Glucose-Capillary: 152 mg/dL — ABNORMAL HIGH (ref 70–99)

## 2018-05-31 MED ORDER — RANITIDINE HCL 150 MG PO CAPS: 150.0000 mg | ORAL_CAPSULE | Freq: Every day | ORAL | 0 refills | Status: DC

## 2018-05-31 MED ORDER — PANTOPRAZOLE SODIUM 20 MG PO TBEC: 20.0000 mg | DELAYED_RELEASE_TABLET | Freq: Every day | ORAL | 0 refills | Status: DC

## 2018-05-31 MED ORDER — RANITIDINE HCL 150 MG/10ML PO SYRP
150.0000 mg | ORAL_SOLUTION | Freq: Once | ORAL | Status: AC
  Administered 2018-05-31: 150 mg via ORAL
  Filled 2018-05-31: qty 10

## 2018-05-31 MED ORDER — GI COCKTAIL ~~LOC~~
30.0000 mL | Freq: Once | ORAL | Status: AC
  Administered 2018-05-31: 30 mL via ORAL
  Filled 2018-05-31: qty 30

## 2018-05-31 MED ORDER — PANTOPRAZOLE SODIUM 40 MG PO TBEC
40.0000 mg | DELAYED_RELEASE_TABLET | Freq: Every day | ORAL | Status: DC
  Administered 2018-05-31: 40 mg via ORAL
  Filled 2018-05-31: qty 1

## 2018-05-31 NOTE — ED Notes (Signed)
ED Provider at bedside. 

## 2018-05-31 NOTE — ED Triage Notes (Signed)
Pt states that Sunday she started having shooting pain in her arm and chest with a migraine. Pt reports she thought that she was dehydrated so she drank Gatorade and took ibuprofen but has since had increased weakness with nausea and chills. Also states that she passed kidney stone one month ago, reports that she is a diabetic and takes metformin but does not check her blood sugar at home.

## 2018-05-31 NOTE — ED Notes (Signed)
Pharmacy reviewed w patient prior to discharge. Pt denied any further questions/needs.

## 2018-06-01 NOTE — ED Provider Notes (Signed)
[facility] EMERGENCY DEPARTMENT Provider Note   CSN: [id number] Arrival date & time: 05/31/18  1619     History   Chief Complaint Chief Complaint  Patient presents with  . Chest Pain    HPI Maria [name] is a 37 y.o. female.   Chest Pain   This is a new problem. The current episode started more than 1 week ago. The problem occurs daily. The pain is associated with eating. The pain is present in the substernal region and lateral region. The pain is mild. The quality of the pain is described as sharp.    Past Medical History:  Diagnosis Date  . Acid reflux   . Chronic kidney disease    kidney stones   . Diabetes mellitus without complication ([place])   . Fibroids   . Prediabetes     Patient Active Problem List   Diagnosis Date Noted  . Uterine leiomyoma 05/27/2015    Past Surgical History:  Procedure Laterality Date  . ABDOMINAL HYSTERECTOMY N/A 05/27/2015   Procedure: HYSTERECTOMY ABDOMINAL;  Surgeon: Janyth Pupa, DO;  Location: [facility];  Service: Gynecology;  Laterality: N/A;  669.1g   . BILATERAL SALPINGECTOMY Bilateral 05/27/2015   Procedure: BILATERAL SALPINGECTOMY;  Surgeon: Janyth Pupa, DO;  Location: [facility];  Service: Gynecology;  Laterality: Bilateral;  . CESAREAN SECTION       OB History   None      Home Medications    Prior to Admission medications   Medication Sig Start Date End Date Taking? Authorizing Provider  albuterol (PROVENTIL HFA;VENTOLIN HFA) 108 (90 BASE) MCG/ACT inhaler Inhale 1-2 puffs into the lungs every 6 (six) hours as needed for wheezing or shortness of breath. 10/10/15   Lacretia Leigh, MD  ibuprofen (ADVIL,MOTRIN) 200 MG tablet Take 200 mg by mouth every 6 (six) hours as needed for moderate pain.    [provider]  methocarbamol (ROBAXIN) 500 MG tablet Take 1 tablet (500 mg total) by mouth 3 (three) times daily between meals as needed. 06/07/17   Tanna Furry, MD  oxyCODONE-acetaminophen  (PERCOCET/ROXICET) 5-325 MG tablet Take 1 tablet by mouth every 6 (six) hours as needed for severe pain. 11/08/17   Mackuen, Courteney Lyn, MD  pantoprazole (PROTONIX) 20 MG tablet Take 1 tablet (20 mg total) by mouth daily. 05/31/18   Ellianne Gowen, Corene Cornea, MD  predniSONE (DELTASONE) 20 MG tablet Take 2 tablets (40 mg total) by mouth daily. 10/10/15   Lacretia Leigh, MD  ranitidine (ZANTAC) 150 MG capsule Take 1 capsule (150 mg total) by mouth daily. 05/31/18   Jowell Bossi, Corene Cornea, MD    Family History No family history on file.  Social History Social History   Tobacco Use  . Smoking status: Current Some Day Smoker    Packs/day: 0.25    Types: Cigarettes  . Smokeless tobacco: Never Used  Substance Use Topics  . Alcohol use: Yes    Comment: social   . Drug use: No     Allergies   Patient has no known allergies.   Review of Systems Review of Systems  Cardiovascular: Positive for chest pain.  All other systems reviewed and are negative.    Physical Exam Updated Vital Signs BP 118/89   Pulse 62   Temp 98.3 F (36.8 C) (Oral)   Resp 15   Ht 4\' 9"  (1.448 m)   Wt 71.7 kg (158 lb)   LMP 05/12/2015 (Exact Date)   SpO2 99%   BMI 34.19 kg/m  Physical Exam  Constitutional: She is oriented to person, place, and time. She appears well-developed and well-nourished.  HENT:  Head: Normocephalic and atraumatic.  Eyes: Conjunctivae and EOM are normal.  Neck: Normal range of motion.  Cardiovascular: Normal rate and regular rhythm.  Pulmonary/Chest: No stridor. No respiratory distress.  Abdominal: Soft. Bowel sounds are normal. She exhibits no distension. There is no tenderness.  Musculoskeletal: She exhibits no edema or deformity.  Neurological: She is alert and oriented to person, place, and time. No cranial nerve deficit.  Skin: Skin is warm and dry.  Nursing note and vitals reviewed.    ED Treatments / Results  Labs (all labs ordered are listed, but only abnormal results are  displayed) Labs Reviewed  BASIC METABOLIC PANEL - Abnormal; Notable for the following components:      Result Value   Glucose, Bld 146 (*)    All other components within normal limits  CBG MONITORING, ED - Abnormal; Notable for the following components:   Glucose-Capillary 152 (*)    All other components within normal limits  CBC  I-STAT TROPONIN, ED  I-STAT BETA HCG BLOOD, ED ([facility], [facility], [facility] ONLY)    EKG EKG Interpretation  Date/Time:  Thursday May 31 2018 16:56:17 EDT Ventricular Rate:  83 PR Interval:  124 QRS Duration: 74 QT Interval:  376 QTC Calculation: 441 R Axis:   74 Text Interpretation:  Normal sinus rhythm Normal ECG No significant change since last tracing Confirmed by Merrily Pew [phone]) on 05/31/2018 7:01:05 PM   Radiology Dg Chest 2 View  Result Date: 05/31/2018 CLINICAL DATA:  37 year old female with chest pain for 1-2 weeks. Pain radiating to the left neck and arm. EXAM: CHEST - 2 VIEW COMPARISON:  Chest radiographs 06/07/2017 and earlier. FINDINGS: Lung volumes and mediastinal contours remain within normal limits. Visualized tracheal air column is within normal limits. No pneumothorax, pulmonary edema, pleural effusion or confluent pulmonary opacity. Stable mild increased interstitial markings. No osseous abnormality identified. Negative visible bowel gas pattern. IMPRESSION: No acute cardiopulmonary abnormality. Electronically Signed   By: Genevie Ann M.D.   On: 05/31/2018 17:16    Procedures Procedures (including critical care time)  Medications Ordered in ED Medications  gi cocktail (Maalox,Lidocaine,Donnatal) (30 mLs Oral Given 05/31/18 1936)  ranitidine (ZANTAC) 150 MG/10ML syrup 150 mg (150 mg Oral Given 05/31/18 1936)     Initial Impression / Assessment and Plan / ED Course  I have reviewed the triage vital signs and the nursing notes.  Pertinent labs & imaging results that were available during my care of the patient were reviewed by me and considered in  my medical decision making (see chart for details).     Suspect gastritis/esophagitis/pud as cause. Improved prior to dc. Started  H2/ppi. pcp follow up for resolution. Low suspicion for acs/pe/emergent causes for chest pain.   Final Clinical Impressions(s) / ED Diagnoses   Final diagnoses:  Nonspecific chest pain    ED Discharge Orders        Ordered    ranitidine (ZANTAC) 150 MG capsule  Daily     05/31/18 2217    pantoprazole (PROTONIX) 20 MG tablet  Daily     05/31/18 2217       Traivon Morrical, Corene Cornea, MD 06/01/18 [phone]

## 2018-08-14 DIAGNOSIS — R51 Headache: Secondary | ICD-10-CM | POA: Diagnosis not present

## 2018-08-14 DIAGNOSIS — F411 Generalized anxiety disorder: Secondary | ICD-10-CM | POA: Diagnosis not present

## 2018-08-14 DIAGNOSIS — R079 Chest pain, unspecified: Secondary | ICD-10-CM | POA: Diagnosis not present

## 2018-08-14 DIAGNOSIS — R413 Other amnesia: Secondary | ICD-10-CM | POA: Diagnosis not present

## 2018-08-21 ENCOUNTER — Encounter: Payer: Self-pay | Admitting: Neurology

## 2018-08-21 ENCOUNTER — Ambulatory Visit: Payer: BLUE CROSS/BLUE SHIELD | Admitting: Neurology

## 2018-08-21 VITALS — BP 106/70 | HR 89 | Ht <= 58 in | Wt 160.0 lb

## 2018-08-21 DIAGNOSIS — IMO0002 Reserved for concepts with insufficient information to code with codable children: Secondary | ICD-10-CM | POA: Insufficient documentation

## 2018-08-21 DIAGNOSIS — R413 Other amnesia: Secondary | ICD-10-CM | POA: Insufficient documentation

## 2018-08-21 DIAGNOSIS — G43709 Chronic migraine without aura, not intractable, without status migrainosus: Secondary | ICD-10-CM

## 2018-08-21 MED ORDER — ONDANSETRON 4 MG PO TBDP: 4.0000 mg | ORAL_TABLET | Freq: Three times a day (TID) | ORAL | 6 refills | Status: DC | PRN

## 2018-08-21 MED ORDER — SUMATRIPTAN SUCCINATE 100 MG PO TABS: 100.0000 mg | ORAL_TABLET | Freq: Once | ORAL | 6 refills | Status: DC | PRN

## 2018-08-21 MED ORDER — NORTRIPTYLINE HCL 25 MG PO CAPS: 50.0000 mg | ORAL_CAPSULE | Freq: Every day | ORAL | 11 refills | Status: DC

## 2018-08-21 NOTE — Progress Notes (Signed)
PATIENT: Maria [name] DOB: [birth year]  Chief Complaint  Patient presents with  . Headache    She is here with her partner, [name]. Reports headaches nearly everyday.  She takes aspirin 325mg  daily for the pain.  She rarely takes ibuprofen.  She uses Fioricet for her most painful headaches.  She denies blurred vision, dizziness or nausea.  Marland Kitchen PCP    Donald Prose, MD     HISTORICAL  Maria [name] is a 37 year old female, seen in request by her primary  care doctor Donald Prose for evaluation of headache, initial evaluation was on August 21, 2018.  She is accompanied by her partner [name] at today's clinical visit.  I have reviewed and summarized the referring note from the referring physician.  She had a past medical history of diabetes, depression, hyperlipidemia,  She reported a history of headaches since 2017, gradually increased frequency, now she is having headaches 325 times each week, has used frequent over-the-counter medications, aspirin, Fioricet as needed was helpful, she works at [employer] job, working at noisy and bright light environment, which often trigger her headaches, headache bilateral frontal, occipital area, pressure, sometimes with thrombin, lasting for few hours to couple days, she has been taking aspirin 325 mg 6 to 8 tablets frequently, ibuprofen up to 12 tablets each time, and multiple caffeine beverage each day  She complains of difficulty sleeping, has depression, taking Lexapro 20 mg every day, Valium 5 mg as needed,  REVIEW OF SYSTEMS: Full 14 system review of systems performed and notable only for joint pain, aching muscles, frequent infection, chest pain, headaches, numbness, insomnia, sleepiness, shiftwork, depression, anxiety, decreased energy All other review of systems were negative.  ALLERGIES: No Known Allergies  HOME MEDICATIONS: Current Outpatient Medications  Medication Sig Dispense Refill  . albuterol (PROVENTIL HFA;VENTOLIN HFA) 108  (90 BASE) MCG/ACT inhaler Inhale 1-2 puffs into the lungs every 6 (six) hours as needed for wheezing or shortness of breath. 1 Inhaler 0  . aspirin 325 MG tablet Take 325 mg by mouth daily.    . butalbital-acetaminophen-caffeine (FIORICET WITH CODEINE) 50-325-40-30 MG capsule Take 1 capsule by mouth as needed for headache.    . diazepam (VALIUM) 5 MG tablet Take 10 mg by mouth at bedtime as needed for anxiety.    Marland Kitchen escitalopram (LEXAPRO) 20 MG tablet Take 20 mg by mouth daily.    Marland Kitchen ibuprofen (ADVIL,MOTRIN) 200 MG tablet Take 200 mg by mouth every 6 (six) hours as needed for moderate pain.    . metFORMIN (GLUCOPHAGE-XR) 500 MG 24 hr tablet Take 500 mg by mouth 2 (two) times daily.    . pantoprazole (PROTONIX) 20 MG tablet Take 1 tablet (20 mg total) by mouth daily. 15 tablet 0  . simvastatin (ZOCOR) 10 MG tablet Take 10 mg by mouth daily.     No current facility-administered medications for this visit.     PAST MEDICAL HISTORY: Past Medical History:  Diagnosis Date  . Acid reflux   . Chronic kidney disease    kidney stones   . Diabetes mellitus without complication ([place])   . Dysmenorrhea   . Fibroids   . Headache   . Kidney stone     PAST SURGICAL HISTORY: Past Surgical History:  Procedure Laterality Date  . ABDOMINAL HYSTERECTOMY N/A 05/27/2015   Procedure: HYSTERECTOMY ABDOMINAL;  Surgeon: Janyth Pupa, DO;  Location: [facility];  Service: Gynecology;  Laterality: N/A;  669.1g   . BILATERAL SALPINGECTOMY Bilateral 05/27/2015  Procedure: BILATERAL SALPINGECTOMY;  Surgeon: Janyth Pupa, DO;  Location: [facility];  Service: Gynecology;  Laterality: Bilateral;  . CESAREAN SECTION    . LITHOTRIPSY      FAMILY HISTORY: Family History  Problem Relation Age of Onset  . Diabetes Mother   . Hypertension Mother   . Heart attack Mother   . Healthy Father     SOCIAL HISTORY: Social History   Socioeconomic History  . Marital status: Single    Spouse name: Not on file  . Number of  children: 1  . Years of education: some college  . Highest education level: Not on file  Occupational History  . Occupation: works in Immunologist  Social Needs  . Financial resource strain: Not on file  . Food insecurity:    Worry: Not on file    Inability: Not on file  . Transportation needs:    Medical: Not on file    Non-medical: Not on file  Tobacco Use  . Smoking status: Current Every Day Smoker    Packs/day: 0.50    Types: Cigarettes  . Smokeless tobacco: Never Used  Substance and Sexual Activity  . Alcohol use: Yes    Comment: social   . Drug use: No  . Sexual activity: Not on file  Lifestyle  . Physical activity:    Days per week: Not on file    Minutes per session: Not on file  . Stress: Not on file  Relationships  . Social connections:    Talks on phone: Not on file    Gets together: Not on file    Attends religious service: Not on file    Active member of club or organization: Not on file    Attends meetings of clubs or organizations: Not on file    Relationship status: Not on file  . Intimate partner violence:    Fear of current or ex partner: Not on file    Emotionally abused: Not on file    Physically abused: Not on file    Forced sexual activity: Not on file  Other Topics Concern  . Not on file  Social History Narrative   Lives at home with her partner, [name] and children.   Right-handed.   One cup caffeine per day.     PHYSICAL EXAM   Vitals:   08/21/18 1300  BP: 106/70  Pulse: 89  Weight: 160 lb (72.6 kg)  Height: 4\' 9"  (1.448 m)    Not recorded      Body mass index is 34.62 kg/m.  PHYSICAL EXAMNIATION:  Gen: NAD, conversant, well nourised, obese, well groomed                     Cardiovascular: Regular rate rhythm, no peripheral edema, warm, nontender. Eyes: Conjunctivae clear without exudates or hemorrhage Neck: Supple, no carotid bruits. Pulmonary: Clear to auscultation bilaterally   NEUROLOGICAL EXAM:  MENTAL  STATUS: Speech:    Speech is normal; fluent and spontaneous with normal comprehension.  Cognition:     Orientation to time, place and person     Normal recent and remote memory     Normal Attention span and concentration     Normal Language, naming, repeating,spontaneous speech     Fund of knowledge   CRANIAL NERVES: CN II: Visual fields are full to confrontation. Fundoscopic exam is normal with sharp discs and no vascular changes. Pupils are round equal and briskly reactive to light. CN III, IV, VI: extraocular  movement are normal. No ptosis. CN V: Facial sensation is intact to pinprick in all 3 divisions bilaterally. Corneal responses are intact.  CN VII: Face is symmetric with normal eye closure and smile. CN VIII: Hearing is normal to rubbing fingers CN IX, X: Palate elevates symmetrically. Phonation is normal. CN XI: Head turning and shoulder shrug are intact CN XII: Tongue is midline with normal movements and no atrophy.  MOTOR: There is no pronator drift of out-stretched arms. Muscle bulk and tone are normal. Muscle strength is normal.  REFLEXES: Reflexes are 2+ and symmetric at the biceps, triceps, knees, and ankles. Plantar responses are flexor.  SENSORY: Intact to light touch, pinprick, positional sensation and vibratory sensation are intact in fingers and toes.  COORDINATION: Rapid alternating movements and fine finger movements are intact. There is no dysmetria on finger-to-nose and heel-knee-shin.    GAIT/STANCE: Posture is normal. Gait is steady with normal steps, base, arm swing, and turning. Heel and toe walking are normal. Tandem gait is normal.  Romberg is absent.   DIAGNOSTIC DATA (LABS, IMAGING, TESTING) - I reviewed patient records, labs, notes, testing and imaging myself where available.   ASSESSMENT AND PLAN  Maria [name] is a 37 y.o. female   Chronic migraine headaches A component of medicine rebound headaches  I emphasized the importance of  tapering off daily over-the-counter medication  Laboratory evaluations including TSH  Nortriptyline 25 titrating to 50 mg at night as preventive medications  imitrex 100mg  as needed.     Marcial Pacas, M.D. Ph.D.  [facility] [address], [place], [place] [address] Ph: [phone] Fax: [phone]  CC: Donald Prose, MD

## 2018-08-22 ENCOUNTER — Telehealth: Payer: Self-pay | Admitting: *Deleted

## 2018-08-22 LAB — TSH: TSH: 2.27 u[IU]/mL (ref 0.450–4.500)

## 2018-08-22 LAB — VITAMIN B12: Vitamin B-12: 617 pg/mL (ref 232–1245)

## 2018-08-22 LAB — RPR: RPR Ser Ql: NONREACTIVE

## 2018-08-22 NOTE — Telephone Encounter (Signed)
-----   Message from Marcial Pacas, MD sent at 08/22/2018  1:41 PM EDT ----- Please call patient for normal laboratory result

## 2018-08-22 NOTE — Telephone Encounter (Signed)
Left patient a detailed message, with results, on voicemail (ok per DPR).  Provided our number to call back with any questions.  

## 2018-10-19 DIAGNOSIS — N760 Acute vaginitis: Secondary | ICD-10-CM | POA: Diagnosis not present

## 2018-10-19 DIAGNOSIS — R05 Cough: Secondary | ICD-10-CM | POA: Diagnosis not present

## 2018-10-19 DIAGNOSIS — J019 Acute sinusitis, unspecified: Secondary | ICD-10-CM | POA: Diagnosis not present

## 2018-11-26 ENCOUNTER — Ambulatory Visit: Payer: BLUE CROSS/BLUE SHIELD | Admitting: Neurology

## 2018-12-21 IMAGING — DX DG FINGER INDEX 2+V*R*
3 series · 3 of 3 positions shown · non-contrast
Comparison: None.

CLINICAL DATA: Right index finger burn and injury.

EXAM:
RIGHT INDEX FINGER 2+V

[finger ap]
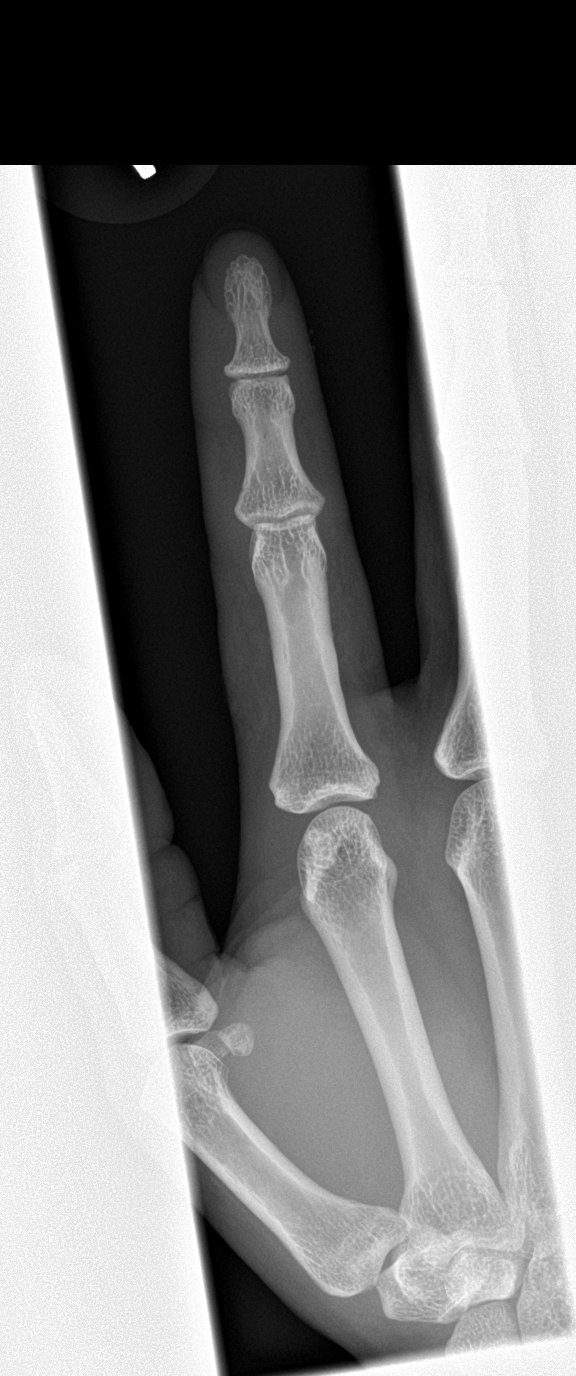

[finger obl]
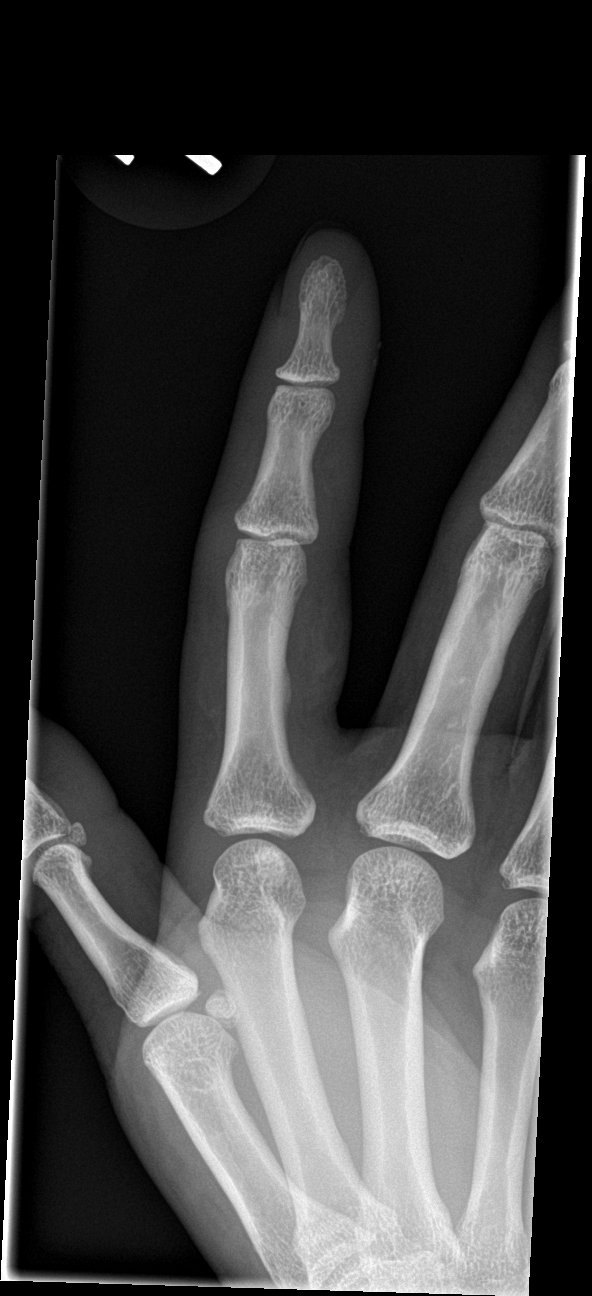

[finger lat]
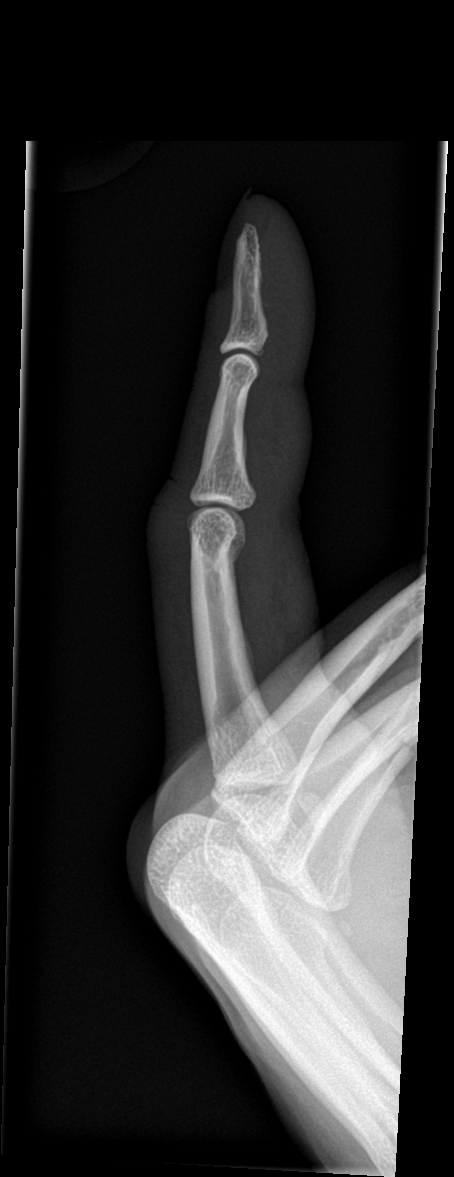

[3 of 3 positions shown; findings below may reference images not displayed]

FINDINGS: There is no evidence of fracture or dislocation. There is no
evidence of arthropathy or other focal bone abnormality. Minimal
soft tissue irregularity along the ulnar aspect of the right index
finger adjacent to the distal phalanx with mild soft tissue
swelling.
IMPRESSION: Mild soft tissue swelling and probable burn related soft tissue
irregularity along the distal right index finger. No acute osseous
abnormality.

## 2019-05-29 DIAGNOSIS — F419 Anxiety disorder, unspecified: Secondary | ICD-10-CM | POA: Diagnosis not present

## 2019-05-29 DIAGNOSIS — G44209 Tension-type headache, unspecified, not intractable: Secondary | ICD-10-CM | POA: Diagnosis not present

## 2019-06-24 DIAGNOSIS — B349 Viral infection, unspecified: Secondary | ICD-10-CM | POA: Diagnosis not present

## 2019-06-24 DIAGNOSIS — R509 Fever, unspecified: Secondary | ICD-10-CM | POA: Diagnosis not present

## 2019-06-25 DIAGNOSIS — R509 Fever, unspecified: Secondary | ICD-10-CM | POA: Diagnosis not present

## 2019-06-25 DIAGNOSIS — J029 Acute pharyngitis, unspecified: Secondary | ICD-10-CM | POA: Diagnosis not present

## 2019-09-30 ENCOUNTER — Other Ambulatory Visit: Payer: Self-pay

## 2019-09-30 DIAGNOSIS — Z20822 Contact with and (suspected) exposure to covid-19: Secondary | ICD-10-CM

## 2019-10-01 LAB — NOVEL CORONAVIRUS, NAA: SARS-CoV-2, NAA: NOT DETECTED

## 2019-12-16 DIAGNOSIS — R509 Fever, unspecified: Secondary | ICD-10-CM | POA: Diagnosis not present

## 2019-12-16 DIAGNOSIS — J0100 Acute maxillary sinusitis, unspecified: Secondary | ICD-10-CM | POA: Diagnosis not present

## 2019-12-16 DIAGNOSIS — Z03818 Encounter for observation for suspected exposure to other biological agents ruled out: Secondary | ICD-10-CM | POA: Diagnosis not present

## 2019-12-16 DIAGNOSIS — J029 Acute pharyngitis, unspecified: Secondary | ICD-10-CM | POA: Diagnosis not present

## 2020-01-14 DIAGNOSIS — Z113 Encounter for screening for infections with a predominantly sexual mode of transmission: Secondary | ICD-10-CM | POA: Diagnosis not present

## 2020-01-14 DIAGNOSIS — Z0000 Encounter for general adult medical examination without abnormal findings: Secondary | ICD-10-CM | POA: Diagnosis not present

## 2020-01-14 DIAGNOSIS — F411 Generalized anxiety disorder: Secondary | ICD-10-CM | POA: Diagnosis not present

## 2020-01-14 DIAGNOSIS — E118 Type 2 diabetes mellitus with unspecified complications: Secondary | ICD-10-CM | POA: Diagnosis not present

## 2020-01-14 DIAGNOSIS — Z6832 Body mass index (BMI) 32.0-32.9, adult: Secondary | ICD-10-CM | POA: Diagnosis not present

## 2020-01-14 DIAGNOSIS — Z1159 Encounter for screening for other viral diseases: Secondary | ICD-10-CM | POA: Diagnosis not present

## 2020-03-05 DIAGNOSIS — R112 Nausea with vomiting, unspecified: Secondary | ICD-10-CM | POA: Diagnosis not present

## 2020-07-29 DIAGNOSIS — F419 Anxiety disorder, unspecified: Secondary | ICD-10-CM | POA: Diagnosis not present

## 2020-07-29 DIAGNOSIS — F430 Acute stress reaction: Secondary | ICD-10-CM | POA: Diagnosis not present

## 2020-08-28 DIAGNOSIS — G4700 Insomnia, unspecified: Secondary | ICD-10-CM | POA: Diagnosis not present

## 2020-08-28 DIAGNOSIS — F419 Anxiety disorder, unspecified: Secondary | ICD-10-CM | POA: Diagnosis not present

## 2020-08-28 DIAGNOSIS — F430 Acute stress reaction: Secondary | ICD-10-CM | POA: Diagnosis not present

## 2020-09-29 DIAGNOSIS — F320 Major depressive disorder, single episode, mild: Secondary | ICD-10-CM | POA: Diagnosis not present

## 2020-09-29 DIAGNOSIS — G4700 Insomnia, unspecified: Secondary | ICD-10-CM | POA: Diagnosis not present

## 2020-09-29 DIAGNOSIS — F430 Acute stress reaction: Secondary | ICD-10-CM | POA: Diagnosis not present

## 2020-09-29 DIAGNOSIS — F419 Anxiety disorder, unspecified: Secondary | ICD-10-CM | POA: Diagnosis not present

## 2020-10-28 DIAGNOSIS — F430 Acute stress reaction: Secondary | ICD-10-CM | POA: Diagnosis not present

## 2020-10-28 DIAGNOSIS — F320 Major depressive disorder, single episode, mild: Secondary | ICD-10-CM | POA: Diagnosis not present

## 2020-10-28 DIAGNOSIS — G4700 Insomnia, unspecified: Secondary | ICD-10-CM | POA: Diagnosis not present

## 2020-10-28 DIAGNOSIS — F419 Anxiety disorder, unspecified: Secondary | ICD-10-CM | POA: Diagnosis not present

## 2020-11-03 DIAGNOSIS — F4323 Adjustment disorder with mixed anxiety and depressed mood: Secondary | ICD-10-CM | POA: Diagnosis not present

## 2020-11-05 DIAGNOSIS — R059 Cough, unspecified: Secondary | ICD-10-CM | POA: Diagnosis not present

## 2020-11-05 DIAGNOSIS — R062 Wheezing: Secondary | ICD-10-CM | POA: Diagnosis not present

## 2020-11-05 DIAGNOSIS — U071 COVID-19: Secondary | ICD-10-CM | POA: Diagnosis not present

## 2020-11-05 DIAGNOSIS — F4323 Adjustment disorder with mixed anxiety and depressed mood: Secondary | ICD-10-CM | POA: Diagnosis not present

## 2020-11-19 DIAGNOSIS — F331 Major depressive disorder, recurrent, moderate: Secondary | ICD-10-CM | POA: Diagnosis not present

## 2020-12-09 DIAGNOSIS — F331 Major depressive disorder, recurrent, moderate: Secondary | ICD-10-CM | POA: Diagnosis not present

## 2020-12-22 DIAGNOSIS — F4323 Adjustment disorder with mixed anxiety and depressed mood: Secondary | ICD-10-CM | POA: Diagnosis not present

## 2021-01-18 DIAGNOSIS — E7800 Pure hypercholesterolemia, unspecified: Secondary | ICD-10-CM | POA: Diagnosis not present

## 2021-01-18 DIAGNOSIS — Z0000 Encounter for general adult medical examination without abnormal findings: Secondary | ICD-10-CM | POA: Diagnosis not present

## 2021-01-18 DIAGNOSIS — E118 Type 2 diabetes mellitus with unspecified complications: Secondary | ICD-10-CM | POA: Diagnosis not present

## 2021-01-18 DIAGNOSIS — Z113 Encounter for screening for infections with a predominantly sexual mode of transmission: Secondary | ICD-10-CM | POA: Diagnosis not present

## 2021-01-18 DIAGNOSIS — F411 Generalized anxiety disorder: Secondary | ICD-10-CM | POA: Diagnosis not present

## 2021-01-18 DIAGNOSIS — Z23 Encounter for immunization: Secondary | ICD-10-CM | POA: Diagnosis not present

## 2021-01-22 DIAGNOSIS — E118 Type 2 diabetes mellitus with unspecified complications: Secondary | ICD-10-CM | POA: Diagnosis not present

## 2021-01-22 DIAGNOSIS — Z0000 Encounter for general adult medical examination without abnormal findings: Secondary | ICD-10-CM | POA: Diagnosis not present

## 2021-01-22 DIAGNOSIS — Z1159 Encounter for screening for other viral diseases: Secondary | ICD-10-CM | POA: Diagnosis not present

## 2021-01-22 DIAGNOSIS — Z113 Encounter for screening for infections with a predominantly sexual mode of transmission: Secondary | ICD-10-CM | POA: Diagnosis not present

## 2021-06-16 DIAGNOSIS — S29012A Strain of muscle and tendon of back wall of thorax, initial encounter: Secondary | ICD-10-CM | POA: Diagnosis not present

## 2021-07-02 DIAGNOSIS — M545 Low back pain, unspecified: Secondary | ICD-10-CM | POA: Diagnosis not present

## 2021-07-20 DIAGNOSIS — M25551 Pain in right hip: Secondary | ICD-10-CM | POA: Diagnosis not present

## 2021-07-20 DIAGNOSIS — M5459 Other low back pain: Secondary | ICD-10-CM | POA: Diagnosis not present

## 2021-08-03 DIAGNOSIS — M545 Low back pain, unspecified: Secondary | ICD-10-CM | POA: Diagnosis not present

## 2021-10-19 DIAGNOSIS — M549 Dorsalgia, unspecified: Secondary | ICD-10-CM | POA: Diagnosis not present

## 2021-10-19 DIAGNOSIS — M79672 Pain in left foot: Secondary | ICD-10-CM | POA: Diagnosis not present

## 2021-10-19 DIAGNOSIS — R609 Edema, unspecified: Secondary | ICD-10-CM | POA: Diagnosis not present

## 2023-02-09 DIAGNOSIS — Z113 Encounter for screening for infections with a predominantly sexual mode of transmission: Secondary | ICD-10-CM | POA: Diagnosis not present

## 2023-02-09 DIAGNOSIS — B356 Tinea cruris: Secondary | ICD-10-CM | POA: Diagnosis not present

## 2023-02-09 DIAGNOSIS — R350 Frequency of micturition: Secondary | ICD-10-CM | POA: Diagnosis not present

## 2023-02-17 DIAGNOSIS — Z113 Encounter for screening for infections with a predominantly sexual mode of transmission: Secondary | ICD-10-CM | POA: Diagnosis not present

## 2023-02-17 DIAGNOSIS — J301 Allergic rhinitis due to pollen: Secondary | ICD-10-CM | POA: Diagnosis not present

## 2023-02-17 DIAGNOSIS — F411 Generalized anxiety disorder: Secondary | ICD-10-CM | POA: Diagnosis not present

## 2023-02-17 DIAGNOSIS — E7800 Pure hypercholesterolemia, unspecified: Secondary | ICD-10-CM | POA: Diagnosis not present

## 2023-02-17 DIAGNOSIS — F320 Major depressive disorder, single episode, mild: Secondary | ICD-10-CM | POA: Diagnosis not present

## 2023-02-17 DIAGNOSIS — E1169 Type 2 diabetes mellitus with other specified complication: Secondary | ICD-10-CM | POA: Diagnosis not present

## 2023-02-17 DIAGNOSIS — Z0000 Encounter for general adult medical examination without abnormal findings: Secondary | ICD-10-CM | POA: Diagnosis not present

## 2023-05-19 ENCOUNTER — Emergency Department (HOSPITAL_BASED_OUTPATIENT_CLINIC_OR_DEPARTMENT_OTHER): Payer: 59

## 2023-05-19 ENCOUNTER — Inpatient Hospital Stay (HOSPITAL_BASED_OUTPATIENT_CLINIC_OR_DEPARTMENT_OTHER)
Admission: EM | Admit: 2023-05-19 | Discharge: 2023-05-25 | DRG: 330 | Disposition: A | Discharge status: Home / Self Care | Payer: 59 | Attending: Obstetrics and Gynecology | Admitting: Obstetrics and Gynecology

## 2023-05-19 ENCOUNTER — Encounter (HOSPITAL_BASED_OUTPATIENT_CLINIC_OR_DEPARTMENT_OTHER): Payer: Self-pay | Admitting: Emergency Medicine

## 2023-05-19 ENCOUNTER — Other Ambulatory Visit: Payer: Self-pay

## 2023-05-19 DIAGNOSIS — S36533A Laceration of sigmoid colon, initial encounter: Secondary | ICD-10-CM | POA: Diagnosis present

## 2023-05-19 DIAGNOSIS — Z90710 Acquired absence of both cervix and uterus: Secondary | ICD-10-CM

## 2023-05-19 DIAGNOSIS — R102 Pelvic and perineal pain: Secondary | ICD-10-CM | POA: Diagnosis not present

## 2023-05-19 DIAGNOSIS — F17210 Nicotine dependence, cigarettes, uncomplicated: Secondary | ICD-10-CM | POA: Diagnosis present

## 2023-05-19 DIAGNOSIS — E119 Type 2 diabetes mellitus without complications: Secondary | ICD-10-CM | POA: Diagnosis present

## 2023-05-19 DIAGNOSIS — N838 Other noninflammatory disorders of ovary, fallopian tube and broad ligament: Secondary | ICD-10-CM

## 2023-05-19 DIAGNOSIS — Z794 Long term (current) use of insulin: Secondary | ICD-10-CM | POA: Diagnosis not present

## 2023-05-19 DIAGNOSIS — Z5331 Laparoscopic surgical procedure converted to open procedure: Secondary | ICD-10-CM | POA: Diagnosis not present

## 2023-05-19 DIAGNOSIS — K560 Paralytic ileus: Secondary | ICD-10-CM | POA: Diagnosis not present

## 2023-05-19 DIAGNOSIS — N83292 Other ovarian cyst, left side: Secondary | ICD-10-CM | POA: Diagnosis not present

## 2023-05-19 DIAGNOSIS — K660 Peritoneal adhesions (postprocedural) (postinfection): Secondary | ICD-10-CM | POA: Diagnosis present

## 2023-05-19 DIAGNOSIS — N83202 Unspecified ovarian cyst, left side: Secondary | ICD-10-CM | POA: Diagnosis not present

## 2023-05-19 DIAGNOSIS — R109 Unspecified abdominal pain: Principal | ICD-10-CM

## 2023-05-19 LAB — COMPREHENSIVE METABOLIC PANEL
ALT: 23 U/L (ref 0–44)
AST: 25 U/L (ref 15–41)
Albumin: 4.1 g/dL (ref 3.5–5.0)
Alkaline Phosphatase: 65 U/L (ref 38–126)
Anion gap: 10 (ref 5–15)
BUN: 15 mg/dL (ref 6–20)
CO2: 26 mmol/L (ref 22–32)
Calcium: 9.4 mg/dL (ref 8.9–10.3)
Chloride: 99 mmol/L (ref 98–111)
Creatinine, Ser: 0.87 mg/dL (ref 0.44–1.00)
GFR, Estimated: >60 mL/min (ref >60)
Glucose, Bld: 147 mg/dL — ABNORMAL HIGH (ref 70–99)
Potassium: 4.1 mmol/L (ref 3.5–5.1)
Sodium: 135 mmol/L (ref 135–145)
Total Bilirubin: 0.2 mg/dL — ABNORMAL LOW (ref 0.3–1.2)
Total Protein: 7.3 g/dL (ref 6.5–8.1)

## 2023-05-19 LAB — CBC WITH DIFFERENTIAL/PLATELET
Abs Immature Granulocytes: 0.06 10*3/uL (ref 0.00–0.07)
Basophils Absolute: 0.1 10*3/uL (ref 0.0–0.1)
Basophils Relative: 1 %
Eosinophils Absolute: 0.1 10*3/uL (ref 0.0–0.5)
Eosinophils Relative: 2 %
HCT: 41.3 % (ref 36.0–46.0)
Hemoglobin: 14 g/dL (ref 12.0–15.0)
Immature Granulocytes: 1 %
Lymphocytes Relative: 41 %
Lymphs Abs: 2.9 10*3/uL (ref 0.7–4.0)
MCH: 28.3 pg (ref 26.0–34.0)
MCHC: 33.9 g/dL (ref 30.0–36.0)
MCV: 83.4 fL (ref 80.0–100.0)
Monocytes Absolute: 0.5 10*3/uL (ref 0.1–1.0)
Monocytes Relative: 8 %
Neutro Abs: 3.4 10*3/uL (ref 1.7–7.7)
Neutrophils Relative %: 47 %
Platelets: 220 10*3/uL (ref 150–400)
RBC: 4.95 MIL/uL (ref 3.87–5.11)
RDW: 14.3 % (ref 11.5–15.5)
WBC: 7.1 10*3/uL (ref 4.0–10.5)
nRBC: 0 % (ref 0.0–0.2)

## 2023-05-19 LAB — URINALYSIS, ROUTINE W REFLEX MICROSCOPIC
Bilirubin Urine: NEGATIVE
Glucose, UA: 100 mg/dL — AB
Hgb urine dipstick: NEGATIVE
Ketones, ur: NEGATIVE mg/dL
Leukocytes,Ua: NEGATIVE
Nitrite: NEGATIVE
Protein, ur: NEGATIVE mg/dL
Specific Gravity, Urine: >=1.03 (ref 1.005–1.030)
pH: 7 (ref 5.0–8.0)

## 2023-05-19 LAB — I-STAT CHEM 8, ED
BUN: 14 mg/dL (ref 6–20)
Calcium, Ion: 1.23 mmol/L (ref 1.15–1.40)
Chloride: 99 mmol/L (ref 98–111)
Creatinine, Ser: 0.9 mg/dL (ref 0.44–1.00)
Glucose, Bld: 143 mg/dL — ABNORMAL HIGH (ref 70–99)
HCT: 46 % (ref 36.0–46.0)
Hemoglobin: 15.6 g/dL — ABNORMAL HIGH (ref 12.0–15.0)
Potassium: 4.1 mmol/L (ref 3.5–5.1)
Sodium: 136 mmol/L (ref 135–145)
TCO2: 27 mmol/L (ref 22–32)

## 2023-05-19 LAB — HCG, QUANTITATIVE, PREGNANCY: hCG, Beta Chain, Quant, S: 1 m[IU]/mL (ref <5)

## 2023-05-19 LAB — LIPASE, BLOOD: Lipase: 34 U/L (ref 11–51)

## 2023-05-19 MED ORDER — ACETAMINOPHEN 500 MG PO TABS
1000.0000 mg | ORAL_TABLET | Freq: Once | ORAL | Status: AC
  Administered 2023-05-19: 1000 mg via ORAL

## 2023-05-19 MED ORDER — SODIUM CHLORIDE 0.9 % IV BOLUS
1000.0000 mL | Freq: Once | INTRAVENOUS | Status: AC
  Administered 2023-05-19: 1000 mL via INTRAVENOUS

## 2023-05-19 MED ORDER — ONDANSETRON HCL 4 MG/2ML IJ SOLN
4.0000 mg | Freq: Once | INTRAMUSCULAR | Status: AC
  Administered 2023-05-19: 4 mg via INTRAVENOUS

## 2023-05-19 MED ORDER — ATORVASTATIN CALCIUM 10 MG PO TABS
10.0000 mg | ORAL_TABLET | Freq: Every day | ORAL | Status: DC
  Administered 2023-05-19 – 2023-05-24 (×6): 10 mg via ORAL
  Filled 2023-05-19 (×7): qty 1

## 2023-05-19 MED ORDER — IOHEXOL 350 MG/ML SOLN
100.0000 mL | Freq: Once | INTRAVENOUS | Status: AC | PRN
  Administered 2023-05-19: 100 mL via INTRAVENOUS

## 2023-05-19 MED ORDER — ESCITALOPRAM OXALATE 10 MG PO TABS
20.0000 mg | ORAL_TABLET | Freq: Every day | ORAL | Status: DC
  Administered 2023-05-19 – 2023-05-24 (×6): 20 mg via ORAL
  Filled 2023-05-19 (×2): qty 2
  Filled 2023-05-19: qty 1
  Filled 2023-05-19 (×5): qty 2

## 2023-05-19 MED ORDER — MORPHINE SULFATE (PF) 4 MG/ML IV SOLN
4.0000 mg | Freq: Once | INTRAVENOUS | Status: AC
  Administered 2023-05-19: 4 mg via INTRAVENOUS

## 2023-05-19 MED ORDER — OXYCODONE HCL 5 MG PO TABS
10.0000 mg | ORAL_TABLET | Freq: Four times a day (QID) | ORAL | Status: DC | PRN
  Administered 2023-05-19: 10 mg via ORAL
  Filled 2023-05-19: qty 2

## 2023-05-19 MED ORDER — HYDROXYZINE HCL 50 MG PO TABS
25.0000 mg | ORAL_TABLET | Freq: Three times a day (TID) | ORAL | Status: DC | PRN
  Administered 2023-05-20 – 2023-05-21 (×2): 25 mg via ORAL
  Filled 2023-05-19 (×4): qty 1

## 2023-05-19 MED ORDER — KETOROLAC TROMETHAMINE 15 MG/ML IJ SOLN
15.0000 mg | Freq: Once | INTRAMUSCULAR | Status: AC
  Administered 2023-05-19: 15 mg via INTRAVENOUS

## 2023-05-19 MED ORDER — ALBUTEROL SULFATE (2.5 MG/3ML) 0.083% IN NEBU: 2.5000 mg | INHALATION_SOLUTION | Freq: Four times a day (QID) | RESPIRATORY_TRACT | Status: DC | PRN

## 2023-05-19 MED ORDER — LACTATED RINGERS IV SOLN: INTRAVENOUS | Status: DC

## 2023-05-19 MED ORDER — PRENATAL MULTIVITAMIN CH: 1.0000 | ORAL_TABLET | Freq: Every day | ORAL | Status: DC

## 2023-05-19 MED ORDER — HYDROMORPHONE HCL 1 MG/ML IJ SOLN
1.0000 mg | Freq: Once | INTRAMUSCULAR | Status: AC
  Administered 2023-05-19: 1 mg via INTRAVENOUS

## 2023-05-19 MED ORDER — FLUTICASONE PROPIONATE 50 MCG/ACT NA SUSP
1.0000 | Freq: Every day | NASAL | Status: DC | PRN
  Filled 2023-05-19: qty 16

## 2023-05-19 NOTE — ED Notes (Signed)
Report called to [facility], talked with charge RN, MD Donavan Foil accepting,

## 2023-05-19 NOTE — ED Notes (Signed)
Pt reports a decrease in pain, offered non pharmacological treatments, pt accepts heating pad.

## 2023-05-19 NOTE — ED Provider Notes (Signed)
EMERGENCY DEPARTMENT AT [facility] Provider Note  CSN: [id number] Arrival date & time: 05/19/23 0300  Chief Complaint(s) Flank Pain  HPI Maria [name] is a 42 y.o. female with past medical history as below, significant for DM, recurrent kidney stones, fibroids, migraines who presents to the ED with complaint of left flank/llq pain  Onset yesterday evening, left flank with radiation to LLQ and suprapubic. Pain has been constant, progressively worsening, unable to find position of comfort, nausea w/o emesis, no fevers or chills. Pain sharp / stabbing, feels similar to prior kidney stone. Hx prior lithotripsy, was seen prior by [facility]. No abnormal vaginal bleeding or discharge, no sig dysuria.   Past Medical History Past Medical History:  Diagnosis Date   Acid reflux    Chronic kidney disease    kidney stones    Diabetes mellitus without complication (HCC)    Dysmenorrhea    Fibroids    Headache    Kidney stone    Patient Active Problem List   Diagnosis Date Noted   Chronic migraine 08/21/2018   Memory loss 08/21/2018   Uterine leiomyoma 05/27/2015   Home Medication(s) Prior to Admission medications   Medication Sig Start Date End Date Taking? Authorizing Provider  albuterol (PROVENTIL HFA;VENTOLIN HFA) 108 (90 BASE) MCG/ACT inhaler Inhale 1-2 puffs into the lungs every 6 (six) hours as needed for wheezing or shortness of breath. 10/10/15   Lorre Nick, MD  aspirin 325 MG tablet Take 325 mg by mouth daily.    [provider]  butalbital-acetaminophen-caffeine (FIORICET WITH CODEINE) 50-325-40-30 MG capsule Take 1 capsule by mouth as needed for headache.    [provider]  diazepam (VALIUM) 5 MG tablet Take 10 mg by mouth at bedtime as needed for anxiety.    [provider]  escitalopram (LEXAPRO) 20 MG tablet Take 20 mg by mouth daily.    [provider]  ibuprofen (ADVIL,MOTRIN) 200 MG tablet Take 200 mg  by mouth every 6 (six) hours as needed for moderate pain.    [provider]  metFORMIN (GLUCOPHAGE-XR) 500 MG 24 hr tablet Take 500 mg by mouth 2 (two) times daily.    [provider]  nortriptyline (PAMELOR) 25 MG capsule Take 2 capsules (50 mg total) by mouth at bedtime. 08/21/18   Levert Feinstein, MD  ondansetron (ZOFRAN ODT) 4 MG disintegrating tablet Take 1 tablet (4 mg total) by mouth every 8 (eight) hours as needed. 08/21/18   Levert Feinstein, MD  pantoprazole (PROTONIX) 20 MG tablet Take 1 tablet (20 mg total) by mouth daily. 05/31/18   Mesner, Barbara Cower, MD  simvastatin (ZOCOR) 10 MG tablet Take 10 mg by mouth daily.    [provider]  SUMAtriptan (IMITREX) 100 MG tablet Take 1 tablet (100 mg total) by mouth once as needed for up to 1 dose for migraine. May repeat in 2 hours if headache persists or recurs. No refill in less than 30 days 08/21/18   Levert Feinstein, MD  Past Surgical History Past Surgical History:  Procedure Laterality Date   ABDOMINAL HYSTERECTOMY N/A 05/27/2015   Procedure: HYSTERECTOMY ABDOMINAL;  Surgeon: Myna Hidalgo, DO;  Location: [facility];  Service: Gynecology;  Laterality: N/A;  669.1g    BILATERAL SALPINGECTOMY Bilateral 05/27/2015   Procedure: BILATERAL SALPINGECTOMY;  Surgeon: Myna Hidalgo, DO;  Location: [facility];  Service: Gynecology;  Laterality: Bilateral;   CESAREAN SECTION     LITHOTRIPSY     Family History Family History  Problem Relation Age of Onset   Diabetes Mother    Hypertension Mother    Heart attack Mother    Healthy Father     Social History Social History   Tobacco Use   Smoking status: Every Day    Current packs/day: 0.50    Types: Cigarettes   Smokeless tobacco: Never  Vaping Use   Vaping status: Never Used  Substance Use Topics   Alcohol use: Yes    Comment: social    Drug use: No    Allergies Patient has no known allergies.  Review of Systems Review of Systems  Constitutional:  Negative for chills and fever.  Respiratory:  Negative for chest tightness and shortness of breath.   Cardiovascular:  Negative for chest pain and palpitations.  Gastrointestinal:  Positive for abdominal pain and nausea. Negative for vomiting.  Genitourinary:  Positive for flank pain. Negative for dysuria.  Musculoskeletal:  Negative for gait problem.  Neurological:  Negative for weakness and numbness.  All other systems reviewed and are negative.   Physical Exam Vital Signs  I have reviewed the triage vital signs BP (!) 152/94   Pulse 91   Temp 98.1 F (36.7 C) (Oral)   Resp 18   Ht 4\' 9"  (1.448 m)   Wt 72.6 kg   LMP 05/12/2015 (Exact Date)   SpO2 94%   BMI 34.62 kg/m  Physical Exam Vitals and nursing note reviewed.  Constitutional:      General: She is not in acute distress.    Appearance: Normal appearance.  HENT:     Head: Normocephalic and atraumatic.     Right Ear: External ear normal.     Left Ear: External ear normal.     Nose: Nose normal.     Mouth/Throat:     Mouth: Mucous membranes are moist.  Eyes:     General: No scleral icterus.       Right eye: No discharge.        Left eye: No discharge.  Cardiovascular:     Rate and Rhythm: Normal rate and regular rhythm.     Pulses: Normal pulses.     Heart sounds: Normal heart sounds.  Pulmonary:     Effort: Pulmonary effort is normal. No respiratory distress.     Breath sounds: Normal breath sounds. No stridor.  Abdominal:     General: Abdomen is flat. There is no distension.     Palpations: Abdomen is soft.     Tenderness: There is abdominal tenderness.       Comments: Non peritoneal  Musculoskeletal:     Cervical back: No rigidity.     Right lower leg: No edema.     Left lower leg: No edema.  Skin:    General: Skin is warm and dry.     Capillary Refill: Capillary refill takes less than 2 seconds.   Neurological:     Mental Status: She is alert.  Psychiatric:        Mood and Affect:  Mood normal.        Behavior: Behavior normal. Behavior is cooperative.     ED Results and Treatments Labs (all labs ordered are listed, but only abnormal results are displayed) Labs Reviewed  COMPREHENSIVE METABOLIC PANEL - Abnormal; Notable for the following components:      Result Value   Glucose, Bld 147 (*)    Total Bilirubin 0.2 (*)    All other components within normal limits  URINALYSIS, ROUTINE W REFLEX MICROSCOPIC - Abnormal; Notable for the following components:   APPearance CLOUDY (*)    Glucose, UA 100 (*)    All other components within normal limits  I-STAT CHEM 8, ED - Abnormal; Notable for the following components:   Glucose, Bld 143 (*)    Hemoglobin 15.6 (*)    All other components within normal limits  CBC WITH DIFFERENTIAL/PLATELET  LIPASE, BLOOD                                                                                                                          Radiology No results found.  Pertinent labs & imaging results that were available during my care of the patient were reviewed by me and considered in my medical decision making (see MDM for details).  Medications Ordered in ED Medications  sodium chloride 0.9 % bolus 1,000 mL (1,000 mLs Intravenous New Bag/Given 05/19/23 0537)  ketorolac (TORADOL) 15 MG/ML injection 15 mg (15 mg Intravenous Given 05/19/23 0538)  ondansetron (ZOFRAN) injection 4 mg (4 mg Intravenous Given 05/19/23 0538)  HYDROmorphone (DILAUDID) injection 1 mg (1 mg Intravenous Given 05/19/23 0618)                                                                                                                                     Procedures Procedures  (including critical care time)  Medical Decision Making / ED Course    Medical Decision Making:    Maria [name] is a 42 y.o. female with past medical history as below, significant for  DM, recurrent kidney stones, fibroids, migraines who presents to the ED with complaint of left flank/llq pain. The complaint involves an extensive differential diagnosis and also carries with it a high risk of complications and morbidity.  Serious etiology was considered. Ddx includes but is not limited to: Differential diagnosis includes but is not exclusive to ectopic pregnancy, ovarian cyst, ovarian torsion, acute  appendicitis, urinary tract infection, endometriosis, bowel obstruction, hernia, colitis, renal colic, gastroenteritis, volvulus etc.   Complete initial physical exam performed, notably the patient  was pacing around the room, abd not peritoneal, HDS.    Reviewed and confirmed nursing documentation for past medical history, family history, social history.  Vital signs reviewed.      Patient arrived left-sided flank pain left lower quadrant and suprapubic pain. Feels similar to prior kidney stone. Abd not peritoneal, afebrile. Will collect screening labs and obtain imaging Minimal improvement with toradol, will trial dilaudid, given zofran and IVF as well Labs stable, Cr 0.87, UA w/o evidence of infection. WBC 7.1 History is highly suspicious for nephrolithiasis, fortunately labs stable without evidence of septic stone or renal insufficiency Technical failure with imaging; studies are unable to be interpreted by radiology or viewed in PACS at this time Pt signed out to incoming EDP at shift change pending imaging Her pain is much better with analgesia, no vomiting           Additional history obtained: -Additional history obtained from family -External records from outside source obtained and reviewed including: Chart review including previous notes, labs, imaging, consultation notes including PDMP, prior ed visit, prior labs/imaging    Lab Tests: -I ordered, reviewed, and interpreted labs.   The pertinent results include:   Labs Reviewed  COMPREHENSIVE METABOLIC PANEL -  Abnormal; Notable for the following components:      Result Value   Glucose, Bld 147 (*)    Total Bilirubin 0.2 (*)    All other components within normal limits  URINALYSIS, ROUTINE W REFLEX MICROSCOPIC - Abnormal; Notable for the following components:   APPearance CLOUDY (*)    Glucose, UA 100 (*)    All other components within normal limits  I-STAT CHEM 8, ED - Abnormal; Notable for the following components:   Glucose, Bld 143 (*)    Hemoglobin 15.6 (*)    All other components within normal limits  CBC WITH DIFFERENTIAL/PLATELET  LIPASE, BLOOD    Notable for labs stable as above  EKG   EKG Interpretation Date/Time:    Ventricular Rate:    PR Interval:    QRS Duration:    QT Interval:    QTC Calculation:   R Axis:      Text Interpretation:           Imaging Studies ordered: I ordered imaging studies including CT renal I independently visualized the following imaging with scope of interpretation limited to determining acute life threatening conditions related to emergency care; findings noted above, significant for pending imaging I independently visualized and interpreted imaging. I agree with the radiologist interpretation   Medicines ordered and prescription drug management: Meds ordered this encounter  Medications   sodium chloride 0.9 % bolus 1,000 mL   ketorolac (TORADOL) 15 MG/ML injection 15 mg   ondansetron (ZOFRAN) injection 4 mg   HYDROmorphone (DILAUDID) injection 1 mg    -I have reviewed the patients home medicines and have made adjustments as needed   Consultations Obtained: na   Cardiac Monitoring: Continuous pulse oximetry interpreted by myself 99-100% on RA  Social Determinants of Health:  Diagnosis or treatment significantly limited by social determinants of health: current smoker Counseled patient for approximately 2 minutes regarding smoking cessation. Discussed risks of smoking and how they applied and affected their visit here today.  Patient not ready to quit at this time, however will follow up with their primary doctor when they are.  CPT code: 09604: intermediate counseling for smoking cessation     Reevaluation: After the interventions noted above, I reevaluated the patient and found that they have improved  Co morbidities that complicate the patient evaluation  Past Medical History:  Diagnosis Date   Acid reflux    Chronic kidney disease    kidney stones    Diabetes mellitus without complication (HCC)    Dysmenorrhea    Fibroids    Headache    Kidney stone       Dispostion: Disposition decision including need for hospitalization was considered, and patient disposition pending at time of sign out.    Final Clinical Impression(s) / ED Diagnoses Final diagnoses:  Left flank pain     This chart was dictated using voice recognition software.  Despite best efforts to proofread,  errors can occur which can change the documentation meaning.    Sloan Leiter, DO 05/19/23 0745

## 2023-05-19 NOTE — MAU Note (Signed)
Transfer from [facility] with left back/flank pain for several days.  Rates pain 7/10. Was given pain medication before she was transferred by Private car BP 114/69   Pulse 70   Temp (!) 97.5 F (36.4 C)   Resp 18   Ht 4\' 9"  (1.448 m)   Wt 72.6 kg   LMP 05/12/2015 (Exact Date)   SpO2 98%   BMI 34.62 kg/m

## 2023-05-19 NOTE — ED Provider Notes (Signed)
2:33 PM Assumed care of patient from off-going team. For more details, please see note from same day.  In brief, this is a 42 y.o. female p/w L flank pain feels like prior renal stones. No f/c. H/o prior lithotripsy for recurrent renal stones. UA w/o infection, Cr wnl  Plan/Dispo at time of sign-out & ED Course since sign-out: [ ]  CT imaging which had been delayed d/t technical failure  BP (!) 135/104   Pulse 70   Temp 97.9 F (36.6 C)   Resp 18   Ht 4\' 9"  (1.448 m)   Wt 72.6 kg   LMP 05/12/2015 (Exact Date)   SpO2 98%   BMI 34.62 kg/m    ED Course:   Clinical Course as of 05/19/23 1433  Fri May 19, 2023  0930 Per radiology call, she has large right ovary and radiology recommends [place]. 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 [HN]  1107 Per radiology, L ovary with no central flow. Concerning for torsion. Per prior imaging, had endometrioma/hemorrhagic cysts, possibility that this could represent an endometrioma, but with acute onset pain this is concerning for torsion. Will emergently c/w gyn.  [HN]  1206 D/w Dr. Donavan Foil with OB/gyn who insists on a repeat ultrasound for this patient to verify torsion. He states that her pain could be due to a cyst or endometrioma. She has low flow and edema of ovary on first ultrasound.  [HN]  1314 D/w radiologist who states that [place] is possibly worse than before. No central flow. Still some peripheral flow. Echogenic ovary with some internal hemorrhage. Also states that on CTA imaging, ovary does not enhance, no change in attenuation of that ovary. Still plausible that entire ovary is one hemorrhagic cyst, that she might only have thin stroma around edge, but cannot r/o torsion.  [HN]  1318 Patient had seen PA Horton Marshall at [facility] for a pap smear within the last year. Will page Dr. Connye Burkitt who is on schedule call for [facility] today. [HN]  1323 Patient hasn't been seen since 2017 at  [place]. She is an unassigned patient. Will repage to Dr. Donavan Foil.  [HN]  1410 Spoke with Dr. Donavan Foil who wants patient transferred to MAU for further evaluation.  [HN]  1431 D/w patient. She requests pain and nausea medicine prior to transfer. She will go to MAU POV, driven by family member with IV in place. Instructed to drive directly to MAU. Patient reports understanding. [HN]    Clinical Course User Index [HN] Loetta Rough, MD   ------------------------------- Vivi Barrack, MD Emergency Medicine  This note was created using dictation software, which may contain spelling or grammatical errors.   Loetta Rough, MD 05/19/23 [phone]

## 2023-05-19 NOTE — ED Notes (Signed)
Pt back from ct and ultrasound, pt reports increased pain, states that her pain is an 8/10, requests pain med,  md notified, pain med given.

## 2023-05-19 NOTE — ED Notes (Signed)
Pt in bed, pt states that her pain is a 4/10 and she doesn't need anything at this time.

## 2023-05-19 NOTE — ED Notes (Signed)
Pt sitting in chair, pt states that she continues to have abd pain and pressure.  Md notified, pain med given.

## 2023-05-19 NOTE — ED Notes (Addendum)
Pt in bed, family at bedside, pt verbalized that she is being transferred to the MAU via pov.  Pt states that she has no needs before transport, family also verbalized understanding.  Pt from department with family.  Pt has IV in place.

## 2023-05-19 NOTE — ED Triage Notes (Signed)
  Patient comes in with L sided flank pain that started yesterday around 1800.  Patient states she has hx of kidney stones.  Took 600 mg ibuprofen at 1800, and 650 mg tylenol at 2200 with minimal relief.  Denies dysuria.  Pain 10/10, sharp/stabbing.

## 2023-05-19 NOTE — H&P (Signed)
[facility] Obstetrics and Gynecology Attending History and Physical  Kirk KIPPY MELENA is a 42 y.o. female sent who presented to [facility] today for evaluation of possible torsed ovary sent from [facility] ER.  Pt noted gradually worsening pain in the LLQ since Monday. The pain was intermittent and a deep gnawing pain.  Some mild to moderate nausea was noted as well.  Pt denies fever/chills.  Pt had abdominal hysterectomy with bilateral salpingectomy in 2016 for fibroids.  No major issues since then.  Serial ultrasounds and [place] were suggestive, but not conclusive for left ovarian torsion. Denies any abnormal vaginal discharge, fevers, chills, sweats, dysuria, nausea, vomiting, other GI or GU symptoms or other general symptoms.  Past Medical History:  Diagnosis Date   Acid reflux    Chronic kidney disease    kidney stones    Diabetes mellitus without complication (HCC)    Dysmenorrhea    Fibroids    Headache    Kidney stone    Past Surgical History:  Procedure Laterality Date   ABDOMINAL HYSTERECTOMY N/A 05/27/2015   Procedure: HYSTERECTOMY ABDOMINAL;  Surgeon: Myna Hidalgo, DO;  Location: [facility];  Service: Gynecology;  Laterality: N/A;  669.1g    BILATERAL SALPINGECTOMY Bilateral 05/27/2015   Procedure: BILATERAL SALPINGECTOMY;  Surgeon: Myna Hidalgo, DO;  Location: [facility];  Service: Gynecology;  Laterality: Bilateral;   CESAREAN SECTION     LITHOTRIPSY     OB History  No obstetric history on file.  Patient denies any other pertinent gynecologic issues.  No current facility-administered medications on file prior to encounter.   Current Outpatient Medications on File Prior to Encounter  Medication Sig Dispense Refill   albuterol (PROVENTIL HFA;VENTOLIN HFA) 108 (90 BASE) MCG/ACT inhaler Inhale 1-2 puffs into the lungs every 6 (six) hours as needed for wheezing or shortness of breath. 1 Inhaler 0   atorvastatin (LIPITOR) 10 MG tablet Take 10 mg by mouth daily.      escitalopram (LEXAPRO) 20 MG tablet Take 20 mg by mouth daily.     fluticasone (FLONASE) 50 MCG/ACT nasal spray Place 1 spray into both nostrils as needed for allergies.     hydrOXYzine (ATARAX) 25 MG tablet Take 25 mg by mouth every 8 (eight) hours as needed (sleep).     TRULICITY 1.5 MG/0.5ML SOPN Inject 1.5 mg into the skin once a week. Sundays     nortriptyline (PAMELOR) 25 MG capsule Take 2 capsules (50 mg total) by mouth at bedtime. (Patient not taking: Reported on 05/19/2023) 60 capsule 11   ondansetron (ZOFRAN ODT) 4 MG disintegrating tablet Take 1 tablet (4 mg total) by mouth every 8 (eight) hours as needed. (Patient not taking: Reported on 05/19/2023) 20 tablet 6   pantoprazole (PROTONIX) 20 MG tablet Take 1 tablet (20 mg total) by mouth daily. (Patient not taking: Reported on 05/19/2023) 15 tablet 0   SUMAtriptan (IMITREX) 100 MG tablet Take 1 tablet (100 mg total) by mouth once as needed for up to 1 dose for migraine. May repeat in 2 hours if headache persists or recurs. No refill in less than 30 days (Patient not taking: Reported on 05/19/2023) 12 tablet 6   No Known Allergies  Social History:   reports that she has been smoking cigarettes. She has never used smokeless tobacco. She reports current alcohol use. She reports that she does not use drugs. Family History  Problem Relation Age of Onset   Diabetes Mother    Hypertension Mother  Heart attack Mother    Healthy Father     Review of Systems: Pertinent items noted in HPI and remainder of comprehensive ROS otherwise negative.  PHYSICAL EXAM: Blood pressure 114/69, pulse 70, temperature (!) 97.5 F (36.4 C), resp. rate 18, height 4\' 9"  (1.448 m), weight 72.6 kg, last menstrual period 05/12/2015, SpO2 98%. CONSTITUTIONAL: Well-developed, well-nourished female in no acute distress.  HENT:  Normocephalic, atraumatic,  EYES: Conjunctivae and EOM are normal.  NECK: Normal range of motion, supple, no masses SKIN: Skin is warm and  dry. No rash noted. Not diaphoretic. No erythema. No pallor. NEUROLOGIC: Alert and oriented to person, place, and time. Normal reflexes, muscle tone coordination. No cranial nerve deficit noted. PSYCHIATRIC: Normal mood and affect. Normal behavior. Normal judgment and thought content. CARDIOVASCULAR: Normal heart rate noted, regular rhythm RESPIRATORY: Effort and breath sounds normal, no problems with respiration noted ABDOMEN: Soft, obese, very mild tenderness in LLQ, nondistended.  Not a surgical abdomen, no rebound or guarding. PELVIC: deferred MUSCULOSKELETAL: Normal range of motion. No tenderness.  No cyanosis, clubbing, or edema.  2+ distal pulses.  Labs: Results for orders placed or performed during the hospital encounter of 05/19/23 (from the past 336 hour(s))  I-stat chem 8, ED (not at [facility], [facility] or [facility])   Collection Time: 05/19/23  4:09 AM  Result Value Ref Range   Sodium 136 135 - 145 mmol/L   Potassium 4.1 3.5 - 5.1 mmol/L   Chloride 99 98 - 111 mmol/L   BUN 14 6 - 20 mg/dL   Creatinine, Ser 1.61 0.44 - 1.00 mg/dL   Glucose, Bld 096 (H) 70 - 99 mg/dL   Calcium, Ion 0.45 4.09 - 1.40 mmol/L   TCO2 27 22 - 32 mmol/L   Hemoglobin 15.6 (H) 12.0 - 15.0 g/dL   HCT 81.1 91.4 - 78.2 %  CBC with Differential   Collection Time: 05/19/23  4:20 AM  Result Value Ref Range   WBC 7.1 4.0 - 10.5 K/uL   RBC 4.95 3.87 - 5.11 MIL/uL   Hemoglobin 14.0 12.0 - 15.0 g/dL   HCT 95.6 21.3 - 08.6 %   MCV 83.4 80.0 - 100.0 fL   MCH 28.3 26.0 - 34.0 pg   MCHC 33.9 30.0 - 36.0 g/dL   RDW 57.8 46.9 - 62.9 %   Platelets 220 150 - 400 K/uL   nRBC 0.0 0.0 - 0.2 %   Neutrophils Relative % 47 %   Neutro Abs 3.4 1.7 - 7.7 K/uL   Lymphocytes Relative 41 %   Lymphs Abs 2.9 0.7 - 4.0 K/uL   Monocytes Relative 8 %   Monocytes Absolute 0.5 0.1 - 1.0 K/uL   Eosinophils Relative 2 %   Eosinophils Absolute 0.1 0.0 - 0.5 K/uL   Basophils Relative 1 %   Basophils Absolute 0.1 0.0 - 0.1 K/uL   Immature  Granulocytes 1 %   Abs Immature Granulocytes 0.06 0.00 - 0.07 K/uL  Comprehensive metabolic panel   Collection Time: 05/19/23  4:20 AM  Result Value Ref Range   Sodium 135 135 - 145 mmol/L   Potassium 4.1 3.5 - 5.1 mmol/L   Chloride 99 98 - 111 mmol/L   CO2 26 22 - 32 mmol/L   Glucose, Bld 147 (H) 70 - 99 mg/dL   BUN 15 6 - 20 mg/dL   Creatinine, Ser 5.28 0.44 - 1.00 mg/dL   Calcium 9.4 8.9 - 41.3 mg/dL   Total Protein 7.3 6.5 - 8.1 g/dL  Albumin 4.1 3.5 - 5.0 g/dL   AST 25 15 - 41 U/L   ALT 23 0 - 44 U/L   Alkaline Phosphatase 65 38 - 126 U/L   Total Bilirubin 0.2 (L) 0.3 - 1.2 mg/dL   GFR, Estimated >16 >10 mL/min   Anion gap 10 5 - 15  Lipase, blood   Collection Time: 05/19/23  4:20 AM  Result Value Ref Range   Lipase 34 11 - 51 U/L  Urinalysis, Routine w reflex microscopic -Urine, Clean Catch   Collection Time: 05/19/23  4:20 AM  Result Value Ref Range   Color, Urine YELLOW YELLOW   APPearance CLOUDY (A) CLEAR   Specific Gravity, Urine >=1.030 1.005 - 1.030   pH 7.0 5.0 - 8.0   Glucose, UA 100 (A) NEGATIVE mg/dL   Hgb urine dipstick NEGATIVE NEGATIVE   Bilirubin Urine NEGATIVE NEGATIVE   Ketones, ur NEGATIVE NEGATIVE mg/dL   Protein, ur NEGATIVE NEGATIVE mg/dL   Nitrite NEGATIVE NEGATIVE   Leukocytes,Ua NEGATIVE NEGATIVE  hCG, quantitative, pregnancy   Collection Time: 05/19/23  4:20 AM  Result Value Ref Range   hCG, Beta Chain, Quant, S 1 <5 mIU/mL    Imaging Studies: CT Angio Abd/Pel W and/or Wo Contrast  Result Date: 05/19/2023 CLINICAL DATA:  42 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 EXAM: CT ANGIOGRAPHY ABDOMEN AND PELVIS WITH CONTRAST AND WITHOUT CONTRAST TECHNIQUE: Multidetector CT imaging of the abdomen and pelvis was performed using the standard protocol during bolus administration of intravenous contrast. Multiplanar reconstructed images and MIPs were obtained and reviewed to evaluate the vascular anatomy. RADIATION DOSE  REDUCTION: This exam was performed according to the departmental dose-optimization program which includes automated exposure control, adjustment of the mA and/or kV according to patient size and/or use of iterative reconstruction technique. CONTRAST:  OMNIPAQUE IOHEXOL 350 MG/ML SOLN COMPARISON:  Noncontrast CT abdomen pelvis from earlier the same day. FINDINGS: VASCULAR Aorta: Normal caliber aorta without aneurysm, dissection, vasculitis or significant stenosis. Celiac: The celiac is patent. Arising from a proximal branch of the left gastric artery, just medial to the lesser curvature of the stomach is a saccular, peripherally calcified and at least partially patent aneurysm measuring 1.1 x 0.9 x 0.6 cm (AP by trans by cc). SMA: Patent without evidence of aneurysm, dissection, vasculitis or significant stenosis. Renals: Single bilateral renal arteries are patent without evidence of aneurysm, dissection, vasculitis, fibromuscular dysplasia or significant stenosis. IMA: Patent without evidence of aneurysm, dissection, vasculitis or significant stenosis. Inflow: Patent without evidence of aneurysm, dissection, vasculitis or significant stenosis. Proximal Outflow: Bilateral common femoral and visualized portions of the superficial and profunda femoral arteries are patent without evidence of aneurysm, dissection, vasculitis or significant stenosis. Veins: No obvious venous abnormality within the limitations of this arterial phase study. Review of the MIP images confirms the above findings. NON-VASCULAR Lower chest: No acute abnormality. Hepatobiliary: No focal liver abnormality is seen. No gallstones, gallbladder wall thickening, or biliary dilatation. Pancreas: Unremarkable. No pancreatic ductal dilatation or surrounding inflammatory changes. Spleen: No splenic injury or perisplenic hematoma. Adrenals/Urinary Tract: Adrenal glands are unremarkable. Kidneys are normal, without renal calculi, focal lesion, or  hydronephrosis. Bladder is unremarkable. Stomach/Bowel: Stomach is within normal limits. Appendix appears normal. No evidence of bowel wall thickening, distention, or inflammatory changes. Lymphatic: No abdominopelvic lymphadenopathy. Reproductive: Again seen is a large left adnexal hypoattenuating mass measuring up to approximately 5.2 cm which contains a fluid-fluid level. No significant surrounding inflammatory changes. The uterus is  surgically absent. Other: No abdominal wall hernia or abnormality. No abdominopelvic ascites. Musculoskeletal: No acute or significant osseous findings. 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 NON-VASCULAR Again seen is a large left adnexal cyst measuring up to approximately 5.2 cm which demonstrates a fluid-fluid level on this study, introducing differential diagnosis of hemorrhagic ovarian cyst. Marliss Coots, MD [facility] Radiology Electronically Signed   By: Marliss Coots M.D.   On: 05/19/2023 14:05   US PELVIC COMPLETE W TRANSVAGINAL AND TORSION R/O  Result Date: 05/19/2023 CLINICAL DATA:  Follow-up of prior ultrasound examination with concern for left ovarian torsion EXAM: TRANSABDOMINAL AND TRANSVAGINAL ULTRASOUND OF PELVIS DOPPLER ULTRASOUND OF OVARIES TECHNIQUE: Both transabdominal and transvaginal ultrasound examinations of the pelvis were performed. Transabdominal technique was performed for global imaging of the pelvis including uterus, ovaries, adnexal regions, and pelvic cul-de-sac. It was necessary to proceed with endovaginal exam following the transabdominal exam to visualize the ovaries. Color and duplex Doppler ultrasound was utilized to evaluate blood flow to the ovaries. COMPARISON:  Ultrasound pelvis dated 05/19/2023, same day CTA abdomen and pelvis dated 05/19/2023 FINDINGS: Uterus  Surgically absent Endometrium Surgically absent Right ovary Measurements: 3.6 x 3.0 x 2.3 cm = volume: 12.6 mL. Similar mildly heterogeneous appearance. Left ovary Measurements: 5.4 x 4.6 x 4.2 cm = volume: 54.6 mL. Persistent heterogeneous echogenicity. Pulsed Doppler evaluation of both ovaries again demonstrates absent central arterial flow within the left ovary with a single focus of questionable venous waveforms. Peripheral waveforms are again seen. Right ovary demonstrates preserved internal arterial and venous waveforms. Other findings No abnormal free fluid. IMPRESSION: 1. Persistent findings of asymmetrically enlarged, diffusely echogenic left ovary with absence of central vascularity. While it is presumable that the left ovarian stroma is effaced and displaced peripherally due to a large hemorrhagic cyst/endometrioma, findings again raising suspicion for ovarian torsion. 2. Similar mildly heterogeneous appearance of the right ovary. Critical Value/emergent results were called by telephone at the time of interpretation on 05/19/2023 at 1:22 pm to provider HAYLEY NAASZ , who verbally acknowledged these results. Electronically Signed   By: Agustin Cree M.D.   On: 05/19/2023 13:23   US PELVIC COMPLETE W TRANSVAGINAL AND TORSION R/O  Result Date: 05/19/2023 CLINICAL DATA:  Acute onset left flank/left pelvic pain. EXAM: TRANSABDOMINAL AND TRANSVAGINAL ULTRASOUND OF PELVIS DOPPLER ULTRASOUND OF OVARIES TECHNIQUE: Both transabdominal and transvaginal ultrasound examinations of the pelvis were performed. Transabdominal technique was performed for global imaging of the pelvis including uterus, ovaries, adnexal regions, and pelvic cul-de-sac. It was necessary to proceed with endovaginal exam following the transabdominal exam to visualize the ovaries. Color and duplex Doppler ultrasound was utilized to evaluate blood flow to the ovaries. COMPARISON:  CT abdomen and pelvis dated 05/19/2023, MR pelvis dated 05/16/2016  FINDINGS: Uterus Surgically absent Endometrium Surgically absent Right ovary Measurements: 3.7 x 3.1 x 2.3 cm = volume: 13.7 mL. Mildly heterogeneous in appearance. No adnexal mass. Left ovary Measurements: 5.2 x 5.1 x 4.2 cm = volume: 57.3 mL. Diffusely echogenic. Pulsed Doppler evaluation of both ovaries demonstrates slightly diminished but normal low-resistance arterial and venous waveforms in the right ovary and absent flow within the central left ovary. Arterial and venous waveforms are detected along the periphery of the left ovary. Other findings No abnormal free fluid. IMPRESSION: 1. Asymmetrically enlarged and edematous appearance of the left ovary with absent internal vascularity, suspicious for  ovarian torsion in the setting of acute onset left pelvic pain. 2. Mildly heterogeneous appearance of the right ovary with slightly diminished but present internal vascularity. Findings may reflect an ovary containing multiple follicles/cysts, suboptimally seen on ultrasound examination. Recommend correlation with right pelvic tenderness on physical examination. Critical Value/emergent results were called by telephone at the time of interpretation on 05/19/2023 at 11:08 am to provider Dr. Jearld Fenton, who verbally acknowledged these results. Electronically Signed   By: Agustin Cree M.D.   On: 05/19/2023 11:14   CT Renal Stone Study  Result Date: 05/19/2023 CLINICAL DATA:  Abdominal/flank pain, stone suspected. Left flank pain. EXAM: CT ABDOMEN AND PELVIS WITHOUT CONTRAST TECHNIQUE: Multidetector CT imaging of the abdomen and pelvis was performed following the standard protocol without IV contrast. RADIATION DOSE REDUCTION: This exam was performed according to the departmental dose-optimization program which includes automated exposure control, adjustment of the mA and/or kV according to patient size and/or use of iterative reconstruction technique. COMPARISON:  CT abdomen/pelvis 04/21/2015. FINDINGS: Lower chest: No  acute abnormality. Hepatobiliary: No focal liver abnormality is seen. No gallstones, gallbladder wall thickening, or biliary dilatation. Pancreas: Unremarkable. No pancreatic ductal dilatation or surrounding inflammatory changes. Spleen: Normal in size without focal abnormality. Adrenals/Urinary Tract: Adrenal glands are unremarkable. Bilateral punctate calyceal calculi. No hydronephrosis or hydroureter. Bladder is unremarkable. Stomach/Bowel: Normal stomach and duodenum. No dilated loops of small bowel. Normal appendix is visualized on axial image 39 series 2. Colon is unremarkable. No bowel wall thickening or surrounding inflammation. 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 No enlarged abdominal or pelvic lymph nodes. Reproductive: Prior hysterectomy. Markedly enlarged left ovary, measuring up to 5.0 cm (axial image 60 series 2). Other: No abdominal wall hernia or abnormality. No abdominopelvic ascites. Musculoskeletal: No acute or significant osseous findings. IMPRESSION: 1. Markedly enlarged left ovary, measuring up to 5.0 cm. Recommend pelvic ultrasound for further evaluation. 2. Bilateral punctate calyceal calculi. No hydronephrosis or hydroureter. 3. 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 CTA of the abdomen and pelvis is recommended for further characterization. Critical Value/emergent results were called by telephone at the time of interpretation on 05/19/2023 at 9:22 am to provider Renato Battles, PA-C, who verbally acknowledged these results. Electronically Signed   By: Orvan Falconer M.D.   On: 05/19/2023 09:22    Assessment: Principal Problem:   Pelvic pain in female   Plan: Admit to Women's Unit Pt is s/p hysterectomy and fertility is not an issue.  As patient is stable, admit to unit with  scheduled diagnostic scope on 05/20/23. Npo at midnight. Routine floor care    Mariel Aloe, MD, FACOG Obstetrician & Gynecologist, [facility] for [facility], [facility]

## 2023-05-19 NOTE — ED Notes (Signed)
Pt c/o left flank pain/left abdominal pain.  Pt reports hx of kidney stones Per pt does not feel like her last kidney stone.  +nausea

## 2023-05-19 NOTE — ED Notes (Signed)
Pt to ultrasound

## 2023-05-20 ENCOUNTER — Inpatient Hospital Stay (HOSPITAL_COMMUNITY): Payer: 59 | Admitting: Certified Registered Nurse Anesthetist

## 2023-05-20 ENCOUNTER — Other Ambulatory Visit: Payer: Self-pay

## 2023-05-20 ENCOUNTER — Encounter (HOSPITAL_COMMUNITY)
Admission: EM | Disposition: A | Discharge status: Home / Self Care | Payer: Self-pay | Source: Home / Self Care | Attending: Obstetrics and Gynecology

## 2023-05-20 ENCOUNTER — Encounter (HOSPITAL_COMMUNITY): Payer: Self-pay | Admitting: Obstetrics and Gynecology

## 2023-05-20 DIAGNOSIS — N83202 Unspecified ovarian cyst, left side: Secondary | ICD-10-CM

## 2023-05-20 DIAGNOSIS — Z5331 Laparoscopic surgical procedure converted to open procedure: Secondary | ICD-10-CM

## 2023-05-20 DIAGNOSIS — R102 Pelvic and perineal pain: Principal | ICD-10-CM

## 2023-05-20 DIAGNOSIS — K660 Peritoneal adhesions (postprocedural) (postinfection): Secondary | ICD-10-CM

## 2023-05-20 HISTORY — PX: LAPAROSCOPY: SHX197

## 2023-05-20 HISTORY — PX: LAPAROTOMY: SHX154

## 2023-05-20 LAB — CBC
HCT: 41.6 % (ref 36.0–46.0)
HCT: 38.9 % (ref 36.0–46.0)
Hemoglobin: 13.7 g/dL (ref 12.0–15.0)
Hemoglobin: 13 g/dL (ref 12.0–15.0)
MCH: 28.7 pg (ref 26.0–34.0)
MCH: 28 pg (ref 26.0–34.0)
MCHC: 32.9 g/dL (ref 30.0–36.0)
MCHC: 33.4 g/dL (ref 30.0–36.0)
MCV: 87.2 fL (ref 80.0–100.0)
MCV: 83.8 fL (ref 80.0–100.0)
Platelets: 187 10*3/uL (ref 150–400)
Platelets: 216 10*3/uL (ref 150–400)
RBC: 4.77 MIL/uL (ref 3.87–5.11)
RBC: 4.64 MIL/uL (ref 3.87–5.11)
RDW: 14.6 % (ref 11.5–15.5)
RDW: 14.5 % (ref 11.5–15.5)
WBC: 8.3 10*3/uL (ref 4.0–10.5)
WBC: 16 10*3/uL — ABNORMAL HIGH (ref 4.0–10.5)
nRBC: 0 % (ref 0.0–0.2)
nRBC: 0 % (ref 0.0–0.2)

## 2023-05-20 LAB — TYPE AND SCREEN
ABO/RH(D): B POS
Antibody Screen: NEGATIVE

## 2023-05-20 LAB — GLUCOSE, CAPILLARY
Glucose-Capillary: 182 mg/dL — ABNORMAL HIGH (ref 70–99)
Glucose-Capillary: 116 mg/dL — ABNORMAL HIGH (ref 70–99)
Glucose-Capillary: 180 mg/dL — ABNORMAL HIGH (ref 70–99)
Glucose-Capillary: 213 mg/dL — ABNORMAL HIGH (ref 70–99)

## 2023-05-20 SURGERY — LAPAROSCOPY, DIAGNOSTIC
Anesthesia: General

## 2023-05-20 MED ORDER — LACTATED RINGERS IV SOLN: INTRAVENOUS | Status: DC

## 2023-05-20 MED ORDER — FENTANYL CITRATE (PF) 100 MCG/2ML IJ SOLN
INTRAMUSCULAR | Status: AC
  Filled 2023-05-20: qty 2

## 2023-05-20 MED ORDER — ACETAMINOPHEN 500 MG PO TABS
1000.0000 mg | ORAL_TABLET | ORAL | Status: AC
  Administered 2023-05-20: 1000 mg via ORAL
  Filled 2023-05-20: qty 2

## 2023-05-20 MED ORDER — LIDOCAINE 2% (20 MG/ML) 5 ML SYRINGE
INTRAMUSCULAR | Status: AC
  Filled 2023-05-20: qty 10

## 2023-05-20 MED ORDER — CHLORHEXIDINE GLUCONATE 0.12 % MT SOLN
OROMUCOSAL | Status: AC
  Administered 2023-05-20: 15 mL via OROMUCOSAL
  Filled 2023-05-20: qty 15

## 2023-05-20 MED ORDER — SODIUM CHLORIDE 0.9% FLUSH: 9.0000 mL | INTRAVENOUS | Status: DC | PRN

## 2023-05-20 MED ORDER — SUCCINYLCHOLINE CHLORIDE 200 MG/10ML IV SOSY
PREFILLED_SYRINGE | INTRAVENOUS | Status: AC
  Filled 2023-05-20: qty 10

## 2023-05-20 MED ORDER — ENOXAPARIN SODIUM 40 MG/0.4ML IJ SOSY
40.0000 mg | PREFILLED_SYRINGE | INTRAMUSCULAR | Status: DC
  Administered 2023-05-21 – 2023-05-24 (×4): 40 mg via SUBCUTANEOUS
  Filled 2023-05-20 (×4): qty 0.4

## 2023-05-20 MED ORDER — FENTANYL CITRATE (PF) 250 MCG/5ML IJ SOLN
INTRAMUSCULAR | Status: AC
  Filled 2023-05-20: qty 5

## 2023-05-20 MED ORDER — HYDROMORPHONE 1 MG/ML IV SOLN
INTRAVENOUS | Status: DC
  Administered 2023-05-20: 30 mg via INTRAVENOUS
  Filled 2023-05-20: qty 30

## 2023-05-20 MED ORDER — ACETAMINOPHEN 500 MG PO TABS: 1000.0000 mg | ORAL_TABLET | Freq: Once | ORAL | Status: DC | PRN

## 2023-05-20 MED ORDER — MIDAZOLAM HCL 2 MG/2ML IJ SOLN
INTRAMUSCULAR | Status: AC
  Filled 2023-05-20: qty 2

## 2023-05-20 MED ORDER — LIDOCAINE 2% (20 MG/ML) 5 ML SYRINGE
INTRAMUSCULAR | Status: DC | PRN
  Administered 2023-05-20: 60 mg via INTRAVENOUS

## 2023-05-20 MED ORDER — NALOXONE HCL 0.4 MG/ML IJ SOLN: 0.4000 mg | INTRAMUSCULAR | Status: DC | PRN

## 2023-05-20 MED ORDER — BUPIVACAINE HCL 0.25 % IJ SOLN
INTRAMUSCULAR | Status: DC | PRN
  Administered 2023-05-20: 4 mL

## 2023-05-20 MED ORDER — DIPHENHYDRAMINE HCL 12.5 MG/5ML PO ELIX: 12.5000 mg | ORAL_SOLUTION | Freq: Four times a day (QID) | ORAL | Status: DC | PRN

## 2023-05-20 MED ORDER — INSULIN ASPART 100 UNIT/ML IJ SOLN
0.0000 [IU] | INTRAMUSCULAR | Status: DC
  Administered 2023-05-20: 3 [IU] via SUBCUTANEOUS
  Administered 2023-05-21 (×2): 2 [IU] via SUBCUTANEOUS
  Administered 2023-05-21: 3 [IU] via SUBCUTANEOUS
  Administered 2023-05-21 (×3): 2 [IU] via SUBCUTANEOUS
  Administered 2023-05-22: 1 [IU] via SUBCUTANEOUS
  Administered 2023-05-22 (×4): 2 [IU] via SUBCUTANEOUS
  Administered 2023-05-22: 3 [IU] via SUBCUTANEOUS
  Administered 2023-05-23 (×3): 1 [IU] via SUBCUTANEOUS

## 2023-05-20 MED ORDER — PHENYLEPHRINE HCL-NACL 20-0.9 MG/250ML-% IV SOLN
INTRAVENOUS | Status: DC | PRN
  Administered 2023-05-20: 25 ug/min via INTRAVENOUS

## 2023-05-20 MED ORDER — HYDROMORPHONE HCL 1 MG/ML IJ SOLN
INTRAMUSCULAR | Status: AC
  Filled 2023-05-20: qty 0.5

## 2023-05-20 MED ORDER — ORAL CARE MOUTH RINSE: 15.0000 mL | Freq: Once | OROMUCOSAL | Status: AC

## 2023-05-20 MED ORDER — BUPIVACAINE HCL (PF) 0.25 % IJ SOLN
INTRAMUSCULAR | Status: AC
  Filled 2023-05-20: qty 30

## 2023-05-20 MED ORDER — PROPOFOL 10 MG/ML IV BOLUS
INTRAVENOUS | Status: AC
  Filled 2023-05-20: qty 20

## 2023-05-20 MED ORDER — SODIUM CHLORIDE 0.9 % IR SOLN
Status: DC | PRN
  Administered 2023-05-20: 3000 mL

## 2023-05-20 MED ORDER — CEFAZOLIN SODIUM-DEXTROSE 2-4 GM/100ML-% IV SOLN
2.0000 g | INTRAVENOUS | Status: AC
  Administered 2023-05-20: 2 g via INTRAVENOUS

## 2023-05-20 MED ORDER — FENTANYL CITRATE (PF) 100 MCG/2ML IJ SOLN
25.0000 ug | INTRAMUSCULAR | Status: DC | PRN
  Administered 2023-05-20: 50 ug via INTRAVENOUS

## 2023-05-20 MED ORDER — SUCCINYLCHOLINE 20MG/ML (10ML) SYRINGE FOR MEDFUSION PUMP - OPTIME
INTRAMUSCULAR | Status: DC | PRN
  Administered 2023-05-20: 140 mg via INTRAVENOUS

## 2023-05-20 MED ORDER — HYDROMORPHONE HCL 1 MG/ML IJ SOLN
INTRAMUSCULAR | Status: DC | PRN
  Administered 2023-05-20: .5 mg via INTRAVENOUS

## 2023-05-20 MED ORDER — SOD CITRATE-CITRIC ACID 500-334 MG/5ML PO SOLN: 30.0000 mL | ORAL | Status: AC

## 2023-05-20 MED ORDER — PHENYLEPHRINE 80 MCG/ML (10ML) SYRINGE FOR IV PUSH (FOR BLOOD PRESSURE SUPPORT)
PREFILLED_SYRINGE | INTRAVENOUS | Status: DC | PRN
  Administered 2023-05-20: 160 ug via INTRAVENOUS
  Administered 2023-05-20: 80 ug via INTRAVENOUS
  Administered 2023-05-20: 240 ug via INTRAVENOUS
  Administered 2023-05-20: 160 ug via INTRAVENOUS
  Administered 2023-05-20: 80 ug via INTRAVENOUS
  Administered 2023-05-20: 160 ug via INTRAVENOUS

## 2023-05-20 MED ORDER — DIPHENHYDRAMINE HCL 50 MG/ML IJ SOLN: 12.5000 mg | Freq: Four times a day (QID) | INTRAMUSCULAR | Status: DC | PRN

## 2023-05-20 MED ORDER — DEXAMETHASONE SODIUM PHOSPHATE 10 MG/ML IJ SOLN
INTRAMUSCULAR | Status: AC
  Filled 2023-05-20: qty 2

## 2023-05-20 MED ORDER — INSULIN ASPART 100 UNIT/ML IJ SOLN: 0.0000 [IU] | INTRAMUSCULAR | Status: DC | PRN

## 2023-05-20 MED ORDER — ACETAMINOPHEN 160 MG/5ML PO SOLN: 1000.0000 mg | Freq: Once | ORAL | Status: DC | PRN

## 2023-05-20 MED ORDER — FENTANYL CITRATE (PF) 250 MCG/5ML IJ SOLN
INTRAMUSCULAR | Status: DC | PRN
  Administered 2023-05-20 (×2): 25 ug via INTRAVENOUS
  Administered 2023-05-20: 50 ug via INTRAVENOUS
  Administered 2023-05-20: 25 ug via INTRAVENOUS
  Administered 2023-05-20 (×2): 50 ug via INTRAVENOUS
  Administered 2023-05-20: 25 ug via INTRAVENOUS

## 2023-05-20 MED ORDER — DEXAMETHASONE SODIUM PHOSPHATE 10 MG/ML IJ SOLN
INTRAMUSCULAR | Status: DC | PRN
  Administered 2023-05-20: 5 mg via INTRAVENOUS

## 2023-05-20 MED ORDER — OXYCODONE HCL 5 MG PO TABS: 5.0000 mg | ORAL_TABLET | Freq: Once | ORAL | Status: DC | PRN

## 2023-05-20 MED ORDER — OXYCODONE HCL 5 MG PO TABS
5.0000 mg | ORAL_TABLET | ORAL | Status: DC | PRN
  Administered 2023-05-22 – 2023-05-25 (×9): 5 mg via ORAL
  Filled 2023-05-20 (×9): qty 1

## 2023-05-20 MED ORDER — ONDANSETRON HCL 4 MG/2ML IJ SOLN
INTRAMUSCULAR | Status: DC | PRN
  Administered 2023-05-20: 4 mg via INTRAVENOUS

## 2023-05-20 MED ORDER — SOD CITRATE-CITRIC ACID 500-334 MG/5ML PO SOLN
ORAL | Status: AC
  Administered 2023-05-20: 30 mL via ORAL
  Filled 2023-05-20: qty 30

## 2023-05-20 MED ORDER — OXYCODONE HCL 5 MG PO TABS
10.0000 mg | ORAL_TABLET | Freq: Four times a day (QID) | ORAL | Status: DC | PRN
  Administered 2023-05-20 – 2023-05-21 (×5): 10 mg via ORAL
  Filled 2023-05-20 (×6): qty 2

## 2023-05-20 MED ORDER — EPHEDRINE SULFATE-NACL 50-0.9 MG/10ML-% IV SOSY
PREFILLED_SYRINGE | INTRAVENOUS | Status: DC | PRN
  Administered 2023-05-20: 5 mg via INTRAVENOUS

## 2023-05-20 MED ORDER — POVIDONE-IODINE 10 % EX SWAB
2.0000 | Freq: Once | CUTANEOUS | Status: AC
  Administered 2023-05-20: 2 via TOPICAL

## 2023-05-20 MED ORDER — GABAPENTIN 100 MG PO CAPS
200.0000 mg | ORAL_CAPSULE | Freq: Two times a day (BID) | ORAL | Status: DC
  Administered 2023-05-20 – 2023-05-21 (×3): 200 mg via ORAL
  Filled 2023-05-20 (×3): qty 2

## 2023-05-20 MED ORDER — PROPOFOL 10 MG/ML IV BOLUS
INTRAVENOUS | Status: DC | PRN
Start: 2023-05-20 — End: 2023-05-20
  Administered 2023-05-20: 20 mg via INTRAVENOUS
  Administered 2023-05-20: 130 mg via INTRAVENOUS

## 2023-05-20 MED ORDER — PHENOL 1.4 % MT LIQD
1.0000 | OROMUCOSAL | Status: DC | PRN
  Administered 2023-05-20: 1 via OROMUCOSAL
  Filled 2023-05-20: qty 177

## 2023-05-20 MED ORDER — CHLORHEXIDINE GLUCONATE 0.12 % MT SOLN: 15.0000 mL | Freq: Once | OROMUCOSAL | Status: AC

## 2023-05-20 MED ORDER — SUGAMMADEX SODIUM 200 MG/2ML IV SOLN
INTRAVENOUS | Status: DC | PRN
  Administered 2023-05-20: 200 mg via INTRAVENOUS

## 2023-05-20 MED ORDER — ONDANSETRON HCL 4 MG/2ML IJ SOLN
4.0000 mg | Freq: Four times a day (QID) | INTRAMUSCULAR | Status: DC | PRN
  Administered 2023-05-20 – 2023-05-21 (×4): 4 mg via INTRAVENOUS
  Filled 2023-05-20 (×4): qty 2

## 2023-05-20 MED ORDER — ONDANSETRON HCL 4 MG/2ML IJ SOLN: 4.0000 mg | Freq: Four times a day (QID) | INTRAMUSCULAR | Status: DC | PRN

## 2023-05-20 MED ORDER — OXYCODONE HCL 5 MG/5ML PO SOLN: 5.0000 mg | Freq: Once | ORAL | Status: DC | PRN

## 2023-05-20 MED ORDER — ONDANSETRON HCL 4 MG/2ML IJ SOLN
INTRAMUSCULAR | Status: AC
  Filled 2023-05-20: qty 4

## 2023-05-20 MED ORDER — DOCUSATE SODIUM 100 MG PO CAPS
100.0000 mg | ORAL_CAPSULE | Freq: Two times a day (BID) | ORAL | Status: DC
  Administered 2023-05-20 – 2023-05-25 (×9): 100 mg via ORAL
  Filled 2023-05-20 (×10): qty 1

## 2023-05-20 MED ORDER — ROCURONIUM BROMIDE 10 MG/ML (PF) SYRINGE
PREFILLED_SYRINGE | INTRAVENOUS | Status: AC
  Filled 2023-05-20: qty 20

## 2023-05-20 MED ORDER — ONDANSETRON HCL 4 MG PO TABS
4.0000 mg | ORAL_TABLET | Freq: Four times a day (QID) | ORAL | Status: DC | PRN
  Filled 2023-05-20: qty 1

## 2023-05-20 MED ORDER — PHENYLEPHRINE 80 MCG/ML (10ML) SYRINGE FOR IV PUSH (FOR BLOOD PRESSURE SUPPORT)
PREFILLED_SYRINGE | INTRAVENOUS | Status: AC
  Filled 2023-05-20: qty 10

## 2023-05-20 MED ORDER — ROCURONIUM BROMIDE 10 MG/ML (PF) SYRINGE
PREFILLED_SYRINGE | INTRAVENOUS | Status: DC | PRN
  Administered 2023-05-20: 10 mg via INTRAVENOUS
  Administered 2023-05-20: 30 mg via INTRAVENOUS
  Administered 2023-05-20: 50 mg via INTRAVENOUS

## 2023-05-20 MED ORDER — KETOROLAC TROMETHAMINE 30 MG/ML IJ SOLN
15.0000 mg | Freq: Four times a day (QID) | INTRAMUSCULAR | Status: AC
  Administered 2023-05-20 – 2023-05-21 (×3): 15 mg via INTRAVENOUS
  Filled 2023-05-20 (×3): qty 1

## 2023-05-20 MED ORDER — ACETAMINOPHEN 10 MG/ML IV SOLN: 1000.0000 mg | Freq: Once | INTRAVENOUS | Status: DC | PRN

## 2023-05-20 MED ORDER — KETOROLAC TROMETHAMINE 30 MG/ML IJ SOLN
INTRAMUSCULAR | Status: AC
  Filled 2023-05-20: qty 1

## 2023-05-20 MED ORDER — INSULIN ASPART 100 UNIT/ML IJ SOLN: 0.0000 [IU] | INTRAMUSCULAR | Status: DC

## 2023-05-20 MED ORDER — CEFAZOLIN SODIUM-DEXTROSE 2-4 GM/100ML-% IV SOLN
INTRAVENOUS | Status: AC
  Filled 2023-05-20: qty 100

## 2023-05-20 MED ORDER — SIMETHICONE 80 MG PO CHEW: 80.0000 mg | CHEWABLE_TABLET | Freq: Four times a day (QID) | ORAL | Status: DC | PRN

## 2023-05-20 MED ORDER — MIDAZOLAM HCL 2 MG/2ML IJ SOLN
INTRAMUSCULAR | Status: DC | PRN
  Administered 2023-05-20: 2 mg via INTRAVENOUS

## 2023-05-20 SURGICAL SUPPLY — 39 items
ADH SKN CLS APL DERMABOND .7 (GAUZE/BANDAGES/DRESSINGS) ×2
BLADE SURG 10 STRL SS (BLADE) IMPLANT
DERMABOND ADVANCED .7 DNX12 (GAUZE/BANDAGES/DRESSINGS) ×2 IMPLANT
DRAPE WARM FLUID 44X44 (DRAPES) IMPLANT
DRSG OPSITE POSTOP 3X4 (GAUZE/BANDAGES/DRESSINGS) ×2 IMPLANT
DURAPREP 26ML APPLICATOR (WOUND CARE) ×2 IMPLANT
GLOVE BIOGEL PI IND STRL 8 (GLOVE) ×2 IMPLANT
GLOVE SURG ORTHO 8.0 STRL STRW (GLOVE) ×2 IMPLANT
GOWN STRL REUS W/ TWL LRG LVL3 (GOWN DISPOSABLE) ×2 IMPLANT
GOWN STRL REUS W/ TWL XL LVL3 (GOWN DISPOSABLE) ×2 IMPLANT
GOWN STRL REUS W/TWL LRG LVL3 (GOWN DISPOSABLE) ×2
GOWN STRL REUS W/TWL XL LVL3 (GOWN DISPOSABLE) ×2
KIT PINK PAD W/HEAD ARE REST (MISCELLANEOUS) ×4 IMPLANT
KIT PINK PAD W/HEAD ARM REST (MISCELLANEOUS) ×4 IMPLANT
KIT TURNOVER KIT B (KITS) ×2 IMPLANT
LIGASURE VESSEL 5MM BLUNT TIP (ELECTROSURGICAL) ×2 IMPLANT
NS IRRIG 1000ML POUR BTL (IV SOLUTION) ×2 IMPLANT
PACK LAPAROSCOPY BASIN (CUSTOM PROCEDURE TRAY) ×2 IMPLANT
PAD ELECT DISPERSIVE GRND ADPT (ELECTRODE) IMPLANT
PENCIL BUTTON HOLSTER BLD 10FT (ELECTRODE) IMPLANT
PROTECTOR NERVE ULNAR (MISCELLANEOUS) ×4 IMPLANT
SET TUBE SMOKE EVAC HIGH FLOW (TUBING) ×2 IMPLANT
SLEEVE XCEL OPT CAN 5 100 (ENDOMECHANICALS) ×2 IMPLANT
SPONGE T-LAP 18X18 ~~LOC~~+RFID (SPONGE) IMPLANT
STAPLER VISISTAT 35W (STAPLE) IMPLANT
SUT MNCRL AB 4-0 PS2 18 (SUTURE) ×2 IMPLANT
SUT PDS AB 0 CT 36 (SUTURE) IMPLANT
SUT PDS AB 0 CTX 36 PDP370T (SUTURE) IMPLANT
SUT PLAIN 2 0 (SUTURE) ×2
SUT PLAIN ABS 2-0 CT1 27XMFL (SUTURE) IMPLANT
SUT SILK 3 0 SH CR/8 (SUTURE) IMPLANT
SUT VIC AB 3-0 SH 27 (SUTURE) ×2
SUT VIC AB 3-0 SH 27XBRD (SUTURE) IMPLANT
SUT VICRYL 0 UR6 27IN ABS (SUTURE) ×2 IMPLANT
TOWEL GREEN STERILE FF (TOWEL DISPOSABLE) ×4 IMPLANT
TRAY FOLEY W/BAG SLVR 14FR (SET/KITS/TRAYS/PACK) ×2 IMPLANT
TROCAR 11X100 Z THREAD (TROCAR) ×2 IMPLANT
TROCAR XCEL NON-BLD 5MMX100MML (ENDOMECHANICALS) ×2 IMPLANT
WARMER LAPAROSCOPE (MISCELLANEOUS) ×2 IMPLANT

## 2023-05-20 NOTE — Op Note (Signed)
05/20/2023  2:18 PM  PATIENT:  Maria [name]  42 y.o. female  PRE-OPERATIVE DIAGNOSIS:  pelvic pain, adhesions  POST-OPERATIVE DIAGNOSIS: Adhesions PELVIC PAIN, OVARIAN CYST  PROCEDURE:  Procedure(s): LAPAROSCOPY DIAGNOSTIC (N/A) EXPLORATORY LAPAROTOMY WITH LYSIS OF ADHESIONS, SEROSA REPAIR, EVACUATION OF HEMMORHAGIC OVARIAN CYST  SURGEON:  Surgeons and Role:    * Warden Fillers, MD - Primary    Karie Soda, MD - Assisting    * Axel Filler, MD - Assisting    * Joanne Gavel, MD - Assisting   ASSISTANTS: none   ANESTHESIA:   general  EBL:  minimal   BLOOD ADMINISTERED:none  DRAINS: none   LOCAL MEDICATIONS USED:  NONE  SPECIMEN:  No Specimen  DISPOSITION OF SPECIMEN:  N/A  COUNTS:  YES  TOURNIQUET:  * No tourniquets in log *  DICTATION: .Dragon Dictation  I was consulted IntraOp secondary to patient's intra-abdominal adhesions please see Dr. Laverna Peace note for indication procedure.  Details: Upon my arrival to the OR patient had a open Pfannenstiel incision.  There was some omental and small bowel adhesions to the Pfannenstiel midline area.  At this time I proceeded to elongate the midline linea alba cephalad.  I was able to see some of the small bowel however cannot fully expose or take down the omental adhesions from anterior abdominal wall.  I did this point I decided to make a lower midline incision.  Cautery used to maintain hemostasis and dissection of the subcutaneous tissue and the linea alba was taken cephalad.  At this time I was able to place my hand into the abdominal cavity.  The omentum was taken off the anterior abdominal wall.  This was done with the help of Dr. Michaell Cowing.  The small bowel and colon were taken off the anterior abdominal wall.  We worked both from the lateral to medial edges.  At this point there was an area that could be seen of the sigmoid colon that appeared to have a serosal tear.  This was closed using a 3-0 silk in Lembert  fashion x 3.  The small bowel was visualized and seen to be without injury.  At this time we were able to take down the sigmoid colon from the intra-abdominal wall.  The lateral attachments of the left pelvis were taken down bluntly.  At this time it was evident there was a large ovarian cyst that was hemorrhagic.  With mobilization this was entered and drained.  Dr. Donavan Foil was able to suture ligate any bleeding.  At this time the omentum was brought over the midline fascia.  Dr. Donavan Foil was closed in the fascia per his note.  The case was turned back over to Dr. Donavan Foil for closure.       PLAN OF CARE: Admit to inpatient   PATIENT DISPOSITION:  PACU - hemodynamically stable.   Delay start of Pharmacological VTE agent (>24hrs) due to surgical blood loss or risk of bleeding: Per primary team

## 2023-05-20 NOTE — Transfer of Care (Signed)
Immediate Anesthesia Transfer of Care Note  Patient: Maria [name]  Procedure(s) Performed: LAPAROSCOPY DIAGNOSTIC EXPLORATORY LAPAROTOMY WITH LYSIS OF ADHESIONS, SEROSA REPAIR, EVACUATION OF HEMMORHAGIC OVARIAN CYST  Patient Location: PACU  Anesthesia Type:General  Level of Consciousness: drowsy and patient cooperative  Airway & Oxygen Therapy: Patient Spontanous Breathing and Patient connected to face mask oxygen  Post-op Assessment: Report given to RN and Post -op Vital signs reviewed and stable  Post vital signs: Reviewed and stable  Last Vitals:  Vitals Value Taken Time  BP 126/87 05/20/23 1451  Temp    Pulse 105 05/20/23 1453  Resp 16 05/20/23 1453  SpO2 96 % 05/20/23 1453  Vitals shown include unfiled device data.  Last Pain:  Vitals:   05/20/23 1028  TempSrc: Oral  PainSc:          Complications: No notable events documented.

## 2023-05-20 NOTE — Interval H&P Note (Signed)
History and Physical Interval Note:  05/20/2023 11:07 AM  Maria [name]  has presented today for surgery, with the diagnosis of pelvic pain.  The various methods of treatment have been discussed with the patient and family. After consideration of risks, benefits and other options for treatment, the patient has consented to  Procedure(s): LAPAROSCOPY DIAGNOSTIC (N/A) as a surgical intervention.  The patient's history has been reviewed, patient examined, no change in status, stable for surgery.  I have reviewed the patient's chart and labs.  Questions were answered to the patient's satisfaction.   Risks and benefits of the procedure given including bleeding, infection, involvement of other organs including bladder and bowel.  If ovary cannot be salvaged it may be removed.  Warden Fillers

## 2023-05-20 NOTE — Op Note (Signed)
Karalee M Renaldo PROCEDURE DATE: 05/20/2023  PREOPERATIVE DIAGNOSIS: LLQ pain, possible left ovarian torsion POSTOPERATIVE DIAGNOSIS: Left hemorrhagic cyst, extensive abdominal adhesive disease PROCEDURE: Diagnostic laparoscopy converted to exploratory laparotomy, extensive lysis of adhesions (general surgery) SURGEON:  Dr. Mariel Aloe,  ASSISTANT: Therese Sarah, MD  Intra-op consult:  Axel Filler, MD  INDICATIONS: 42 y.o. with history of left lower quadrant pain suspicious for ovarian torsion by ultrasound. Please see preoperative notes for further details.  Risks of surgery were discussed with the patient including but not limited to: bleeding which may require transfusion or reoperation; infection which may require antibiotics; injury to bowel, bladder, ureters or other surrounding organs; need for additional procedures including laparotomy; thromboembolic phenomenon, incisional problems and other postoperative/anesthesia complications. Written informed consent was obtained.    FINDINGS:  absent uterus and cervix, extensive bowel adhesions to anterior abdominal wall, left hemorrhagic cyst ANESTHESIA:    General INTRAVENOUS FLUIDS: 1200 ml ESTIMATED BLOOD LOSS: 100 ml URINE OUTPUT: 500 ml SPECIMENS: none COMPLICATIONS: None immediate  PROCEDURE IN DETAIL:  The patient had sequential compression devices applied to her lower extremities while in the preoperative area.  She was then taken to the operating room where general anesthesia was administered and was found to be adequate.  She was placed in the dorsal lithotomy position, and was prepped and draped in a sterile manner.  A Foley catheter was inserted into her bladder and attached to constant drainage.  After an adequate timeout was performed, attention was turned to the abdomen where an umbilical incision was made with the scalpel.  The Optiview 5 mm trocar and sleeve were then advanced without difficulty with the laparoscope under  direct visualization into the abdomen.  The abdomen was then insufflated with carbon dioxide gas and adequate pneumoperitoneum was obtained.   Omental and bowel adhesions were noted extensively on the sidewalls  and anterior abdomen.  The right ovary was also adhered to the anterior abdominal wall.  The left ovary could not be visualized.  For safety decision was made to make small transverse incision for exploratory laparotomy.   A Pfannensteil skin incision was made. This incision was taken down to the fascia using electrocautery with care given to maintain good hemostasis. The fascia was incised in the midline and the fascial incision was then extended bilaterally using electrocautery without difficulty. The fascia was then dissected off the underlying rectus muscles using blunt and sharp dissection. The rectus muscles were split bluntly in the midline and the peritoneum entered sharply without complication.   However, omentum and bowel were present as well.  General surgery was called for intra-op consult and assistance with the bowel.  Please see general surgery note for details.  After general surgery dissection the left ovary was finally observed in the cul de sac.  The left ovary had a hemorrhagic cyst which had ruptured during the dissection.  The cyst was evacuated and 2 sutures of 3-0 vicryl were placed in the uterus for hemostasis.  This was followed by cautery. Good hemostasis was noted.     The pelvis was irrigated and hemostasis was reconfirmed at all pedicles.  All laparotomy sponges and instruments were removed from the abdomen. The fascia was also closed in a running fashion with 0 PDS suture both in the vertical and horizontal directions. The subcutaneous layer was closed using 3-0 plain gut suture. The skin was closed with staples. Sponge, lap, needle, and instrument counts were correct times three. The patient was taken to the recovery  area awake, extubated and in stable  condition.  Mariel Aloe, MD, FACOG Attending Obstetrician & Gynecologist Faculty Practice, [facility]

## 2023-05-20 NOTE — Anesthesia Procedure Notes (Signed)
Procedure Name: Intubation Date/Time: 05/20/2023 11:51 AM  Performed by: Dairl Ponder, CRNAPre-anesthesia Checklist: Patient identified, Emergency Drugs available, Suction available and Patient being monitored Patient Re-evaluated:Patient Re-evaluated prior to induction Oxygen Delivery Method: Circle System Utilized Preoxygenation: Pre-oxygenation with 100% oxygen Induction Type: IV induction, Cricoid Pressure applied and Rapid sequence Ventilation: Mask ventilation without difficulty Laryngoscope Size: Mac and 3 Grade View: Grade I Tube type: Oral Tube size: 7.0 mm Number of attempts: 1 Airway Equipment and Method: Stylet and Oral airway Placement Confirmation: ETT inserted through vocal cords under direct vision, positive ETCO2 and breath sounds checked- equal and bilateral Secured at: 20 cm Tube secured with: Tape Dental Injury: Teeth and Oropharynx as per pre-operative assessment

## 2023-05-20 NOTE — Progress Notes (Signed)
GYN Note 05/20/2023 2108 Called to see patient due to bleeding at the lower dressing site.  Inferior edge of the dressing was saturated with dark, old blood. I removed the entire dressing and she has an upside down T incision with a low transverse skin incision connected to a vertical midline skin incision along with an umbilical port site all closed with staples.  Patient with serosang appearing discharge coming from the intersection of the two incisions and also with a pool of old blood at the umbilical port site. There is some drainage that you can express that is sero sang at the intersection of the incisions  I suspect that this is normal drainage from the surgery that is coming through the weakest skin incision parts temporary dressing placed with gauze and ABD because I suspect this will need to be changed at some point and will get serial CBCs. I suspect the first to show a drop but expect to see it stable out.   Clear UOP from foley with about in bag (just emptied) and patient otherwise looks stable   Patient Vitals for the past 12 hrs:  BP Temp Temp src Pulse Resp SpO2 Height Weight  05/20/23 2001 (!) 140/87 98.8 F (37.1 C) Oral (!) 103 19 97 % -- --  05/20/23 1953 -- -- -- -- 20 100 % -- --  05/20/23 1610 -- -- -- -- 17 95 % -- --  05/20/23 1609 119/80 -- -- 91 15 95 % -- --  05/20/23 1530 130/84 98.6 F (37 C) -- 96 14 96 % -- --  05/20/23 1515 132/82 -- -- 98 15 95 % -- --  05/20/23 1500 (!) 130/92 -- -- (!) 108 19 96 % -- --  05/20/23 1451 126/87 98.3 F (36.8 C) -- (!) 112 -- 98 % -- --  05/20/23 1033 -- -- -- -- -- -- 4' 9.01" (1.448 m) 72.6 kg  05/20/23 1028 126/76 99 F (37.2 C) Oral 68 -- 97 % -- --      Latest Ref Rng & Units 05/20/2023    2:45 AM 05/19/2023    4:20 AM 05/19/2023    4:09 AM  CBC  WBC 4.0 - 10.5 K/uL 8.3  7.1    Hemoglobin 12.0 - 15.0 g/dL 16.1  09.6  04.5   Hematocrit 36.0 - 46.0 % 41.6  41.3  46.0   Platelets 150 - 400 K/uL 187  220      Cornelia Copa MD Attending Center for [facility] (Faculty Practice) GYN Consult Phone: [phone] (M-F, 0800-1700) & [phone] (Off hours, weekends, holidays)          Cornelia Copa MD Attending Center for [employer] (Faculty Practice) GYN Consult Phone: [phone] (M-F, [phone]) & [phone] (Off hours, weekends, holidays)

## 2023-05-20 NOTE — Anesthesia Postprocedure Evaluation (Signed)
Anesthesia Post Note  Patient: Maria [name]  Procedure(s) Performed: LAPAROSCOPY DIAGNOSTIC EXPLORATORY LAPAROTOMY WITH LYSIS OF ADHESIONS, SEROSA REPAIR, EVACUATION OF HEMMORHAGIC OVARIAN CYST     Patient location during evaluation: PACU Anesthesia Type: General Level of consciousness: awake and alert Pain management: pain level controlled Vital Signs Assessment: post-procedure vital signs reviewed and stable Respiratory status: spontaneous breathing, nonlabored ventilation and respiratory function stable Cardiovascular status: blood pressure returned to baseline and stable Postop Assessment: no apparent nausea or vomiting Anesthetic complications: no  No notable events documented.  Last Vitals:  Vitals:   05/20/23 1609 05/20/23 1610  BP: 119/80   Pulse: 91   Resp: 15 17  Temp:    SpO2: 95% 95%    Last Pain:  Vitals:   05/20/23 1610  TempSrc:   PainSc: Asleep                 Meda Dudzinski

## 2023-05-20 NOTE — Anesthesia Preprocedure Evaluation (Signed)
Anesthesia Evaluation  Patient identified by MRN, date of birth, ID band Patient awake    Reviewed: Allergy & Precautions, NPO status , Patient's Chart, lab work & pertinent test results  History of Anesthesia Complications Negative for: history of anesthetic complications  Airway Mallampati: III  TM Distance: >3 FB Neck ROM: Full    Dental  (+) Teeth Intact, Dental Advisory Given   Pulmonary neg shortness of breath, neg sleep apnea, neg COPD, neg recent URI, Current Smoker and Patient abstained from smoking.   breath sounds clear to auscultation       Cardiovascular negative cardio ROS  Rhythm:Regular     Neuro/Psych  Headaches  negative psych ROS   GI/Hepatic Neg liver ROS,GERD  ,,  Endo/Other  diabetes, Type 2    Renal/GU Lab Results      Component                Value               Date                      CREATININE               0.87                05/19/2023                Musculoskeletal negative musculoskeletal ROS (+)    Abdominal   Peds  Hematology Lab Results      Component                Value               Date                      WBC                      8.3                 05/20/2023                HGB                      13.7                05/20/2023                HCT                      41.6                05/20/2023                MCV                      87.2                05/20/2023                PLT                      187                 05/20/2023              Anesthesia Other Findings Pelvic pain, s/p hysterectomy  Reproductive/Obstetrics  Anesthesia Physical Anesthesia Plan  ASA: 2  Anesthesia Plan: General   Post-op Pain Management: Ofirmev IV (intra-op)*   Induction: Intravenous, Rapid sequence and Cricoid pressure planned  PONV Risk Score and Plan: 2 and Ondansetron and Dexamethasone  Airway Management Planned:  Oral ETT  Additional Equipment: None  Intra-op Plan:   Post-operative Plan: Extubation in OR  Informed Consent: I have reviewed the patients History and Physical, chart, labs and discussed the procedure including the risks, benefits and alternatives for the proposed anesthesia with the patient or authorized representative who has indicated his/her understanding and acceptance.     Dental advisory given  Plan Discussed with: CRNA  Anesthesia Plan Comments:        Anesthesia Quick Evaluation

## 2023-05-21 ENCOUNTER — Encounter (HOSPITAL_COMMUNITY): Payer: Self-pay | Admitting: Obstetrics and Gynecology

## 2023-05-21 LAB — CBC
HCT: 40.2 % (ref 36.0–46.0)
Hemoglobin: 13.5 g/dL (ref 12.0–15.0)
MCH: 28.5 pg (ref 26.0–34.0)
MCHC: 33.6 g/dL (ref 30.0–36.0)
MCV: 85 fL (ref 80.0–100.0)
Platelets: 222 10*3/uL (ref 150–400)
RBC: 4.73 MIL/uL (ref 3.87–5.11)
RDW: 14.5 % (ref 11.5–15.5)
WBC: 16.7 10*3/uL — ABNORMAL HIGH (ref 4.0–10.5)
nRBC: 0 % (ref 0.0–0.2)

## 2023-05-21 LAB — COMPREHENSIVE METABOLIC PANEL
ALT: 23 U/L (ref 0–44)
AST: 39 U/L (ref 15–41)
Albumin: 3.7 g/dL (ref 3.5–5.0)
Alkaline Phosphatase: 52 U/L (ref 38–126)
Anion gap: 6 (ref 5–15)
BUN: 10 mg/dL (ref 6–20)
CO2: 27 mmol/L (ref 22–32)
Calcium: 8.8 mg/dL — ABNORMAL LOW (ref 8.9–10.3)
Chloride: 101 mmol/L (ref 98–111)
Creatinine, Ser: 0.94 mg/dL (ref 0.44–1.00)
GFR, Estimated: >60 mL/min (ref >60)
Glucose, Bld: 168 mg/dL — ABNORMAL HIGH (ref 70–99)
Potassium: 4.1 mmol/L (ref 3.5–5.1)
Sodium: 134 mmol/L — ABNORMAL LOW (ref 135–145)
Total Bilirubin: 0.3 mg/dL (ref 0.3–1.2)
Total Protein: 6.6 g/dL (ref 6.5–8.1)

## 2023-05-21 LAB — GLUCOSE, CAPILLARY
Glucose-Capillary: 154 mg/dL — ABNORMAL HIGH (ref 70–99)
Glucose-Capillary: 154 mg/dL — ABNORMAL HIGH (ref 70–99)
Glucose-Capillary: 157 mg/dL — ABNORMAL HIGH (ref 70–99)
Glucose-Capillary: 194 mg/dL — ABNORMAL HIGH (ref 70–99)
Glucose-Capillary: 200 mg/dL — ABNORMAL HIGH (ref 70–99)
Glucose-Capillary: 214 mg/dL — ABNORMAL HIGH (ref 70–99)

## 2023-05-21 MED ORDER — METOCLOPRAMIDE HCL 10 MG PO TABS
10.0000 mg | ORAL_TABLET | Freq: Four times a day (QID) | ORAL | Status: DC | PRN
  Administered 2023-05-22 – 2023-05-23 (×3): 10 mg via ORAL
  Filled 2023-05-21 (×3): qty 1

## 2023-05-21 MED ORDER — ALUM & MAG HYDROXIDE-SIMETH 200-200-20 MG/5ML PO SUSP
30.0000 mL | Freq: Once | ORAL | Status: AC
  Administered 2023-05-21: 30 mL via ORAL
  Filled 2023-05-21: qty 30

## 2023-05-21 MED ORDER — HYDROMORPHONE HCL 1 MG/ML IJ SOLN
1.0000 mg | Freq: Once | INTRAMUSCULAR | Status: AC
  Administered 2023-05-21: 1 mg via INTRAVENOUS
  Filled 2023-05-21: qty 1

## 2023-05-21 MED ORDER — NON FORMULARY: 1.0000 | Status: DC

## 2023-05-21 MED ORDER — DULAGLUTIDE 1.5 MG/0.5ML ~~LOC~~ SOAJ: 1.5000 mg | SUBCUTANEOUS | Status: DC

## 2023-05-21 MED ORDER — PANTOPRAZOLE SODIUM 40 MG PO TBEC
40.0000 mg | DELAYED_RELEASE_TABLET | Freq: Every day | ORAL | Status: DC
  Administered 2023-05-21 – 2023-05-25 (×5): 40 mg via ORAL
  Filled 2023-05-21 (×5): qty 1

## 2023-05-21 MED ORDER — NON FORMULARY: Status: DC

## 2023-05-21 MED ORDER — METOCLOPRAMIDE HCL 10 MG PO TABS
5.0000 mg | ORAL_TABLET | Freq: Four times a day (QID) | ORAL | Status: DC | PRN
  Administered 2023-05-21: 5 mg via ORAL
  Filled 2023-05-21: qty 1

## 2023-05-21 NOTE — Progress Notes (Signed)
Gynecology Progress Note  Admission Date: 05/19/2023 Current Date: 05/21/2023 12:29 PM  Maria [name] is a 42 y.o. No obstetric history on file. HD#2 admitted for possible ovarian torsion, now diagnosed with left hemorrhagic cyst. POD 1 s/p diagnostic laparoscopy converted to exploratory laparotomy and lysis of adhesions.   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  She is tolerating clear liquids.  She has not passed flatus yet, but has no nausea.  Pain well controlled currently.  She complains of mild reflux.  Objective:   Vitals:   05/21/23 0210 05/21/23 0350 05/21/23 0804 05/21/23 0849  BP:  128/63 (!) 151/101 129/76  Pulse:  91 97 90  Resp:  18 19   Temp:  98.1 F (36.7 C) 98.1 F (36.7 C)   TempSrc:  Oral    SpO2: 98% 97% 98%   Weight:      Height:        Temp:  [98.1 F (36.7 C)-98.8 F (37.1 C)] 98.1 F (36.7 C) (07/21 0804) Pulse Rate:  [90-112] 90 (07/21 0849) Resp:  [14-20] 19 (07/21 0804) BP: (119-151)/(63-101) 129/76 (07/21 0849) SpO2:  [92 %-100 %] 98 % (07/21 0804) I/O last 3 completed shifts: In: 1330 [I.V.:1330] Out: 3200 [Urine:3100; Blood:100] Total I/O In: -  Out: 450 [Urine:450]  Intake/Output Summary (Last 24 hours) at 05/21/2023 1229 Last data filed at 05/21/2023 1000 Gross per 24 hour  Intake 1230 ml  Output 3650 ml  Net -2420 ml     Current Vital Signs 24h Vital Sign Ranges  T 98.1 F (36.7 C) Temp  Avg: 98.4 F (36.9 C)  Min: 98.1 F (36.7 C)  Max: 98.8 F (37.1 C)  BP 129/76 BP  Min: 119/80  Max: 151/101  HR 90 Pulse  Avg:  98.2  Min: 90  Max: 112  RR 19 Resp  Avg: 17.6  Min: 14  Max: 20  SaO2 98 % Room Air SpO2  Avg: 96.5 %  Min: 92 %  Max: 100 %       24 Hour I/O Current Shift I/O  Time Ins Outs 07/20 0701 - 07/21 0700 In: 1330 [I.V.:1330] Out: 3200 [Urine:3100] 07/21 0701 - 07/21 1900 In: -  Out: 450 [Urine:450]   Patient Vitals for the past 12 hrs:  BP Temp Temp src Pulse Resp SpO2  05/21/23 0849 129/76 -- -- 90 -- --  05/21/23 0804 (!) 151/101 98.1 F (36.7 C) -- 97 19 98 %  05/21/23 0350 128/63 98.1 F (36.7 C) Oral 91 18 97 %  05/21/23 0210 -- -- -- -- -- 98 %  05/21/23 0205 -- -- -- -- -- 96 %  05/21/23 0200 -- -- -- -- -- 97 %     Patient Vitals for the past 24 hrs:  BP Temp Temp src Pulse Resp SpO2  05/21/23 0849 129/76 -- -- 90 -- --  05/21/23 0804 (!) 151/101 98.1 F (36.7 C) -- 97 19 98 %  05/21/23 0350 128/63 98.1 F (36.7 C) Oral 91 18 97 %  05/21/23 0210 -- -- -- -- -- 98 %  05/21/23  0205 -- -- -- -- -- 96 %  05/21/23 0200 -- -- -- -- -- 97 %  05/21/23 0000 (!) 143/84 -- -- 96 20 99 %  05/20/23 2150 -- -- -- -- -- 97 %  05/20/23 2142 -- -- -- -- -- 92 %  05/20/23 2135 -- -- -- -- -- 95 %  05/20/23 2001 (!) 140/87 98.8 F (37.1 C) Oral (!) 103 19 97 %  05/20/23 1953 -- -- -- -- 20 100 %  05/20/23 1610 -- -- -- -- 17 95 %  05/20/23 1609 119/80 -- -- 91 15 95 %  05/20/23 1530 130/84 98.6 F (37 C) -- 96 14 96 %  05/20/23 1515 132/82 -- -- 98 15 95 %  05/20/23 1500 (!) 130/92 -- -- (!) 108 19 96 %  05/20/23 1451 126/87 98.3 F (36.8 C) -- (!) 112 -- 98 %    Physical exam: General appearance: alert, cooperative, and appears stated age Abdomen: soft, non-tender; bowel sounds normal; no masses,  no organomegaly GU: No gross VB Lungs: clear to auscultation bilaterally Heart: regular rate and rhythm Extremities: no lower extremity edema Skin: WNL Psych: appropriate Neurologic: Grossly normal  Medications Current Facility-Administered Medications  Medication Dose  Route Frequency Provider Last Rate Last Admin   albuterol (PROVENTIL) (2.5 MG/3ML) 0.083% nebulizer solution 2.5 mg  2.5 mg Inhalation Q6H PRN Warden Fillers, MD       atorvastatin (LIPITOR) tablet 10 mg  10 mg Oral q1800 Warden Fillers, MD   10 mg at 05/20/23 1705   docusate sodium (COLACE) capsule 100 mg  100 mg Oral BID Warden Fillers, MD   100 mg at 05/21/23 1029   enoxaparin (LOVENOX) injection 40 mg  40 mg Subcutaneous Q24H Warden Fillers, MD       escitalopram (LEXAPRO) tablet 20 mg  20 mg Oral QHS Warden Fillers, MD   20 mg at 05/20/23 2143   fluticasone (FLONASE) 50 MCG/ACT nasal spray 1 spray  1 spray Each Nare Daily PRN Warden Fillers, MD       gabapentin (NEURONTIN) capsule 200 mg  200 mg Oral BID Warden Fillers, MD   200 mg at 05/21/23 1029   hydrOXYzine (ATARAX) tablet 25 mg  25 mg Oral Q8H PRN Warden Fillers, MD   25 mg at 05/20/23 0031   insulin aspart (novoLOG) injection 0-9 Units  0-9 Units Subcutaneous Q4H Warden Fillers, MD   2 Units at 05/21/23 1223   lactated ringers infusion   Intravenous Continuous Warden Fillers, MD   Stopped at 05/20/23 1137   lactated ringers infusion   Intravenous Continuous Warden Fillers, MD       ondansetron [facility]) tablet 4 mg  4 mg Oral Q6H PRN Warden Fillers, MD       Or   ondansetron [facility]) injection 4 mg  4 mg Intravenous Q6H PRN Warden Fillers, MD   4 mg at 05/21/23 0815   oxyCODONE (Oxy IR/ROXICODONE) immediate release tablet 10 mg  10 mg Oral Q6H PRN Conan Bowens, MD   10 mg at 05/21/23 1029   oxyCODONE (Oxy IR/ROXICODONE) immediate release tablet 5 mg  5 mg Oral Q4H PRN Warden Fillers, MD       pantoprazole (PROTONIX) EC tablet 40 mg  40 mg Oral Daily Warden Fillers, MD   40 mg at 05/21/23 1214   phenol (CHLORASEPTIC) mouth spray 1 spray  1 spray  Mouth/Throat PRN Conan Bowens, MD   1 spray at 05/20/23 1705   simethicone (MYLICON) chewable tablet 80 mg  80 mg Oral QID PRN Warden Fillers, MD           Labs  Recent Labs  Lab 05/20/23 0245 05/20/23 2208 05/21/23 0446  WBC 8.3 16.0* 16.7*  HGB 13.7 13.0 13.5  HCT 41.6 38.9 40.2  PLT 187 216 222    Recent Labs  Lab 05/19/23 0409 05/19/23 0420 05/21/23 0446  NA 136 135 134*  K 4.1 4.1 4.1  CL 99 99 101  CO2  --  26 27  BUN 14 15 10   CREATININE 0.90 0.87 0.94  CALCIUM  --  9.4 8.8*  PROT  --  7.3 6.6  BILITOT  --  0.2* 0.3  ALKPHOS  --  65 52  ALT  --  23 23  AST  --  25 39  GLUCOSE 143* 147* 168*    Radiology N/a  Assessment & Plan:  POD 1 s/p exploratory laparotomy and extensive lysis of adhesions  Awaiting return of regular bowel function.  Advance diet slowly starting at dinner. Advise increased ambulation  Mariel Aloe, MD Attending Center for [employer] [facility])

## 2023-05-21 NOTE — Plan of Care (Signed)
Problem: Education: Goal: Knowledge of General Education information will improve Description: Including pain rating scale, medication(s)/side effects and non-pharmacologic comfort measures 05/21/2023 1910 by Samuella Cota, RN Outcome: Progressing 05/21/2023 1910 by Samuella Cota, RN Outcome: Progressing   Problem: Health Behavior/Discharge Planning: Goal: Ability to manage health-related needs will improve 05/21/2023 1910 by Samuella Cota, RN Outcome: Progressing 05/21/2023 1910 by Samuella Cota, RN Outcome: Progressing   Problem: Clinical Measurements: Goal: Ability to maintain clinical measurements within normal limits will improve 05/21/2023 1910 by Samuella Cota, RN Outcome: Progressing 05/21/2023 1910 by Samuella Cota, RN Outcome: Progressing Goal: Will remain free from infection 05/21/2023 1910 by Samuella Cota, RN Outcome: Progressing 05/21/2023 1910 by Samuella Cota, RN Outcome: Progressing Goal: Diagnostic test results will improve 05/21/2023 1910 by Samuella Cota, RN Outcome: Progressing 05/21/2023 1910 by Samuella Cota, RN Outcome: Progressing Goal: Respiratory complications will improve 05/21/2023 1910 by Samuella Cota, RN Outcome: Progressing 05/21/2023 1910 by Samuella Cota, RN Outcome: Progressing Goal: Cardiovascular complication will be avoided 05/21/2023 1910 by Samuella Cota, RN Outcome: Progressing 05/21/2023 1910 by Samuella Cota, RN Outcome: Progressing   Problem: Activity: Goal: Risk for activity intolerance will decrease 05/21/2023 1910 by Samuella Cota, RN Outcome: Progressing 05/21/2023 1910 by Samuella Cota, RN Outcome: Progressing   Problem: Nutrition: Goal: Adequate nutrition will be maintained 05/21/2023 1910 by Samuella Cota, RN Outcome: Progressing 05/21/2023 1910 by Samuella Cota, RN Outcome: Progressing   Problem: Coping: Goal: Level of anxiety will decrease 05/21/2023 1910 by Samuella Cota, RN Outcome: Progressing 05/21/2023 1910 by Samuella Cota, RN Outcome: Progressing   Problem: Elimination: Goal: Will not experience complications related to bowel motility 05/21/2023 1910 by Samuella Cota, RN Outcome: Progressing 05/21/2023 1910 by Samuella Cota, RN Outcome: Progressing Goal: Will not experience complications related to urinary retention 05/21/2023 1910 by Samuella Cota, RN Outcome: Progressing 05/21/2023 1910 by Samuella Cota, RN Outcome: Progressing   Problem: Pain Managment: Goal: General experience of comfort will improve 05/21/2023 1910 by Samuella Cota, RN Outcome: Progressing 05/21/2023 1910 by Samuella Cota, RN Outcome: Progressing   Problem: Safety: Goal: Ability to remain free from injury will improve 05/21/2023 1910 by Samuella Cota, RN Outcome: Progressing 05/21/2023 1910 by Samuella Cota, RN Outcome: Progressing   Problem: Skin Integrity: Goal: Risk for impaired skin integrity will decrease 05/21/2023 1910 by Samuella Cota, RN Outcome: Progressing 05/21/2023 1910 by Samuella Cota, RN Outcome: Progressing   Problem: Education: Goal: Knowledge of the prescribed therapeutic regimen will improve 05/21/2023 1910 by Samuella Cota, RN Outcome: Progressing 05/21/2023 1910 by Samuella Cota, RN Outcome: Progressing Goal: Understanding of sexual limitations or changes related to disease process or condition will improve 05/21/2023 1910 by Samuella Cota, RN Outcome: Progressing 05/21/2023 1910 by Samuella Cota, RN Outcome: Progressing Goal: Individualized Educational Video(s) 05/21/2023 1910 by Samuella Cota, RN Outcome: Progressing 05/21/2023 1910 by Samuella Cota, RN Outcome: Progressing   Problem: Self-Concept: Goal: Communication of feelings regarding changes in body function or appearance will improve 05/21/2023 1910 by Samuella Cota, RN Outcome: Progressing 05/21/2023 1910 by Samuella Cota, RN Outcome: Progressing   Problem: Skin Integrity: Goal: Demonstration of wound healing without infection  will improve 05/21/2023 1910 by Samuella Cota, RN Outcome: Progressing 05/21/2023 1910 by Samuella Cota, RN Outcome: Progressing   Problem: Education: Goal: Ability to describe self-care measures  that may prevent or decrease complications (Diabetes Survival Skills Education) will improve 05/21/2023 1910 by Samuella Cota, RN Outcome: Progressing 05/21/2023 1910 by Samuella Cota, RN Outcome: Progressing Goal: Individualized Educational Video(s) 05/21/2023 1910 by Samuella Cota, RN Outcome: Progressing 05/21/2023 1910 by Samuella Cota, RN Outcome: Progressing   Problem: Coping: Goal: Ability to adjust to condition or change in health will improve 05/21/2023 1910 by Samuella Cota, RN Outcome: Progressing 05/21/2023 1910 by Samuella Cota, RN Outcome: Progressing   Problem: Fluid Volume: Goal: Ability to maintain a balanced intake and output will improve 05/21/2023 1910 by Samuella Cota, RN Outcome: Progressing 05/21/2023 1910 by Samuella Cota, RN Outcome: Progressing   Problem: Health Behavior/Discharge Planning: Goal: Ability to identify and utilize available resources and services will improve 05/21/2023 1910 by Samuella Cota, RN Outcome: Progressing 05/21/2023 1910 by Samuella Cota, RN Outcome: Progressing Goal: Ability to manage health-related needs will improve 05/21/2023 1910 by Samuella Cota, RN Outcome: Progressing 05/21/2023 1910 by Samuella Cota, RN Outcome: Progressing   Problem: Metabolic: Goal: Ability to maintain appropriate glucose levels will improve 05/21/2023 1910 by Samuella Cota, RN Outcome: Progressing 05/21/2023 1910 by Samuella Cota, RN Outcome: Progressing   Problem: Nutritional: Goal: Maintenance of adequate nutrition will improve 05/21/2023 1910 by Samuella Cota, RN Outcome: Progressing 05/21/2023 1910 by Samuella Cota, RN Outcome: Progressing Goal: Progress toward achieving an optimal weight will improve 05/21/2023 1910 by Samuella Cota, RN Outcome:  Progressing 05/21/2023 1910 by Samuella Cota, RN Outcome: Progressing   Problem: Skin Integrity: Goal: Risk for impaired skin integrity will decrease 05/21/2023 1910 by Samuella Cota, RN Outcome: Progressing 05/21/2023 1910 by Samuella Cota, RN Outcome: Progressing   Problem: Tissue Perfusion: Goal: Adequacy of tissue perfusion will improve 05/21/2023 1910 by Samuella Cota, RN Outcome: Progressing 05/21/2023 1910 by Samuella Cota, RN Outcome: Progressing

## 2023-05-21 NOTE — Progress Notes (Signed)
This nurse is cosigning that 27mL of Dilaudid PCA syringe was wasted in [employer] medication waste disposal container with Ledon Snare, RN patient's primary nurse on July 21st 2024 at 0047.

## 2023-05-21 NOTE — Progress Notes (Signed)
Called to room by NT. Patient O2 level 89%. HOB elevated and incentive spirometer encouraged. Throughout intervention O2 trend positive. O2 100% post intervention. Patient states no chest pain or difficulty breathing.

## 2023-05-21 NOTE — Progress Notes (Signed)
Per [facility] pharmacy, wasted 27mL dilaudid PCA syringe in 1S OB [place] Pyxis on 05/20/22 @0047 . Witnessed by Early Chars, RN.

## 2023-05-21 NOTE — Plan of Care (Signed)
  Problem: Education: Goal: Knowledge of General Education information will improve Description: Including pain rating scale, medication(s)/side effects and non-pharmacologic comfort measures Outcome: Progressing   Problem: Health Behavior/Discharge Planning: Goal: Ability to manage health-related needs will improve Outcome: Progressing   Problem: Clinical Measurements: Goal: Ability to maintain clinical measurements within normal limits will improve Outcome: Progressing Goal: Will remain free from infection Outcome: Progressing Goal: Diagnostic test results will improve Outcome: Progressing Goal: Respiratory complications will improve Outcome: Progressing Goal: Cardiovascular complication will be avoided Outcome: Progressing   Problem: Activity: Goal: Risk for activity intolerance will decrease Outcome: Progressing   Problem: Nutrition: Goal: Adequate nutrition will be maintained Outcome: Progressing   Problem: Coping: Goal: Level of anxiety will decrease Outcome: Progressing   Problem: Elimination: Goal: Will not experience complications related to bowel motility Outcome: Progressing Goal: Will not experience complications related to urinary retention Outcome: Progressing   Problem: Pain Managment: Goal: General experience of comfort will improve Outcome: Progressing   Problem: Safety: Goal: Ability to remain free from injury will improve Outcome: Progressing   Problem: Skin Integrity: Goal: Risk for impaired skin integrity will decrease Outcome: Progressing   Problem: Education: Goal: Knowledge of the prescribed therapeutic regimen will improve Outcome: Progressing Goal: Understanding of sexual limitations or changes related to disease process or condition will improve Outcome: Progressing Goal: Individualized Educational Video(s) Outcome: Progressing   Problem: Self-Concept: Goal: Communication of feelings regarding changes in body function or  appearance will improve Outcome: Progressing   Problem: Skin Integrity: Goal: Demonstration of wound healing without infection will improve Outcome: Progressing   Problem: Education: Goal: Ability to describe self-care measures that may prevent or decrease complications (Diabetes Survival Skills Education) will improve Outcome: Progressing Goal: Individualized Educational Video(s) Outcome: Progressing   Problem: Coping: Goal: Ability to adjust to condition or change in health will improve Outcome: Progressing   Problem: Fluid Volume: Goal: Ability to maintain a balanced intake and output will improve Outcome: Progressing   Problem: Health Behavior/Discharge Planning: Goal: Ability to identify and utilize available resources and services will improve Outcome: Progressing Goal: Ability to manage health-related needs will improve Outcome: Progressing   Problem: Metabolic: Goal: Ability to maintain appropriate glucose levels will improve Outcome: Progressing   Problem: Nutritional: Goal: Maintenance of adequate nutrition will improve Outcome: Progressing Goal: Progress toward achieving an optimal weight will improve Outcome: Progressing   Problem: Skin Integrity: Goal: Risk for impaired skin integrity will decrease Outcome: Progressing   Problem: Tissue Perfusion: Goal: Adequacy of tissue perfusion will improve Outcome: Progressing

## 2023-05-22 LAB — GLUCOSE, CAPILLARY
Glucose-Capillary: 131 mg/dL — ABNORMAL HIGH (ref 70–99)
Glucose-Capillary: 150 mg/dL — ABNORMAL HIGH (ref 70–99)
Glucose-Capillary: 157 mg/dL — ABNORMAL HIGH (ref 70–99)
Glucose-Capillary: 162 mg/dL — ABNORMAL HIGH (ref 70–99)
Glucose-Capillary: 166 mg/dL — ABNORMAL HIGH (ref 70–99)
Glucose-Capillary: 168 mg/dL — ABNORMAL HIGH (ref 70–99)
Glucose-Capillary: 221 mg/dL — ABNORMAL HIGH (ref 70–99)

## 2023-05-22 MED ORDER — GABAPENTIN 300 MG PO CAPS
300.0000 mg | ORAL_CAPSULE | Freq: Three times a day (TID) | ORAL | Status: DC
  Administered 2023-05-22 – 2023-05-25 (×10): 300 mg via ORAL
  Filled 2023-05-22 (×10): qty 1

## 2023-05-22 MED ORDER — ONDANSETRON 4 MG PO TBDP
8.0000 mg | ORAL_TABLET | Freq: Three times a day (TID) | ORAL | Status: DC | PRN
  Administered 2023-05-22 – 2023-05-25 (×4): 8 mg via ORAL
  Filled 2023-05-22 (×4): qty 2

## 2023-05-22 MED ORDER — CHEWING GUM (ORBIT) SUGAR FREE
1.0000 | CHEWING_GUM | Freq: Four times a day (QID) | ORAL | Status: DC
  Administered 2023-05-22 (×3): 1 via ORAL
  Filled 2023-05-22: qty 1

## 2023-05-22 MED ORDER — KETOROLAC TROMETHAMINE 30 MG/ML IJ SOLN
30.0000 mg | Freq: Three times a day (TID) | INTRAMUSCULAR | Status: AC
  Administered 2023-05-22 – 2023-05-23 (×5): 30 mg via INTRAVENOUS
  Filled 2023-05-22 (×5): qty 1

## 2023-05-22 NOTE — Progress Notes (Signed)
Patient ID: Maria [name], female   DOB: Dec 09, 1980, 42 y.o.   MRN: [id number] 2 Days Post-Op Procedure(s) (LRB): LAPAROSCOPY DIAGNOSTIC (N/A) EXPLORATORY LAPAROTOMY WITH LYSIS OF ADHESIONS, SEROSA REPAIR, EVACUATION OF HEMMORHAGIC OVARIAN CYST  Maria [name] is a 42 y.o. female patient. S/P TAH + bilateral salpingectomy +lysis of adhesions(LOA) 2016    Past Medical History:  Diagnosis Date   Acid reflux    Chronic kidney disease    kidney stones    Diabetes mellitus without complication (HCC)    Dysmenorrhea    Fibroids    Headache    Kidney stone     No past surgical history pertinent negatives on file.  Scheduled Meds:  atorvastatin  10 mg Oral q1800   chewing gum (ORBIT) sugar free  1 Stick Oral QID   docusate sodium  100 mg Oral BID   enoxaparin (LOVENOX) injection  40 mg Subcutaneous Q24H   escitalopram  20 mg Oral QHS   gabapentin  300 mg Oral Q8H   insulin aspart  0-9 Units Subcutaneous Q4H   ketorolac  30 mg Intravenous Q8H   pantoprazole  40 mg Oral Daily    Continuous Infusions:  lactated ringers Stopped (05/20/23 1137)   lactated ringers      PRN Meds:albuterol, fluticasone, hydrOXYzine, metoCLOPramide, ondansetron, oxyCODONE, oxyCODONE, phenol, simethicone  No Known Allergies  Principal Problem:   Pelvic pain in female   Subjective   Pt states she feels better now than before her surgery, she is feeling "good" right at the moment I have added ATC IV Toradol and gabapentin and she likes trying to decreae her naroctic usage She has ambulated No significant flattus She is having some drainage from her incision, appears benign, likely liquified sq fat with the T'd incision  Objective   Vitals:   05/22/23 0022 05/22/23 0025 05/22/23 0412 05/22/23 0801  BP: 132/77  125/75   Pulse: (!) 114  (!) 103   Resp: 18  18 18   Temp: 98.2 F (36.8 C)  98.2 F (36.8 C) 98.4 F (36.9 C)  TempSrc: Oral  Oral Oral  SpO2: 93% 92% 94% 97%  Weight:       Height:       Vitals:   05/20/23 1530 05/20/23 1609 05/20/23 2001 05/21/23 0000  BP: 130/84 119/80 (!) 140/87 (!) 143/84   05/21/23 0350 05/21/23 0804 05/21/23 0849 05/21/23 1335  BP: 128/63 (!) 151/101 129/76 125/72   05/21/23 1700 05/21/23 1957 05/22/23 0022 05/22/23 0412  BP: (!) 146/82 (!) 136/93 132/77 125/75     Subjective Objective: Vital signs (most recent): Blood pressure 125/75, pulse (!) 103, temperature 98.4 F (36.9 C), temperature source Oral, resp. rate 18, height 4' 9.01" (1.448 m), weight 72.6 kg, last menstrual period 05/12/2015, SpO2 97%.   Gen  WDWN NAD Abdomen  soft +BS normal no rushes or tinkles noted no tympanic sounds, some distention Incision  clean dry intact, dressing to be changes     Latest Ref Rng & Units 05/21/2023    4:46 AM 05/20/2023   10:08 PM 05/20/2023    2:45 AM  CBC  WBC 4.0 - 10.5 K/uL 16.7  16.0  8.3   Hemoglobin 12.0 - 15.0 g/dL 40.9  81.1  91.4   Hematocrit 36.0 - 46.0 % 40.2  38.9  41.6   Platelets 150 - 400 K/uL 222  216  187        Latest Ref Rng & Units 05/21/2023    4:46  AM 05/19/2023    4:20 AM 05/19/2023    4:09 AM  CMP  Glucose 70 - 99 mg/dL 829  562  130   BUN 6 - 20 mg/dL 10  15  14    Creatinine 0.44 - 1.00 mg/dL 8.65  7.84  6.96   Sodium 135 - 145 mmol/L 134  135  136   Potassium 3.5 - 5.1 mmol/L 4.1  4.1  4.1   Chloride 98 - 111 mmol/L 101  99  99   CO2 22 - 32 mmol/L 27  26    Calcium 8.9 - 10.3 mg/dL 8.8  9.4    Total Protein 6.5 - 8.1 g/dL 6.6  7.3    Total Bilirubin 0.3 - 1.2 mg/dL 0.3  0.2    Alkaline Phos 38 - 126 U/L 52  65    AST 15 - 41 U/L 39  25    ALT 0 - 44 U/L 23  23       Assessment & Plan POD#2 explap with LOA:  Continue current care plan with pain management, ^ ambulation Will advance diet tomorrow Watch WBC  Lazaro Arms, MD 05/22/2023, 11:29 AM

## 2023-05-23 LAB — GLUCOSE, CAPILLARY
Glucose-Capillary: 141 mg/dL — ABNORMAL HIGH (ref 70–99)
Glucose-Capillary: 144 mg/dL — ABNORMAL HIGH (ref 70–99)
Glucose-Capillary: 141 mg/dL — ABNORMAL HIGH (ref 70–99)
Glucose-Capillary: 174 mg/dL — ABNORMAL HIGH (ref 70–99)

## 2023-05-23 MED ORDER — GLYBURIDE 2.5 MG PO TABS
2.5000 mg | ORAL_TABLET | Freq: Two times a day (BID) | ORAL | Status: DC
  Administered 2023-05-23 – 2023-05-24 (×2): 2.5 mg via ORAL
  Filled 2023-05-23 (×3): qty 1

## 2023-05-23 MED ORDER — MAGNESIUM SULFATE 50 % IJ SOLN: 2.0000 g | Freq: Once | INTRAMUSCULAR | Status: DC

## 2023-05-23 MED ORDER — LACTATED RINGERS IV BOLUS
1000.0000 mL | Freq: Once | INTRAVENOUS | Status: AC
  Administered 2023-05-23: 1000 mL via INTRAVENOUS

## 2023-05-23 MED ORDER — METOCLOPRAMIDE HCL 5 MG/ML IJ SOLN
10.0000 mg | Freq: Three times a day (TID) | INTRAMUSCULAR | Status: DC
  Administered 2023-05-23 – 2023-05-24 (×2): 10 mg via INTRAVENOUS
  Filled 2023-05-23 (×2): qty 2

## 2023-05-23 MED ORDER — MAGNESIUM SULFATE 2 GM/50ML IV SOLN
2.0000 g | Freq: Once | INTRAVENOUS | Status: AC
  Administered 2023-05-23: 2 g via INTRAVENOUS
  Filled 2023-05-23: qty 50

## 2023-05-23 MED ORDER — KCL-LACTATED RINGERS-D5W 20 MEQ/L IV SOLN
INTRAVENOUS | Status: DC
  Filled 2023-05-23 (×5): qty 1000

## 2023-05-23 NOTE — Progress Notes (Signed)
Patient ID: Maria [name], female   DOB: 08-Aug-1981, 42 y.o.   MRN: [id number] 3 Days Post-Op Procedure(s) (LRB): LAPAROSCOPY DIAGNOSTIC (N/A) EXPLORATORY LAPAROTOMY WITH LYSIS OF ADHESIONS, SEROSA REPAIR, EVACUATION OF HEMMORHAGIC OVARIAN CYST  Maria [name] is a 42 y.o. female patient.  Threw up once yesterday and this am Passing gas, +BM Eating and drinking Pain well managed   Past Medical History:  Diagnosis Date   Acid reflux    Chronic kidney disease    kidney stones    Diabetes mellitus without complication (HCC)    Dysmenorrhea    Fibroids    Headache    Kidney stone     No past surgical history pertinent negatives on file.  Scheduled Meds:  atorvastatin  10 mg Oral q1800   chewing gum (ORBIT) sugar free  1 Stick Oral QID   docusate sodium  100 mg Oral BID   enoxaparin (LOVENOX) injection  40 mg Subcutaneous Q24H   escitalopram  20 mg Oral QHS   gabapentin  300 mg Oral Q8H   glyBURIDE  2.5 mg Oral BID WC   insulin aspart  0-9 Units Subcutaneous Q4H   ketorolac  30 mg Intravenous Q8H   pantoprazole  40 mg Oral Daily    Continuous Infusions:  lactated ringers Stopped (05/20/23 1137)   lactated ringers      PRN Meds:albuterol, fluticasone, hydrOXYzine, metoCLOPramide, ondansetron, oxyCODONE, oxyCODONE, phenol, simethicone  No Known Allergies  Principal Problem:   Pelvic pain in female      Objective   Vitals:   05/22/23 1624 05/22/23 1959 05/23/23 0748 05/23/23 0803  BP:  135/73 (!) 167/94 (!) 154/90  Pulse:  91 92 84  Resp: 17 18 20    Temp: 98.2 F (36.8 C) 98.4 F (36.9 C) 98.3 F (36.8 C)   TempSrc: Oral Oral Oral   SpO2: 98% 99% 100% 100%  Weight:      Height:       Vitals:   05/21/23 0849 05/21/23 1335 05/21/23 1700 05/21/23 1957  BP: 129/76 125/72 (!) 146/82 (!) 136/93   05/22/23 0022 05/22/23 0412 05/22/23 0758 05/22/23 1149  BP: 132/77 125/75 (!) 148/78 137/76   05/22/23 1623 05/22/23 1959 05/23/23 0748 05/23/23 0803  BP:  128/72 135/73 (!) 167/94 (!) 154/90     Subjective Objective: Vital signs (most recent): Blood pressure (!) 154/90, pulse 84, temperature 98.3 F (36.8 C), temperature source Oral, resp. rate 20, height 4' 9.01" (1.448 m), weight 72.6 kg, last menstrual period 05/12/2015, SpO2 100%.   Gen  WDWN NAD Abdomen  soft mild distention, dressing just changed, some serous drainage likely seroma type fluid with the T incision Incision  clean intact     Latest Ref Rng & Units 05/21/2023    4:46 AM 05/20/2023   10:08 PM 05/20/2023    2:45 AM  CBC  WBC 4.0 - 10.5 K/uL 16.7  16.0  8.3   Hemoglobin 12.0 - 15.0 g/dL 95.2  84.1  32.4   Hematocrit 36.0 - 46.0 % 40.2  38.9  41.6   Platelets 150 - 400 K/uL 222  216  187        Latest Ref Rng & Units 05/21/2023    4:46 AM 05/19/2023    4:20 AM 05/19/2023    4:09 AM  CMP  Glucose 70 - 99 mg/dL 401  027  253   BUN 6 - 20 mg/dL 10  15  14    Creatinine 0.44 - 1.00 mg/dL  0.94  0.87  0.90   Sodium 135 - 145 mmol/L 134  135  136   Potassium 3.5 - 5.1 mmol/L 4.1  4.1  4.1   Chloride 98 - 111 mmol/L 101  99  99   CO2 22 - 32 mmol/L 27  26    Calcium 8.9 - 10.3 mg/dL 8.8  9.4    Total Protein 6.5 - 8.1 g/dL 6.6  7.3    Total Bilirubin 0.3 - 1.2 mg/dL 0.3  0.2    Alkaline Phos 38 - 126 U/L 52  65    AST 15 - 41 U/L 39  25    ALT 0 - 44 U/L 23  23       Current Facility-Administered Medications:    albuterol (PROVENTIL) (2.5 MG/3ML) 0.083% nebulizer solution 2.5 mg, 2.5 mg, Inhalation, Q6H PRN, Warden Fillers, MD   atorvastatin (LIPITOR) tablet 10 mg, 10 mg, Oral, q1800, Warden Fillers, MD, 10 mg at 05/22/23 1901   chewing gum (ORBIT) sugar free, 1 Stick, Oral, QID, Morse Bing, MD, 1 Stick at 05/22/23 1900   docusate sodium (COLACE) capsule 100 mg, 100 mg, Oral, BID, Warden Fillers, MD, 100 mg at 05/22/23 2124   enoxaparin (LOVENOX) injection 40 mg, 40 mg, Subcutaneous, Q24H, Warden Fillers, MD, 40 mg at 05/22/23 1638   escitalopram  (LEXAPRO) tablet 20 mg, 20 mg, Oral, QHS, Warden Fillers, MD, 20 mg at 05/22/23 2124   fluticasone (FLONASE) 50 MCG/ACT nasal spray 1 spray, 1 spray, Each Nare, Daily PRN, Warden Fillers, MD   gabapentin (NEURONTIN) capsule 300 mg, 300 mg, Oral, Q8H, Lazaro Arms, MD, 300 mg at 05/23/23 1019   glyBURIDE (DIABETA) tablet 2.5 mg, 2.5 mg, Oral, BID WC, Lazaro Arms, MD   hydrOXYzine (ATARAX) tablet 25 mg, 25 mg, Oral, Q8H PRN, Warden Fillers, MD, 25 mg at 05/21/23 2134   insulin aspart (novoLOG) injection 0-9 Units, 0-9 Units, Subcutaneous, Q4H, Warden Fillers, MD, 1 Units at 05/23/23 0810   ketorolac (TORADOL) 30 MG/ML injection 30 mg, 30 mg, Intravenous, Q8H, Lazaro Arms, MD, 30 mg at 05/23/23 0930   lactated ringers infusion, , Intravenous, Continuous, Warden Fillers, MD, Stopped at 05/20/23 1137   lactated ringers infusion, , Intravenous, Continuous, Warden Fillers, MD   metoCLOPramide (REGLAN) tablet 10 mg, 10 mg, Oral, Q6H PRN, Garland Bing, MD, 10 mg at 05/23/23 0138   ondansetron (ZOFRAN-ODT) disintegrating tablet 8 mg, 8 mg, Oral, Q8H PRN, Lazaro Arms, MD, 8 mg at 05/23/23 0931   oxyCODONE (Oxy IR/ROXICODONE) immediate release tablet 10 mg, 10 mg, Oral, Q6H PRN, Conan Bowens, MD, 10 mg at 05/21/23 2204   oxyCODONE (Oxy IR/ROXICODONE) immediate release tablet 5 mg, 5 mg, Oral, Q4H PRN, Warden Fillers, MD, 5 mg at 05/23/23 0930   pantoprazole (PROTONIX) EC tablet 40 mg, 40 mg, Oral, Daily, Warden Fillers, MD, 40 mg at 05/23/23 1019   phenol (CHLORASEPTIC) mouth spray 1 spray, 1 spray, Mouth/Throat, PRN, Conan Bowens, MD, 1 spray at 05/20/23 1705   simethicone (MYLICON) chewable tablet 80 mg, 80 mg, Oral, QID PRN, Warden Fillers, MD    Assessment & Plan  POD #4 exp lap with LOA, left ovarian cystectomy, probable mild post op ileus: Encouraged ambulation, chewing gum Advance diet since having lower bowel function Begin oral hypoglycemic, cannot tolerate  metformin, begin glyburide 2.5 mg BID Recheck labs in am   Maria [name]  Despina Hidden, MD 05/23/2023, 11:22 AM

## 2023-05-23 NOTE — Progress Notes (Signed)
Patient ID: Maria [name], female   DOB: 11/20/1980, 42 y.o.   MRN: [id number] Evening note: Has thrown up twice today +flattus + BM x 2  Likely adynamic upper ileus Will check lytes in am, give magnesium and reglan  Lazaro Arms, MD 05/23/2023 4:13 PM

## 2023-05-24 LAB — BASIC METABOLIC PANEL
Anion gap: 9 (ref 5–15)
BUN: 12 mg/dL (ref 6–20)
CO2: 26 mmol/L (ref 22–32)
Calcium: 8.1 mg/dL — ABNORMAL LOW (ref 8.9–10.3)
Chloride: 98 mmol/L (ref 98–111)
Creatinine, Ser: 0.86 mg/dL (ref 0.44–1.00)
GFR, Estimated: >60 mL/min (ref >60)
Glucose, Bld: 162 mg/dL — ABNORMAL HIGH (ref 70–99)
Potassium: 3.5 mmol/L (ref 3.5–5.1)
Sodium: 133 mmol/L — ABNORMAL LOW (ref 135–145)

## 2023-05-24 LAB — CBC
HCT: 34.4 % — ABNORMAL LOW (ref 36.0–46.0)
Hemoglobin: 11.6 g/dL — ABNORMAL LOW (ref 12.0–15.0)
MCH: 28.6 pg (ref 26.0–34.0)
MCHC: 33.7 g/dL (ref 30.0–36.0)
MCV: 84.9 fL (ref 80.0–100.0)
Platelets: 200 10*3/uL (ref 150–400)
RBC: 4.05 MIL/uL (ref 3.87–5.11)
RDW: 13.9 % (ref 11.5–15.5)
WBC: 4.8 10*3/uL (ref 4.0–10.5)
nRBC: 0 % (ref 0.0–0.2)

## 2023-05-24 LAB — GLUCOSE, CAPILLARY
Glucose-Capillary: 135 mg/dL — ABNORMAL HIGH (ref 70–99)
Glucose-Capillary: 149 mg/dL — ABNORMAL HIGH (ref 70–99)
Glucose-Capillary: 168 mg/dL — ABNORMAL HIGH (ref 70–99)
Glucose-Capillary: 173 mg/dL — ABNORMAL HIGH (ref 70–99)

## 2023-05-24 LAB — MAGNESIUM: Magnesium: 2.1 mg/dL (ref 1.7–2.4)

## 2023-05-24 MED ORDER — GLYBURIDE 5 MG PO TABS
5.0000 mg | ORAL_TABLET | Freq: Two times a day (BID) | ORAL | Status: DC
  Administered 2023-05-24 – 2023-05-25 (×2): 5 mg via ORAL
  Filled 2023-05-24 (×3): qty 1

## 2023-05-24 MED ORDER — METOCLOPRAMIDE HCL 10 MG PO TABS
10.0000 mg | ORAL_TABLET | Freq: Three times a day (TID) | ORAL | Status: DC
  Administered 2023-05-24 – 2023-05-25 (×5): 10 mg via ORAL
  Filled 2023-05-24 (×5): qty 1

## 2023-05-24 NOTE — Inpatient Diabetes Management (Signed)
Inpatient Diabetes Program Recommendations  AACE/ADA: New Consensus Statement on Inpatient Glycemic Control   Target Ranges:  Prepandial:   less than 140 mg/dL      Peak postprandial:   less than 180 mg/dL (1-2 hours)      Critically ill patients:  140 - 180 mg/dL    Latest Reference Range & Units 05/23/23 04:03 05/23/23 07:52 05/23/23 17:09 05/23/23 20:03 05/24/23 00:17 05/24/23 04:02 05/24/23 08:02  Glucose-Capillary 70 - 99 mg/dL 409 (H) 811 (H) 914 (H) 174 (H) 173 (H) 149 (H) 168 (H)   Review of Glycemic Control  Diabetes history: DM2 Outpatient Diabetes medications: Trulicity 1.5 mg Qweek Current orders for Inpatient glycemic control: Glyburide 2.5 mg BID  Inpatient Diabetes Program Recommendations:    Insulin: Please consider discontinuing Glyburide and ordering CBGs AC&HS and Novolog 0-15 units AC&HS.  Thanks, Orlando Penner, RN, MSN, CDCES Diabetes Coordinator Inpatient Diabetes Program [phone] (Team Pager from 8am to 5pm)

## 2023-05-24 NOTE — Progress Notes (Signed)
Patient ID: Maria [name], female   DOB: May 25, 1981, 42 y.o.   MRN: [id number] 4 Days Post-Op Procedure(s) (LRB): LAPAROSCOPY DIAGNOSTIC (N/A) EXPLORATORY LAPAROTOMY WITH LYSIS OF ADHESIONS, SEROSA REPAIR, EVACUATION OF HEMMORHAGIC OVARIAN CYST  Maria [name] is a 42 y.o. female patient.   I have seen pt several times throughout the day but was not sure how she would land so I am writing note late because of late disposition decision  She is having normal lower bowel function with BM and flatus but sluggish upper function with nausea emesis x 2 today and some anorexia but overall she looks good with normal bowel sounds on exam no rushes tinkles or tympanic sounds  She is now walking outside and really moving well  Past Medical History:  Diagnosis Date   Acid reflux    Chronic kidney disease    kidney stones    Diabetes mellitus without complication (HCC)    Dysmenorrhea    Fibroids    Headache    Kidney stone     No past surgical history pertinent negatives on file.  Scheduled Meds:  atorvastatin  10 mg Oral q1800   chewing gum (ORBIT) sugar free  1 Stick Oral QID   docusate sodium  100 mg Oral BID   enoxaparin (LOVENOX) injection  40 mg Subcutaneous Q24H   escitalopram  20 mg Oral QHS   gabapentin  300 mg Oral Q8H   glyBURIDE  5 mg Oral BID WC   metoCLOPramide  10 mg Oral Q8H   pantoprazole  40 mg Oral Daily    Continuous Infusions:  dextrose 5% lactated ringers with KCl 20 mEq/L 125 mL/hr at 05/24/23 1105   lactated ringers Stopped (05/20/23 1137)   lactated ringers      PRN Meds:albuterol, fluticasone, hydrOXYzine, ondansetron, oxyCODONE, oxyCODONE, phenol, simethicone  No Known Allergies  Principal Problem:   Pelvic pain in female      Objective   Vitals:   05/24/23 0402 05/24/23 0730 05/24/23 1205 05/24/23 1528  BP: 139/75 127/75 (!) 148/92 (!) 141/80  Pulse: 79 73 77 79  Resp: 16 14 14 13   Temp: 98.4 F (36.9 C) 98.1 F (36.7 C) 98.1 F (36.7  C) 98.6 F (37 C)  TempSrc: Oral Oral Oral Oral  SpO2: 96% 96% 99% 100%  Weight:      Height:       Vitals:   05/22/23 0758 05/22/23 1149 05/22/23 1623 05/22/23 1959  BP: (!) 148/78 137/76 128/72 135/73   05/23/23 0748 05/23/23 0803 05/23/23 1451 05/23/23 2006  BP: (!) 167/94 (!) 154/90 (!) 135/90 117/75   05/24/23 0402 05/24/23 0730 05/24/23 1205 05/24/23 1528  BP: 139/75 127/75 (!) 148/92 (!) 141/80     Subjective Objective: Vital signs (most recent): Blood pressure (!) 141/80, pulse 79, temperature 98.6 F (37 C), temperature source Oral, resp. rate 13, height 4' 9.01" (1.448 m), weight 72.6 kg, last menstrual period 05/12/2015, SpO2 100%.  Gen WDWN NAD Abdomen  soft less distended appropriate exam some Liquifed fat drainage lessening as above normal bowel sounds   Incision  T incision looks good     Latest Ref Rng & Units 05/24/2023    5:01 AM 05/21/2023    4:46 AM 05/20/2023   10:08 PM  CBC  WBC 4.0 - 10.5 K/uL 4.8  16.7  16.0   Hemoglobin 12.0 - 15.0 g/dL 27.2  53.6  64.4   Hematocrit 36.0 - 46.0 % 34.4  40.2  38.9  Platelets 150 - 400 K/uL 200  222  216        Latest Ref Rng & Units 05/24/2023    5:01 AM 05/21/2023    4:46 AM 05/19/2023    4:20 AM  CMP  Glucose 70 - 99 mg/dL 161  096  045   BUN 6 - 20 mg/dL 12  10  15    Creatinine 0.44 - 1.00 mg/dL 4.09  8.11  9.14   Sodium 135 - 145 mmol/L 133  134  135   Potassium 3.5 - 5.1 mmol/L 3.5  4.1  4.1   Chloride 98 - 111 mmol/L 98  101  99   CO2 22 - 32 mmol/L 26  27  26    Calcium 8.9 - 10.3 mg/dL 8.1  8.8  9.4   Total Protein 6.5 - 8.1 g/dL  6.6  7.3   Total Bilirubin 0.3 - 1.2 mg/dL  0.3  0.2   Alkaline Phos 38 - 126 U/L  52  65   AST 15 - 41 U/L  39  25   ALT 0 - 44 U/L  23  23      Assessment & Plan POD#4 exp lap with extensive lysis of adhesions, running of the bowel, removal of corpus luteum cyst  Probable mild ileus but good lower tract function,  Ralph Dowdy liquids She was close to being  able to go home today but I think she needs another day of inhouse fluids and care given her extensive adhesions  Don't think abd films would be of benefit, she is improving just slowly no evidence of an obstruction picture  Lazaro Arms, MD 05/24/2023, 4:46 PM

## 2023-05-25 ENCOUNTER — Other Ambulatory Visit (HOSPITAL_COMMUNITY): Payer: Self-pay

## 2023-05-25 DIAGNOSIS — R102 Pelvic and perineal pain: Principal | ICD-10-CM

## 2023-05-25 LAB — GLUCOSE, CAPILLARY
Glucose-Capillary: 116 mg/dL — ABNORMAL HIGH (ref 70–99)
Glucose-Capillary: 78 mg/dL (ref 70–99)

## 2023-05-25 MED ORDER — OXYCODONE HCL 5 MG PO TABS
5.0000 mg | ORAL_TABLET | Freq: Four times a day (QID) | ORAL | 0 refills | Status: AC | PRN
Start: 2023-05-25 — End: ?
  Filled 2023-05-25: qty 28, 7d supply, fill #0

## 2023-05-25 MED ORDER — IBUPROFEN 600 MG PO TABS
600.0000 mg | ORAL_TABLET | Freq: Four times a day (QID) | ORAL | 1 refills | Status: DC | PRN
  Filled 2023-05-25: qty 60, 15d supply, fill #0

## 2023-05-25 MED ORDER — ONDANSETRON 4 MG PO TBDP
4.0000 mg | ORAL_TABLET | Freq: Three times a day (TID) | ORAL | 0 refills | Status: DC | PRN
Start: 2023-05-25 — End: 2023-06-09
  Filled 2023-05-25: qty 18, 6d supply, fill #0

## 2023-05-25 MED ORDER — GLYBURIDE 5 MG PO TABS
5.0000 mg | ORAL_TABLET | Freq: Two times a day (BID) | ORAL | 1 refills | Status: DC
  Filled 2023-05-25: qty 30, 15d supply, fill #0

## 2023-05-25 NOTE — Progress Notes (Signed)
Patient given discharge instructions and denies any questions or concerns. Patient ambulatory off unit at this time. 

## 2023-05-25 NOTE — Discharge Summary (Signed)
Physician Discharge Summary  Patient ID: DAILEE MANALANG MRN: [id number] DOB/AGE: Aug 28, 1981 42 y.o.  Admit date: 05/19/2023 Discharge date: 05/25/2023  Admission Diagnoses:Pelvic pain  Discharge Diagnoses:  Principal Problem:   Pelvic pain in female   Discharged Condition: good  Hospital Course: Ms Copus was admitted for pelvic pain thought to be related to possible ovarian torsion. See admit H & P for additional information. She underwent Ex lap with LOA by Gyn and General surgery. See OP notes for additional information. Post op course was unremarkable. She progressed to ambulating, tolerating diet, good oral pain control, + flatus and BM. Chronic medical problems were managed with restarting of home meds and adjusted as needed. On POD # 5, felt pt was amendable for discharge home. Discharge instructions, medications and follow up were reviewed with pt. She verbalized understanding.  Consults: general surgery  Significant Diagnostic Studies: labs  Treatments: surgery: See OP notes  Discharge Exam: Blood pressure 123/76, pulse 78, temperature 98.3 F (36.8 C), temperature source Oral, resp. rate 17, height 4' 9.01" (1.448 m), weight 72.6 kg, last menstrual period 05/12/2015, SpO2 99%. Lungs clear  Heart RRR Abd soft, + BS, incision CDI Ext non tender  Disposition: Discharge disposition: 01-Home or Self Care       Discharge Instructions     Call MD for:  difficulty breathing, headache or visual disturbances   Complete by: As directed    Call MD for:  extreme fatigue   Complete by: As directed    Call MD for:  hives   Complete by: As directed    Call MD for:  persistant dizziness or light-headedness   Complete by: As directed    Call MD for:  persistant nausea and vomiting   Complete by: As directed    Call MD for:  redness, tenderness, or signs of infection (pain, swelling, redness, odor or green/yellow discharge around incision site)   Complete by: As  directed    Call MD for:  severe uncontrolled pain   Complete by: As directed    Call MD for:  temperature >100.4   Complete by: As directed    Diet - low sodium heart healthy   Complete by: As directed    Driving Restrictions   Complete by: As directed    No driving x 2 weeks   Increase activity slowly   Complete by: As directed    Sexual Activity Restrictions   Complete by: As directed    Pelvic rest x 4 weeks      Allergies as of 05/25/2023   No Known Allergies      Medication List     STOP taking these medications    nortriptyline 25 MG capsule Commonly known as: PAMELOR   ondansetron 4 MG disintegrating tablet Commonly known as: Zofran ODT   pantoprazole 20 MG tablet Commonly known as: PROTONIX   SUMAtriptan 100 MG tablet Commonly known as: IMITREX       TAKE these medications    albuterol 108 (90 Base) MCG/ACT inhaler Commonly known as: VENTOLIN HFA Inhale 1-2 puffs into the lungs every 6 (six) hours as needed for wheezing or shortness of breath.   atorvastatin 10 MG tablet Commonly known as: LIPITOR Take 10 mg by mouth daily.   escitalopram 20 MG tablet Commonly known as: LEXAPRO Take 20 mg by mouth daily.   fluticasone 50 MCG/ACT nasal spray Commonly known as: FLONASE Place 1 spray into both nostrils as needed for allergies.   glyBURIDE 5  MG tablet Commonly known as: DIABETA Take 1 tablet (5 mg total) by mouth 2 (two) times daily with a meal.   hydrOXYzine 25 MG tablet Commonly known as: ATARAX Take 25 mg by mouth every 8 (eight) hours as needed (sleep).   oxyCODONE 5 MG immediate release tablet Commonly known as: Oxy IR/ROXICODONE Take 1 tablet (5 mg total) by mouth every 6 (six) hours as needed for moderate pain.   Trulicity 1.5 MG/0.5ML Sopn Generic drug: Dulaglutide Inject 1.5 mg into the skin once a week. Sundays        Follow-up Information     [facility] Follow up.    Specialty: Obstetrics and Gynecology Contact information: [address] [place] [address] [phone]                Signed: Hermina Staggers 05/25/2023, 10:11 AM

## 2023-05-27 ENCOUNTER — Inpatient Hospital Stay (HOSPITAL_COMMUNITY): Payer: 59 | Admitting: Anesthesiology

## 2023-05-27 ENCOUNTER — Inpatient Hospital Stay (HOSPITAL_COMMUNITY): Payer: 59

## 2023-05-27 ENCOUNTER — Inpatient Hospital Stay (HOSPITAL_COMMUNITY)
Admission: AD | Admit: 2023-05-27 | Discharge: 2023-06-09 | DRG: 907 | Disposition: A | Discharge status: Home Health | Payer: 59 | Attending: Internal Medicine | Admitting: Internal Medicine

## 2023-05-27 ENCOUNTER — Other Ambulatory Visit: Payer: Self-pay

## 2023-05-27 ENCOUNTER — Encounter (HOSPITAL_COMMUNITY): Payer: Self-pay | Admitting: Pulmonary Disease

## 2023-05-27 ENCOUNTER — Encounter (HOSPITAL_COMMUNITY)
Admission: AD | Disposition: A | Discharge status: Home Health | Payer: Self-pay | Source: Home / Self Care | Attending: Family Medicine

## 2023-05-27 DIAGNOSIS — J449 Chronic obstructive pulmonary disease, unspecified: Secondary | ICD-10-CM | POA: Diagnosis present

## 2023-05-27 DIAGNOSIS — K651 Peritoneal abscess: Secondary | ICD-10-CM | POA: Diagnosis not present

## 2023-05-27 DIAGNOSIS — I10 Essential (primary) hypertension: Secondary | ICD-10-CM | POA: Diagnosis not present

## 2023-05-27 DIAGNOSIS — Z8249 Family history of ischemic heart disease and other diseases of the circulatory system: Secondary | ICD-10-CM | POA: Diagnosis not present

## 2023-05-27 DIAGNOSIS — Z7984 Long term (current) use of oral hypoglycemic drugs: Secondary | ICD-10-CM | POA: Diagnosis not present

## 2023-05-27 DIAGNOSIS — E440 Moderate protein-calorie malnutrition: Secondary | ICD-10-CM | POA: Diagnosis present

## 2023-05-27 DIAGNOSIS — F17210 Nicotine dependence, cigarettes, uncomplicated: Secondary | ICD-10-CM | POA: Diagnosis present

## 2023-05-27 DIAGNOSIS — K219 Gastro-esophageal reflux disease without esophagitis: Secondary | ICD-10-CM | POA: Diagnosis present

## 2023-05-27 DIAGNOSIS — E876 Hypokalemia: Secondary | ICD-10-CM | POA: Diagnosis present

## 2023-05-27 DIAGNOSIS — R410 Disorientation, unspecified: Secondary | ICD-10-CM

## 2023-05-27 DIAGNOSIS — Y92009 Unspecified place in unspecified non-institutional (private) residence as the place of occurrence of the external cause: Secondary | ICD-10-CM

## 2023-05-27 DIAGNOSIS — E162 Hypoglycemia, unspecified: Secondary | ICD-10-CM | POA: Diagnosis not present

## 2023-05-27 DIAGNOSIS — E11649 Type 2 diabetes mellitus with hypoglycemia without coma: Secondary | ICD-10-CM | POA: Diagnosis present

## 2023-05-27 DIAGNOSIS — J9601 Acute respiratory failure with hypoxia: Secondary | ICD-10-CM | POA: Diagnosis not present

## 2023-05-27 DIAGNOSIS — Y838 Other surgical procedures as the cause of abnormal reaction of the patient, or of later complication, without mention of misadventure at the time of the procedure: Secondary | ICD-10-CM | POA: Diagnosis not present

## 2023-05-27 DIAGNOSIS — K56609 Unspecified intestinal obstruction, unspecified as to partial versus complete obstruction: Secondary | ICD-10-CM

## 2023-05-27 DIAGNOSIS — Z87442 Personal history of urinary calculi: Secondary | ICD-10-CM

## 2023-05-27 DIAGNOSIS — K432 Incisional hernia without obstruction or gangrene: Secondary | ICD-10-CM | POA: Diagnosis not present

## 2023-05-27 DIAGNOSIS — R509 Fever, unspecified: Secondary | ICD-10-CM | POA: Diagnosis not present

## 2023-05-27 DIAGNOSIS — R159 Full incontinence of feces: Secondary | ICD-10-CM | POA: Diagnosis not present

## 2023-05-27 DIAGNOSIS — J9811 Atelectasis: Secondary | ICD-10-CM | POA: Diagnosis not present

## 2023-05-27 DIAGNOSIS — T8132XA Disruption of internal operation (surgical) wound, not elsewhere classified, initial encounter: Principal | ICD-10-CM | POA: Diagnosis present

## 2023-05-27 DIAGNOSIS — E119 Type 2 diabetes mellitus without complications: Secondary | ICD-10-CM

## 2023-05-27 DIAGNOSIS — F32A Depression, unspecified: Secondary | ICD-10-CM | POA: Diagnosis present

## 2023-05-27 DIAGNOSIS — T8143XA Infection following a procedure, organ and space surgical site, initial encounter: Secondary | ICD-10-CM | POA: Diagnosis not present

## 2023-05-27 DIAGNOSIS — N179 Acute kidney failure, unspecified: Secondary | ICD-10-CM | POA: Diagnosis not present

## 2023-05-27 DIAGNOSIS — D649 Anemia, unspecified: Secondary | ICD-10-CM | POA: Diagnosis not present

## 2023-05-27 DIAGNOSIS — R000 Tachycardia, unspecified: Secondary | ICD-10-CM | POA: Diagnosis present

## 2023-05-27 DIAGNOSIS — K567 Ileus, unspecified: Secondary | ICD-10-CM | POA: Diagnosis not present

## 2023-05-27 DIAGNOSIS — Z833 Family history of diabetes mellitus: Secondary | ICD-10-CM

## 2023-05-27 DIAGNOSIS — R34 Anuria and oliguria: Secondary | ICD-10-CM | POA: Diagnosis present

## 2023-05-27 DIAGNOSIS — Z6839 Body mass index (BMI) 39.0-39.9, adult: Secondary | ICD-10-CM

## 2023-05-27 DIAGNOSIS — Z79899 Other long term (current) drug therapy: Secondary | ICD-10-CM | POA: Diagnosis not present

## 2023-05-27 DIAGNOSIS — K30 Functional dyspepsia: Secondary | ICD-10-CM | POA: Diagnosis present

## 2023-05-27 DIAGNOSIS — G9341 Metabolic encephalopathy: Secondary | ICD-10-CM | POA: Diagnosis present

## 2023-05-27 DIAGNOSIS — K430 Incisional hernia with obstruction, without gangrene: Secondary | ICD-10-CM | POA: Diagnosis present

## 2023-05-27 DIAGNOSIS — Z7985 Long-term (current) use of injectable non-insulin antidiabetic drugs: Secondary | ICD-10-CM

## 2023-05-27 DIAGNOSIS — Z90710 Acquired absence of both cervix and uterus: Secondary | ICD-10-CM | POA: Diagnosis not present

## 2023-05-27 DIAGNOSIS — T383X5A Adverse effect of insulin and oral hypoglycemic [antidiabetic] drugs, initial encounter: Secondary | ICD-10-CM | POA: Diagnosis present

## 2023-05-27 HISTORY — PX: INSERTION OF MESH: SHX5868

## 2023-05-27 HISTORY — PX: LAPAROTOMY: SHX154

## 2023-05-27 LAB — POCT I-STAT, CHEM 8
BUN: 6 mg/dL (ref 6–20)
Calcium, Ion: 1.17 mmol/L (ref 1.15–1.40)
Chloride: 98 mmol/L (ref 98–111)
Creatinine, Ser: 0.7 mg/dL (ref 0.44–1.00)
Glucose, Bld: 96 mg/dL (ref 70–99)
HCT: 39 % (ref 36.0–46.0)
Hemoglobin: 13.3 g/dL (ref 12.0–15.0)
Potassium: 3.5 mmol/L (ref 3.5–5.1)
Sodium: 140 mmol/L (ref 135–145)
TCO2: 31 mmol/L (ref 22–32)

## 2023-05-27 LAB — CBC
HCT: 43.1 % (ref 36.0–46.0)
HCT: 36.4 % (ref 36.0–46.0)
Hemoglobin: 14.9 g/dL (ref 12.0–15.0)
Hemoglobin: 12.4 g/dL (ref 12.0–15.0)
MCH: 28.7 pg (ref 26.0–34.0)
MCH: 28.7 pg (ref 26.0–34.0)
MCHC: 34.6 g/dL (ref 30.0–36.0)
MCHC: 34.1 g/dL (ref 30.0–36.0)
MCV: 83 fL (ref 80.0–100.0)
MCV: 84.3 fL (ref 80.0–100.0)
Platelets: 426 10*3/uL — ABNORMAL HIGH (ref 150–400)
Platelets: 335 10*3/uL (ref 150–400)
RBC: 5.19 MIL/uL — ABNORMAL HIGH (ref 3.87–5.11)
RBC: 4.32 MIL/uL (ref 3.87–5.11)
RDW: 14.1 % (ref 11.5–15.5)
RDW: 13.7 % (ref 11.5–15.5)
WBC: 15.4 10*3/uL — ABNORMAL HIGH (ref 4.0–10.5)
WBC: 14.3 10*3/uL — ABNORMAL HIGH (ref 4.0–10.5)
nRBC: 0 % (ref 0.0–0.2)
nRBC: 0 % (ref 0.0–0.2)

## 2023-05-27 LAB — BLOOD GAS, ARTERIAL
Acid-Base Excess: 10.5 mmol/L — ABNORMAL HIGH (ref 0.0–2.0)
Bicarbonate: 34.3 mmol/L — ABNORMAL HIGH (ref 20.0–28.0)
Drawn by: 14770
FIO2: 21 %
O2 Saturation: 97.9 %
Patient temperature: 37
pCO2 arterial: 41 mmHg (ref 32–48)
pH, Arterial: 7.53 — ABNORMAL HIGH (ref 7.35–7.45)
pO2, Arterial: 79 mmHg — ABNORMAL LOW (ref 83–108)

## 2023-05-27 LAB — COMPREHENSIVE METABOLIC PANEL
ALT: 46 U/L — ABNORMAL HIGH (ref 0–44)
AST: 36 U/L (ref 15–41)
Albumin: 4 g/dL (ref 3.5–5.0)
Alkaline Phosphatase: 69 U/L (ref 38–126)
Anion gap: 12 (ref 5–15)
BUN: 5 mg/dL — ABNORMAL LOW (ref 6–20)
CO2: 31 mmol/L (ref 22–32)
Calcium: 9.6 mg/dL (ref 8.9–10.3)
Chloride: 96 mmol/L — ABNORMAL LOW (ref 98–111)
Creatinine, Ser: 0.77 mg/dL (ref 0.44–1.00)
GFR, Estimated: >60 mL/min (ref >60)
Glucose, Bld: 30 mg/dL — CL (ref 70–99)
Potassium: 2.9 mmol/L — ABNORMAL LOW (ref 3.5–5.1)
Sodium: 139 mmol/L (ref 135–145)
Total Bilirubin: 0.6 mg/dL (ref 0.3–1.2)
Total Protein: 8 g/dL (ref 6.5–8.1)

## 2023-05-27 LAB — BASIC METABOLIC PANEL
Anion gap: 10 (ref 5–15)
BUN: 6 mg/dL (ref 6–20)
CO2: 28 mmol/L (ref 22–32)
Calcium: 8.6 mg/dL — ABNORMAL LOW (ref 8.9–10.3)
Chloride: 100 mmol/L (ref 98–111)
Creatinine, Ser: 0.74 mg/dL (ref 0.44–1.00)
GFR, Estimated: >60 mL/min (ref >60)
Glucose, Bld: 85 mg/dL (ref 70–99)
Potassium: 3.2 mmol/L — ABNORMAL LOW (ref 3.5–5.1)
Sodium: 138 mmol/L (ref 135–145)

## 2023-05-27 LAB — GLUCOSE, CAPILLARY
Glucose-Capillary: 18 mg/dL — CL (ref 70–99)
Glucose-Capillary: 31 mg/dL — CL (ref 70–99)
Glucose-Capillary: 42 mg/dL — CL (ref 70–99)
Glucose-Capillary: 149 mg/dL — ABNORMAL HIGH (ref 70–99)
Glucose-Capillary: 245 mg/dL — ABNORMAL HIGH (ref 70–99)
Glucose-Capillary: 99 mg/dL (ref 70–99)
Glucose-Capillary: 178 mg/dL — ABNORMAL HIGH (ref 70–99)
Glucose-Capillary: 27 mg/dL — CL (ref 70–99)
Glucose-Capillary: 114 mg/dL — ABNORMAL HIGH (ref 70–99)
Glucose-Capillary: 142 mg/dL — ABNORMAL HIGH (ref 70–99)
Glucose-Capillary: 93 mg/dL (ref 70–99)
Glucose-Capillary: 108 mg/dL — ABNORMAL HIGH (ref 70–99)
Glucose-Capillary: 84 mg/dL (ref 70–99)
Glucose-Capillary: 69 mg/dL — ABNORMAL LOW (ref 70–99)

## 2023-05-27 LAB — LIPASE, BLOOD: Lipase: 38 U/L (ref 11–51)

## 2023-05-27 LAB — MAGNESIUM: Magnesium: 2.2 mg/dL (ref 1.7–2.4)

## 2023-05-27 LAB — MRSA NEXT GEN BY PCR, NASAL: MRSA by PCR Next Gen: NOT DETECTED

## 2023-05-27 LAB — HIV ANTIBODY (ROUTINE TESTING W REFLEX): HIV Screen 4th Generation wRfx: NONREACTIVE

## 2023-05-27 SURGERY — LAPAROTOMY, EXPLORATORY
Anesthesia: General | Site: Abdomen

## 2023-05-27 MED ORDER — CEFAZOLIN SODIUM-DEXTROSE 2-4 GM/100ML-% IV SOLN
2.0000 g | INTRAVENOUS | Status: AC
  Administered 2023-05-27: 2 g via INTRAVENOUS

## 2023-05-27 MED ORDER — DEXTROSE 50 % IV SOLN
25.0000 g | INTRAVENOUS | Status: AC
  Administered 2023-05-27: 25 g via INTRAVENOUS
  Filled 2023-05-27: qty 50

## 2023-05-27 MED ORDER — POTASSIUM CL IN DEXTROSE 5% 20 MEQ/L IV SOLN: 20.0000 meq | INTRAVENOUS | Status: DC

## 2023-05-27 MED ORDER — MIDAZOLAM HCL 2 MG/2ML IJ SOLN: 0.5000 mg | Freq: Once | INTRAMUSCULAR | Status: DC | PRN

## 2023-05-27 MED ORDER — OXYCODONE HCL 5 MG/5ML PO SOLN: 5.0000 mg | Freq: Once | ORAL | Status: DC | PRN

## 2023-05-27 MED ORDER — AMISULPRIDE (ANTIEMETIC) 5 MG/2ML IV SOLN
INTRAVENOUS | Status: AC
  Filled 2023-05-27: qty 4

## 2023-05-27 MED ORDER — POLYETHYLENE GLYCOL 3350 17 G PO PACK: 17.0000 g | PACK | Freq: Every day | ORAL | Status: DC | PRN

## 2023-05-27 MED ORDER — ENOXAPARIN SODIUM 40 MG/0.4ML IJ SOSY
40.0000 mg | PREFILLED_SYRINGE | INTRAMUSCULAR | Status: DC
  Administered 2023-05-28: 40 mg via SUBCUTANEOUS
  Filled 2023-05-27: qty 0.4

## 2023-05-27 MED ORDER — AMISULPRIDE (ANTIEMETIC) 5 MG/2ML IV SOLN: 10.0000 mg | Freq: Once | INTRAVENOUS | Status: DC

## 2023-05-27 MED ORDER — OXYMETAZOLINE HCL 0.05 % NA SOLN
NASAL | Status: AC
  Filled 2023-05-27: qty 30

## 2023-05-27 MED ORDER — CHLORHEXIDINE GLUCONATE CLOTH 2 % EX PADS: 6.0000 | MEDICATED_PAD | Freq: Once | CUTANEOUS | Status: DC

## 2023-05-27 MED ORDER — CEFAZOLIN SODIUM-DEXTROSE 2-4 GM/100ML-% IV SOLN
INTRAVENOUS | Status: AC
  Filled 2023-05-27: qty 100

## 2023-05-27 MED ORDER — HYDROMORPHONE HCL 1 MG/ML IJ SOLN
INTRAMUSCULAR | Status: AC
  Filled 2023-05-27: qty 0.5

## 2023-05-27 MED ORDER — CHLORHEXIDINE GLUCONATE CLOTH 2 % EX PADS
6.0000 | MEDICATED_PAD | Freq: Every day | CUTANEOUS | Status: DC
  Administered 2023-05-27 – 2023-06-09 (×13): 6 via TOPICAL

## 2023-05-27 MED ORDER — INSULIN ASPART 100 UNIT/ML IJ SOLN: 0.0000 [IU] | INTRAMUSCULAR | Status: DC | PRN

## 2023-05-27 MED ORDER — IOHEXOL 350 MG/ML SOLN
75.0000 mL | Freq: Once | INTRAVENOUS | Status: AC | PRN
  Administered 2023-05-27: 75 mL via INTRAVENOUS

## 2023-05-27 MED ORDER — HYDROMORPHONE HCL 1 MG/ML IJ SOLN
INTRAMUSCULAR | Status: AC
  Filled 2023-05-27: qty 1

## 2023-05-27 MED ORDER — HYDROMORPHONE HCL 1 MG/ML IJ SOLN
0.2500 mg | INTRAMUSCULAR | Status: DC | PRN
  Administered 2023-05-27 (×4): 0.5 mg via INTRAVENOUS

## 2023-05-27 MED ORDER — MIDAZOLAM HCL 2 MG/2ML IJ SOLN
INTRAMUSCULAR | Status: DC | PRN
  Administered 2023-05-27: 1 mg via INTRAVENOUS

## 2023-05-27 MED ORDER — OXYCODONE HCL 5 MG PO TABS: 5.0000 mg | ORAL_TABLET | Freq: Once | ORAL | Status: DC | PRN

## 2023-05-27 MED ORDER — SUGAMMADEX SODIUM 200 MG/2ML IV SOLN
INTRAVENOUS | Status: DC | PRN
  Administered 2023-05-27: 200 mg via INTRAVENOUS
  Administered 2023-05-27: 100 mg via INTRAVENOUS

## 2023-05-27 MED ORDER — DIPHENHYDRAMINE HCL 50 MG/ML IJ SOLN: 12.5000 mg | Freq: Four times a day (QID) | INTRAMUSCULAR | Status: DC | PRN

## 2023-05-27 MED ORDER — PROPOFOL 10 MG/ML IV BOLUS
INTRAVENOUS | Status: AC
  Filled 2023-05-27: qty 20

## 2023-05-27 MED ORDER — DEXTROSE IN LACTATED RINGERS 5 % IV SOLN: INTRAVENOUS | Status: DC

## 2023-05-27 MED ORDER — HYDROMORPHONE HCL 1 MG/ML IJ SOLN
0.5000 mg | INTRAMUSCULAR | Status: DC | PRN
  Administered 2023-05-27 (×2): 1 mg via INTRAVENOUS
  Filled 2023-05-27: qty 1

## 2023-05-27 MED ORDER — DOCUSATE SODIUM 100 MG PO CAPS: 100.0000 mg | ORAL_CAPSULE | Freq: Two times a day (BID) | ORAL | Status: DC | PRN

## 2023-05-27 MED ORDER — LABETALOL HCL 5 MG/ML IV SOLN
INTRAVENOUS | Status: AC
  Administered 2023-05-27: 5 mg
  Filled 2023-05-27: qty 4

## 2023-05-27 MED ORDER — DEXTROSE 50 % IV SOLN
12.5000 g | Freq: Once | INTRAVENOUS | Status: AC
  Administered 2023-05-27: 12.5 g via INTRAVENOUS

## 2023-05-27 MED ORDER — KCL IN DEXTROSE-NACL 20-5-0.9 MEQ/L-%-% IV SOLN
INTRAVENOUS | Status: DC
  Administered 2023-05-27: 100 mL/h via INTRAVENOUS
  Filled 2023-05-27 (×3): qty 1000

## 2023-05-27 MED ORDER — SUCCINYLCHOLINE CHLORIDE 200 MG/10ML IV SOSY
PREFILLED_SYRINGE | INTRAVENOUS | Status: DC | PRN
  Administered 2023-05-27: 200 mg via INTRAVENOUS

## 2023-05-27 MED ORDER — GLUCOSE 40 % PO GEL
ORAL | Status: AC
  Filled 2023-05-27: qty 1.21

## 2023-05-27 MED ORDER — MEPERIDINE HCL 25 MG/ML IJ SOLN: 6.2500 mg | INTRAMUSCULAR | Status: DC | PRN

## 2023-05-27 MED ORDER — DEXTROSE 50 % IV SOLN
25.0000 g | INTRAVENOUS | Status: DC | PRN
  Administered 2023-05-27: 25 g via INTRAVENOUS
  Filled 2023-05-27: qty 50

## 2023-05-27 MED ORDER — METRONIDAZOLE 500 MG/100ML IV SOLN
500.0000 mg | INTRAVENOUS | Status: AC
  Administered 2023-05-27: 500 mg via INTRAVENOUS

## 2023-05-27 MED ORDER — POTASSIUM CHLORIDE 10 MEQ/100ML IV SOLN
10.0000 meq | INTRAVENOUS | Status: AC
  Administered 2023-05-27 – 2023-05-28 (×3): 10 meq via INTRAVENOUS
  Filled 2023-05-27 (×3): qty 100

## 2023-05-27 MED ORDER — LACTATED RINGERS IV SOLN: INTRAVENOUS | Status: DC

## 2023-05-27 MED ORDER — PROMETHAZINE HCL 25 MG/ML IJ SOLN: 6.2500 mg | INTRAMUSCULAR | Status: DC | PRN

## 2023-05-27 MED ORDER — SODIUM CHLORIDE 0.9% FLUSH: 9.0000 mL | INTRAVENOUS | Status: DC | PRN

## 2023-05-27 MED ORDER — LACTATED RINGERS IV SOLN: INTRAVENOUS | Status: DC | PRN

## 2023-05-27 MED ORDER — ACETAMINOPHEN 10 MG/ML IV SOLN
INTRAVENOUS | Status: DC | PRN
  Administered 2023-05-27: 1000 mg via INTRAVENOUS

## 2023-05-27 MED ORDER — DEXAMETHASONE SODIUM PHOSPHATE 10 MG/ML IJ SOLN
INTRAMUSCULAR | Status: DC | PRN
  Administered 2023-05-27: 10 mg via INTRAVENOUS

## 2023-05-27 MED ORDER — GLUCAGON HCL RDNA (DIAGNOSTIC) 1 MG IJ SOLR
INTRAMUSCULAR | Status: AC
  Filled 2023-05-27: qty 1

## 2023-05-27 MED ORDER — HYDROMORPHONE 1 MG/ML IV SOLN
INTRAVENOUS | Status: DC
  Administered 2023-05-27: 30 mg via INTRAVENOUS
  Administered 2023-05-28: 1.8 mg via INTRAVENOUS
  Administered 2023-05-28: 2.4 mg via INTRAVENOUS
  Administered 2023-05-28: 1.8 mg via INTRAVENOUS
  Administered 2023-05-29: 0.9 mg via INTRAVENOUS
  Administered 2023-05-29: 1.2 mg via INTRAVENOUS
  Administered 2023-05-29: 0.9 mg via INTRAVENOUS
  Administered 2023-05-30: 1.5 mg via INTRAVENOUS
  Filled 2023-05-27: qty 30

## 2023-05-27 MED ORDER — PHENYLEPHRINE HCL-NACL 20-0.9 MG/250ML-% IV SOLN
INTRAVENOUS | Status: DC | PRN
  Administered 2023-05-27: 50 ug/min via INTRAVENOUS

## 2023-05-27 MED ORDER — ONDANSETRON HCL 4 MG/2ML IJ SOLN
INTRAMUSCULAR | Status: DC | PRN
  Administered 2023-05-27: 4 mg via INTRAVENOUS

## 2023-05-27 MED ORDER — 0.9 % SODIUM CHLORIDE (POUR BTL) OPTIME
TOPICAL | Status: DC | PRN
  Administered 2023-05-27: 2000 mL

## 2023-05-27 MED ORDER — ACETAMINOPHEN 10 MG/ML IV SOLN
INTRAVENOUS | Status: AC
  Filled 2023-05-27: qty 100

## 2023-05-27 MED ORDER — NALOXONE HCL 0.4 MG/ML IJ SOLN: 0.4000 mg | INTRAMUSCULAR | Status: DC | PRN

## 2023-05-27 MED ORDER — METRONIDAZOLE 500 MG/100ML IV SOLN
INTRAVENOUS | Status: AC
  Filled 2023-05-27: qty 100

## 2023-05-27 MED ORDER — FENTANYL CITRATE (PF) 250 MCG/5ML IJ SOLN
INTRAMUSCULAR | Status: DC | PRN
  Administered 2023-05-27: 150 ug via INTRAVENOUS

## 2023-05-27 MED ORDER — PHENYLEPHRINE 80 MCG/ML (10ML) SYRINGE FOR IV PUSH (FOR BLOOD PRESSURE SUPPORT)
PREFILLED_SYRINGE | INTRAVENOUS | Status: DC | PRN
  Administered 2023-05-27: 80 ug via INTRAVENOUS

## 2023-05-27 MED ORDER — HYDROMORPHONE HCL 1 MG/ML IJ SOLN
INTRAMUSCULAR | Status: DC | PRN
  Administered 2023-05-27: .5 mg via INTRAVENOUS

## 2023-05-27 MED ORDER — DIPHENHYDRAMINE HCL 12.5 MG/5ML PO ELIX: 12.5000 mg | ORAL_SOLUTION | Freq: Four times a day (QID) | ORAL | Status: DC | PRN

## 2023-05-27 MED ORDER — POTASSIUM CHLORIDE 10 MEQ/100ML IV SOLN
10.0000 meq | INTRAVENOUS | Status: AC
  Filled 2023-05-27 (×3): qty 100

## 2023-05-27 MED ORDER — CHLORHEXIDINE GLUCONATE 0.12 % MT SOLN
OROMUCOSAL | Status: AC
  Filled 2023-05-27: qty 15

## 2023-05-27 MED ORDER — ONDANSETRON HCL 4 MG/2ML IJ SOLN
4.0000 mg | Freq: Four times a day (QID) | INTRAMUSCULAR | Status: DC | PRN
  Administered 2023-05-28 – 2023-06-02 (×5): 4 mg via INTRAVENOUS
  Filled 2023-05-27 (×5): qty 2

## 2023-05-27 MED ORDER — ALBUMIN HUMAN 5 % IV SOLN: INTRAVENOUS | Status: DC | PRN

## 2023-05-27 MED ORDER — PROPOFOL 10 MG/ML IV BOLUS
INTRAVENOUS | Status: DC | PRN
  Administered 2023-05-27: 200 mg via INTRAVENOUS

## 2023-05-27 MED ORDER — GLUCOSE 40 % PO GEL: 1.0000 | Freq: Once | ORAL | Status: DC

## 2023-05-27 MED ORDER — ORAL CARE MOUTH RINSE: 15.0000 mL | Freq: Once | OROMUCOSAL | Status: DC

## 2023-05-27 MED ORDER — LABETALOL HCL 5 MG/ML IV SOLN: 5.0000 mg | INTRAVENOUS | Status: DC | PRN

## 2023-05-27 MED ORDER — ROCURONIUM BROMIDE 10 MG/ML (PF) SYRINGE
PREFILLED_SYRINGE | INTRAVENOUS | Status: DC | PRN
  Administered 2023-05-27: 50 mg via INTRAVENOUS

## 2023-05-27 MED ORDER — GLUCAGON HCL RDNA (DIAGNOSTIC) 1 MG IJ SOLR: 1.0000 mg | Freq: Once | INTRAMUSCULAR | Status: AC | PRN

## 2023-05-27 MED ORDER — FENTANYL CITRATE (PF) 250 MCG/5ML IJ SOLN
INTRAMUSCULAR | Status: AC
  Filled 2023-05-27: qty 5

## 2023-05-27 MED ORDER — MIDAZOLAM HCL 2 MG/2ML IJ SOLN
INTRAMUSCULAR | Status: AC
  Filled 2023-05-27: qty 2

## 2023-05-27 MED ORDER — CHLORHEXIDINE GLUCONATE 0.12 % MT SOLN: 15.0000 mL | Freq: Once | OROMUCOSAL | Status: DC

## 2023-05-27 SURGICAL SUPPLY — 62 items
ADH SKN CLS APL DERMABOND .7 (GAUZE/BANDAGES/DRESSINGS)
APL PRP STRL LF DISP 70% ISPRP (MISCELLANEOUS)
BAG COUNTER SPONGE SURGICOUNT (BAG) ×2 IMPLANT
BAG SPNG CNTER NS LX DISP (BAG) ×2
BINDER ABDOMINAL 12 ML 46-62 (SOFTGOODS) ×1 IMPLANT
BIOPATCH RED 1 DISK 7.0 (GAUZE/BANDAGES/DRESSINGS) ×1 IMPLANT
BLADE CLIPPER SURG (BLADE) IMPLANT
CANISTER SUCT 3000ML PPV (MISCELLANEOUS) ×2 IMPLANT
CHLORAPREP W/TINT 26 (MISCELLANEOUS) ×1 IMPLANT
COVER SURGICAL LIGHT HANDLE (MISCELLANEOUS) ×2 IMPLANT
DERMABOND ADVANCED .7 DNX12 (GAUZE/BANDAGES/DRESSINGS) ×2 IMPLANT
DRAIN CHANNEL 19F RND (DRAIN) ×1 IMPLANT
DRAIN RELI 100 BL SUC LF ST (DRAIN) ×2
DRAPE INCISE IOBAN 66X45 STRL (DRAPES) ×1 IMPLANT
DRAPE LAPAROSCOPIC ABDOMINAL (DRAPES) ×2 IMPLANT
DRAPE WARM FLUID 44X44 (DRAPES) ×2 IMPLANT
DRSG OPSITE POSTOP 4X10 (GAUZE/BANDAGES/DRESSINGS) IMPLANT
DRSG OPSITE POSTOP 4X8 (GAUZE/BANDAGES/DRESSINGS) ×2 IMPLANT
DRSG TEGADERM 2-3/8X2-3/4 SM (GAUZE/BANDAGES/DRESSINGS) ×1 IMPLANT
ELECT BLADE 6.5 EXT (BLADE) IMPLANT
ELECT CAUTERY BLADE 6.4 (BLADE) ×2 IMPLANT
ELECT REM PT RETURN 9FT ADLT (ELECTROSURGICAL) ×2
ELECTRODE REM PT RTRN 9FT ADLT (ELECTROSURGICAL) ×2 IMPLANT
EVACUATOR SILICONE 100CC (DRAIN) ×1 IMPLANT
GAUZE SPONGE 2X2 8PLY STRL LF (GAUZE/BANDAGES/DRESSINGS) ×1 IMPLANT
GLOVE BIOGEL PI IND STRL 6 (GLOVE) ×2 IMPLANT
GLOVE BIOGEL PI MICRO STRL 5.5 (GLOVE) ×2 IMPLANT
GOWN STRL REUS W/ TWL LRG LVL3 (GOWN DISPOSABLE) ×5 IMPLANT
GOWN STRL REUS W/TWL LRG LVL3 (GOWN DISPOSABLE) ×6
HANDLE SUCTION POOLE (INSTRUMENTS) ×2 IMPLANT
KIT BASIN OR (CUSTOM PROCEDURE TRAY) ×2 IMPLANT
KIT TURNOVER KIT B (KITS) ×2 IMPLANT
LIGASURE IMPACT 36 18CM CVD LR (INSTRUMENTS) IMPLANT
MESH VICRYL KNITTED 12X12 (Mesh General) ×1 IMPLANT
NS IRRIG 1000ML POUR BTL (IV SOLUTION) ×4 IMPLANT
PACK GENERAL/GYN (CUSTOM PROCEDURE TRAY) ×2 IMPLANT
PAD ARMBOARD 7.5X6 YLW CONV (MISCELLANEOUS) ×2 IMPLANT
PENCIL SMOKE EVACUATOR (MISCELLANEOUS) ×2 IMPLANT
RETAINER VISCERA MED (MISCELLANEOUS) ×1 IMPLANT
SLEEVE SUCTION CATH 165 (SLEEVE) ×2 IMPLANT
SPECIMEN JAR LARGE (MISCELLANEOUS) IMPLANT
SPONGE T-LAP 18X18 ~~LOC~~+RFID (SPONGE) IMPLANT
STAPLER VISISTAT 35W (STAPLE) ×1 IMPLANT
SUCTION POOLE HANDLE (INSTRUMENTS) ×2
SUT ETHILON 2 0 FS 18 (SUTURE) ×1 IMPLANT
SUT MNCRL AB 4-0 PS2 18 (SUTURE) ×2 IMPLANT
SUT NOVA 1 T20/GS 25DT (SUTURE) ×2 IMPLANT
SUT PDS AB 1 TP1 96 (SUTURE) ×2 IMPLANT
SUT SILK 2 0 SH CR/8 (SUTURE) ×1 IMPLANT
SUT SILK 2 0 TIES 10X30 (SUTURE) ×1 IMPLANT
SUT SILK 3 0 SH CR/8 (SUTURE) ×2 IMPLANT
SUT SILK 3 0 TIES 10X30 (SUTURE) ×1 IMPLANT
SUT VIC AB 0 CT1 18XCR BRD 8 (SUTURE) ×3 IMPLANT
SUT VIC AB 0 CT1 8-18 (SUTURE) ×6
SUT VIC AB 3-0 SH 18 (SUTURE) IMPLANT
SUT VIC AB 3-0 SH 27 (SUTURE)
SUT VIC AB 3-0 SH 27XBRD (SUTURE) ×2 IMPLANT
SUT VIC AB 3-0 SH 8-18 (SUTURE) ×1 IMPLANT
TOWEL GREEN STERILE (TOWEL DISPOSABLE) ×2 IMPLANT
TRAY FOLEY MTR SLVR 14FR STAT (SET/KITS/TRAYS/PACK) ×1 IMPLANT
TRAY FOLEY MTR SLVR 16FR STAT (SET/KITS/TRAYS/PACK) IMPLANT
YANKAUER SUCT BULB TIP NO VENT (SUCTIONS) ×1 IMPLANT

## 2023-05-27 NOTE — Anesthesia Postprocedure Evaluation (Signed)
Anesthesia Post Note  Patient: Maria [name]  Procedure(s) Performed: RE-EXPLORATORY LAPAROTOMY, FASCIAL CLOSURE (Abdomen) INSERTION OF MESH, VICRYL (Abdomen)     Patient location during evaluation: PACU Anesthesia Type: General Level of consciousness: sedated, patient cooperative and oriented Pain management: pain level controlled Vital Signs Assessment: post-procedure vital signs reviewed and stable Respiratory status: spontaneous breathing, nonlabored ventilation, respiratory function stable and patient connected to nasal cannula oxygen Cardiovascular status: blood pressure returned to baseline and stable Postop Assessment: no apparent nausea or vomiting Anesthetic complications: no Comments: Blood sugar is very labile, continuing D5 infusion   No notable events documented.  Last Vitals:  Vitals:   05/27/23 1915 05/27/23 1930  BP: (!) 150/101 (!) 138/90  Pulse: 96 (!) 101  Resp: 13 12  Temp:    SpO2: 98% 98%    Last Pain:  Vitals:   05/27/23 1930  TempSrc:   PainSc: 0-No pain                 Madasyn Heath,E. Aryianna Earwood

## 2023-05-27 NOTE — Progress Notes (Signed)
Dr. Jairo Ben at bedside to round on patient. Patient told Dr. Jean Rosenthal her pain was better but that she was "just over it". Patient's blood pressure  has been increased and she does not look relaxed despite stating her pain is better. Brow is furrowed; noted by myself and Dr. Jean Rosenthal. Dr. Jean Rosenthal ordered  one time dose of  5 mg Labetalol to be given IV.  Administered as ordered with desired results obtained. 1952 as we were in the hallway to transport patient back to 3 M we met Dr. Jean Rosenthal who asked that we make sure the nurse obtains another CBG when she arrives to the floor. Information was passed on via telephone call. Patient tolerated transfer to 3 M well and was met by her RN, and charge nurse of the unit. Patients family is in 40 M waiting room and will be allowed to visit after she is settled. RN Kennyth Arnold declined any further need of information or assistance at this time. Patient was appreciative of care provided and in no distress.

## 2023-05-27 NOTE — MAU Note (Signed)
RR nurse at bedside

## 2023-05-27 NOTE — Anesthesia Preprocedure Evaluation (Addendum)
Anesthesia Evaluation  Patient identified by MRN, date of birth, ID band Patient awake    Reviewed: Allergy & Precautions, NPO status , Patient's Chart, lab work & pertinent test results  History of Anesthesia Complications Negative for: history of anesthetic complications  Airway Mallampati: II  TM Distance: >3 FB Neck ROM: Full    Dental  (+) Dental Advisory Given, Chipped, Missing   Pulmonary COPD,  COPD inhaler, Current SmokerPatient did not abstain from smoking.   breath sounds clear to auscultation       Cardiovascular negative cardio ROS  Rhythm:Regular Rate:Normal     Neuro/Psych  Headaches    GI/Hepatic Neg liver ROS,GERD  ,,Ileus s/p recent ex-lap for adhesions: vomiting (K+ 2.9)   Endo/Other  diabetes, Oral Hypoglycemic Agents  Blood sugar 30 on arrival today: encephalopathic  Renal/GU H/o stones     Musculoskeletal   Abdominal  (+) + obese  Peds  Hematology negative hematology ROS (+)   Anesthesia Other Findings   Reproductive/Obstetrics                             Anesthesia Physical Anesthesia Plan  ASA: 3 and emergent  Anesthesia Plan: General   Post-op Pain Management: Ofirmev IV (intra-op)*   Induction: Intravenous and Rapid sequence  PONV Risk Score and Plan: 2 and Ondansetron, Dexamethasone and Treatment may vary due to age or medical condition  Airway Management Planned: Oral ETT  Additional Equipment: None  Intra-op Plan:   Post-operative Plan: Possible Post-op intubation/ventilation  Informed Consent: I have reviewed the patients History and Physical, chart, labs and discussed the procedure including the risks, benefits and alternatives for the proposed anesthesia with the patient or authorized representative who has indicated his/her understanding and acceptance.     Dental advisory given  Plan Discussed with: CRNA and Surgeon  Anesthesia Plan  Comments:        Anesthesia Quick Evaluation

## 2023-05-27 NOTE — MAU Provider Note (Signed)
MAU Provider Note  History  [id number]  Arrival date and time: 05/27/23 1207   Chief Complaint  Patient presents with   Nausea   Emesis     HPI Maria [name] is a 42 y.o. POD#7 from Ex lap including ovarian hemorrhagic cyst removal and adhesion take down with colonic serosal tear repair. Pt comes in with sister who notes she has a BG of 38 at home ~11am, continuous vomiting, bilious, decreased PO intake and worsening confusion. Has not had a BM since operation, but has passed gas. Pt unable to corroborate story due to acute delirium.   --/--/B POS (07/20 0245)  OB History  No obstetric history on file.     Past Medical History:  Diagnosis Date   Acid reflux    Chronic kidney disease    kidney stones    Diabetes mellitus without complication (HCC)    Dysmenorrhea    Fibroids    Headache    Kidney stone     Past Surgical History:  Procedure Laterality Date   ABDOMINAL HYSTERECTOMY N/A 05/27/2015   Procedure: HYSTERECTOMY ABDOMINAL;  Surgeon: Myna Hidalgo, DO;  Location: [facility];  Service: Gynecology;  Laterality: N/A;  669.1g    BILATERAL SALPINGECTOMY Bilateral 05/27/2015   Procedure: BILATERAL SALPINGECTOMY;  Surgeon: Myna Hidalgo, DO;  Location: [facility];  Service: Gynecology;  Laterality: Bilateral;   CESAREAN SECTION     LAPAROSCOPY N/A 05/20/2023   Procedure: LAPAROSCOPY DIAGNOSTIC;  Surgeon: Warden Fillers, MD;  Location: [facility] OR;  Service: Gynecology;  Laterality: N/A;   LAPAROTOMY  05/20/2023   Procedure: EXPLORATORY LAPAROTOMY WITH LYSIS OF ADHESIONS, SEROSA REPAIR, EVACUATION OF HEMMORHAGIC OVARIAN CYST;  Surgeon: Warden Fillers, MD;  Location: [facility] OR;  Service: Gynecology;;   LITHOTRIPSY      Family History  Problem Relation Age of Onset   Diabetes Mother    Hypertension Mother    Heart attack Mother    Healthy Father     Social History   Socioeconomic History   Marital status: Single    Spouse name: Not on file   Number of children: 1   Years of  education: some college   Highest education level: Not on file  Occupational History   Occupation: works in Museum/gallery curator  Tobacco Use   Smoking status: Every Day    Current packs/day: 0.50    Types: Cigarettes   Smokeless tobacco: Never  Vaping Use   Vaping status: Never Used  Substance and Sexual Activity   Alcohol use: Yes    Comment: social    Drug use: No   Sexual activity: Not on file  Other Topics Concern   Not on file  Social History Narrative   Lives at home with her partner, Inetta Fermo and children.   Right-handed.   One cup caffeine per day.   Social Determinants of Health   Financial Resource Strain: Not on file  Food Insecurity: No Food Insecurity (05/19/2023)   Hunger Vital Sign    Worried About Running Out of Food in the Last Year: Never true    Ran Out of Food in the Last Year: Never true  Transportation Needs: No Transportation Needs (05/19/2023)   PRAPARE - Administrator, Civil Service (Medical): No    Lack of Transportation (Non-Medical): No  Physical Activity: Not on file  Stress: Not on file  Social Connections: Not on file  Intimate Partner Violence: Not At Risk (05/19/2023)   Humiliation, Afraid, Rape, and  Kick questionnaire    Fear of Current or Ex-Partner: No    Emotionally Abused: No    Physically Abused: No    Sexually Abused: No    No Known Allergies  No current facility-administered medications on file prior to encounter.   Current Outpatient Medications on File Prior to Encounter  Medication Sig Dispense Refill   albuterol (PROVENTIL HFA;VENTOLIN HFA) 108 (90 BASE) MCG/ACT inhaler Inhale 1-2 puffs into the lungs every 6 (six) hours as needed for wheezing or shortness of breath. 1 Inhaler 0   atorvastatin (LIPITOR) 10 MG tablet Take 10 mg by mouth daily.     escitalopram (LEXAPRO) 20 MG tablet Take 20 mg by mouth daily.     fluticasone (FLONASE) 50 MCG/ACT nasal spray Place 1 spray into both nostrils as needed for  allergies.     glyBURIDE (DIABETA) 5 MG tablet Take 1 tablet (5 mg total) by mouth 2 (two) times daily with a meal. 30 tablet 1   hydrOXYzine (ATARAX) 25 MG tablet Take 25 mg by mouth every 8 (eight) hours as needed (sleep).     ibuprofen (ADVIL) 600 MG tablet Take 1 tablet (600 mg total) by mouth every 6 (six) hours as needed. 60 tablet 1   ondansetron (ZOFRAN-ODT) 4 MG disintegrating tablet Take 1 tablet (4 mg total) by mouth every 8 (eight) hours as needed for nausea. 40 tablet 0   oxyCODONE (OXY IR/ROXICODONE) 5 MG immediate release tablet Take 1 tablet (5 mg total) by mouth every 6 (six) hours as needed for moderate pain. 30 tablet 0   TRULICITY 1.5 MG/0.5ML SOPN Inject 1.5 mg into the skin once a week. Sundays      ROS: Pertinent positives and negative per HPI, all others reviewed and negative  Physical Exam   BP (!) 161/74 (BP Location: Right Arm)   Pulse (!) 103   Resp 17   LMP 05/12/2015 (Exact Date)   Patient Vitals for the past 24 hrs:  BP Pulse Resp  05/27/23 1222 (!) 161/74 (!) 103 17    Physical Exam Vitals and nursing note reviewed. Exam conducted with a chaperone present.  Constitutional:      General: She is in acute distress.     Appearance: She is ill-appearing and toxic-appearing.  HENT:     Head: Normocephalic and atraumatic.     Right Ear: External ear normal.     Left Ear: External ear normal.     Nose: Nose normal.     Mouth/Throat:     Mouth: Mucous membranes are dry.  Eyes:     Conjunctiva/sclera: Conjunctivae normal.  Cardiovascular:     Rate and Rhythm: Regular rhythm. Tachycardia present.  Pulmonary:     Effort: Pulmonary effort is normal.     Breath sounds: Normal breath sounds.  Abdominal:     General: Abdomen is flat. There is distension.     Palpations: Abdomen is soft.     Tenderness: There is abdominal tenderness.  Skin:    General: Skin is warm and dry.     Capillary Refill: Capillary refill takes less than 2 seconds.  Neurological:      Mental Status: She is lethargic, disoriented and confused.     Gait: Gait abnormal.     Labs Results for orders placed or performed during the hospital encounter of 05/27/23 (from the past 24 hour(s))  Glucose, capillary     Status: Abnormal   Collection Time: 05/27/23 12:28 PM  Result Value Ref Range  Glucose-Capillary 18 (LL) 70 - 99 mg/dL  CBC     Status: Abnormal   Collection Time: 05/27/23 12:43 PM  Result Value Ref Range   WBC 15.4 (H) 4.0 - 10.5 K/uL   RBC 5.19 (H) 3.87 - 5.11 MIL/uL   Hemoglobin 14.9 12.0 - 15.0 g/dL   HCT 40.9 81.1 - 91.4 %   MCV 83.0 80.0 - 100.0 fL   MCH 28.7 26.0 - 34.0 pg   MCHC 34.6 30.0 - 36.0 g/dL   RDW 78.2 95.6 - 21.3 %   Platelets 426 (H) 150 - 400 K/uL   nRBC 0.0 0.0 - 0.2 %  Comprehensive metabolic panel     Status: Abnormal   Collection Time: 05/27/23 12:43 PM  Result Value Ref Range   Sodium 139 135 - 145 mmol/L   Potassium 2.9 (L) 3.5 - 5.1 mmol/L   Chloride 96 (L) 98 - 111 mmol/L   CO2 31 22 - 32 mmol/L   Glucose, Bld 30 (LL) 70 - 99 mg/dL   BUN 5 (L) 6 - 20 mg/dL   Creatinine, Ser 0.86 0.44 - 1.00 mg/dL   Calcium 9.6 8.9 - 57.8 mg/dL   Total Protein 8.0 6.5 - 8.1 g/dL   Albumin 4.0 3.5 - 5.0 g/dL   AST 36 15 - 41 U/L   ALT 46 (H) 0 - 44 U/L   Alkaline Phosphatase 69 38 - 126 U/L   Total Bilirubin 0.6 0.3 - 1.2 mg/dL   GFR, Estimated >46 >96 mL/min   Anion gap 12 5 - 15  Glucose, capillary     Status: Abnormal   Collection Time: 05/27/23 12:49 PM  Result Value Ref Range   Glucose-Capillary 31 (LL) 70 - 99 mg/dL  Glucose, capillary     Status: Abnormal   Collection Time: 05/27/23  1:14 PM  Result Value Ref Range   Glucose-Capillary 42 (LL) 70 - 99 mg/dL   Comment 1 Notify RN   Blood gas, arterial     Status: Abnormal   Collection Time: 05/27/23  1:20 PM  Result Value Ref Range   FIO2 21.00 %   Delivery systems ROOM AIR    pH, Arterial 7.53 (H) 7.35 - 7.45   pCO2 arterial 41 32 - 48 mmHg   pO2, Arterial 79 (L) 83  - 108 mmHg   Bicarbonate 34.3 (H) 20.0 - 28.0 mmol/L   Acid-Base Excess 10.5 (H) 0.0 - 2.0 mmol/L   O2 Saturation 97.9 %   Patient temperature 37.0    Collection site RIGHT RADIAL    Drawn by [id number]    Allens test (pass/fail) PASS PASS  Glucose, capillary     Status: Abnormal   Collection Time: 05/27/23  1:39 PM  Result Value Ref Range   Glucose-Capillary 245 (H) 70 - 99 mg/dL  Glucose, capillary     Status: Abnormal   Collection Time: 05/27/23  2:10 PM  Result Value Ref Range   Glucose-Capillary 149 (H) 70 - 99 mg/dL  Glucose, capillary     Status: None   Collection Time: 05/27/23  3:05 PM  Result Value Ref Range   Glucose-Capillary 99 70 - 99 mg/dL  Glucose, capillary     Status: Abnormal   Collection Time: 05/27/23  4:36 PM  Result Value Ref Range   Glucose-Capillary 27 (LL) 70 - 99 mg/dL  Glucose, capillary     Status: Abnormal   Collection Time: 05/27/23  5:01 PM  Result Value Ref Range   Glucose-Capillary  178 (H) 70 - 99 mg/dL  Glucose, capillary     Status: Abnormal   Collection Time: 05/27/23  5:30 PM  Result Value Ref Range   Glucose-Capillary 142 (H) 70 - 99 mg/dL    Imaging CT ABDOMEN PELVIS W CONTRAST  Result Date: 05/27/2023 CLINICAL DATA:  Acute generalized abdominal pain EXAM: CT ABDOMEN AND PELVIS WITH CONTRAST TECHNIQUE: Multidetector CT imaging of the abdomen and pelvis was performed using the standard protocol following bolus administration of intravenous contrast. RADIATION DOSE REDUCTION: This exam was performed according to the departmental dose-optimization program which includes automated exposure control, adjustment of the mA and/or kV according to patient size and/or use of iterative reconstruction technique. CONTRAST:  75mL OMNIPAQUE IOHEXOL 350 MG/ML SOLN COMPARISON:  May 19, 2023. FINDINGS: Lower chest: No acute abnormality. Hepatobiliary: No focal liver abnormality is seen. No gallstones, gallbladder wall thickening, or biliary dilatation. Pancreas:  Unremarkable. No pancreatic ductal dilatation or surrounding inflammatory changes. Spleen: Normal in size without focal abnormality. Adrenals/Urinary Tract: Adrenal glands are unremarkable. Kidneys are normal, without renal calculi, focal lesion, or hydronephrosis. Bladder is unremarkable. Stomach/Bowel: There is now noted moderate to severe gastric distention as well as moderate to severe small bowel dilatation due to interval development of large ventral hernia seen anteriorly in the pelvis. This hernia contains multiple loops of small bowel, several which are dilated. This appears to be resulting at least partial obstruction of the more proximal small bowel loops. The colon is unremarkable and contains stool. The appendix is unremarkable. 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 No other vascular abnormality is noted. No adenopathy is noted. Reproductive: Status post hysterectomy and bilateral salpingectomy. No definite adnexal abnormality is noted currently. Other: No definite ascites is noted. Postsurgical staples are seen involving the lower abdominal wall and along the inferior margin of previously described large hernia. Musculoskeletal: No acute or significant osseous findings. IMPRESSION: There is interval development of large ventral hernia anteriorly in the pelvis which contains multiple loops of small bowel, several of which are dilated. This appears to be resulting in at least partial obstruction of the more proximal small bowel loops, as well as moderate to severe gastric distention. There are noted surgical clips involving the lower anterior abdominal wall in the vicinity of the hernia. Electronically Signed   By: Lupita Raider M.D.   On: 05/27/2023 15:15   CT HEAD WO CONTRAST ( )  Result Date: 05/27/2023 CLINICAL DATA:  Delirium. EXAM: CT HEAD WITHOUT CONTRAST TECHNIQUE: Contiguous axial  images were obtained from the base of the skull through the vertex without intravenous contrast. RADIATION DOSE REDUCTION: This exam was performed according to the departmental dose-optimization program which includes automated exposure control, adjustment of the mA and/or kV according to patient size and/or use of iterative reconstruction technique. COMPARISON:  05/25/2014 FINDINGS: Brain: No evidence of intracranial hemorrhage, acute infarction, hydrocephalus, extra-axial collection, or mass lesion/mass effect. Vascular:  No hyperdense vessel or other acute findings. Skull: No evidence of fracture or other significant bone abnormality. Sinuses/Orbits:  No acute findings. Other: None. IMPRESSION: Negative. Electronically Signed   By: Danae Orleans M.D.   On: 05/27/2023 15:09   DG Abdomen 1 View  Result Date: 05/27/2023 CLINICAL DATA:  Nausea and vomiting EXAM: ABDOMEN - 1 VIEW COMPARISON:  None Available. FINDINGS: Multiple dilated loops of small bowel throughout the midabdomen. There is gas and stool in the colon. Surgical changes along the pelvis.  Films are under penetrated. No obvious free air on these portable supine radiographs. The diaphragm is clipped off the edge of the film. IMPRESSION: Surgical changes along the pelvis. Multiple dilated loops of bowel in the midabdomen. Ileus versus obstruction is possible. Please correlate with clinical presentation and if needed follow up IV contrast CT scan when appropriate Electronically Signed   By: Karen Kays M.D.   On: 05/27/2023 13:49   DG CHEST PORT 1 VIEW  Result Date: 05/27/2023 CLINICAL DATA:  Hypoxemia.  Nausea and vomiting EXAM: PORTABLE CHEST 1 VIEW COMPARISON:  X-ray 05/31/2018 FINDINGS: No consolidation, pneumothorax or effusion. No edema. Normal cardiopericardial silhouette. IMPRESSION: No acute cardiopulmonary disease. Electronically Signed   By: Karen Kays M.D.   On: 05/27/2023 13:43    MAU Course  MDM: high  This patient presents to  the ED for concern of   Chief Complaint  Patient presents with   Nausea   Emesis     These complains involves an extensive number of treatment options, and is a complaint that carries with it a high risk of complications and morbidity.  The differential diagnosis for  1. Acute metabolic encephalopathy w/ N/V INCLUDES hypoglycemia, CVA, MI, Post-op complication  2. Hypoglycemia INCLUDES poor PO intake while on DM meds  3. N/V INVCLUDES DKA, post-op ileus/SBO, metabolic derragement  Co morbidities that complicate the patient evaluation:T2DM, recent ex lap  Additional history obtained from mother and Other sister  Interpreter services used: not applicable  External records from outside source obtained and reviewed including Prior admission records  Lab Tests: UA, CMP, CBC, and Other CBG, lipase, Mag, Arterial blood gas  I ordered, and personally interpreted labs.  The pertinent results include:  all the above  Imaging Studies ordered:  I ordered imaging studies including CXR, ABD XR, CT A/P, CT Head  Cardiac Testing/Monitoring:  EKG was ordered today. I personally reviewed or consulted with a physician to help with intrepretation. NSR  Medicines ordered and prescription drug management:  Medications:    Reevaluation of the patient after these medicines showed that the patient improved I have reviewed the patients home medicines and have made adjustments as needed  Critical Interventions: IV fluids and Other D5, glucagon  Consultations Obtained:  I requested consultation with the critical care team, general surgery team oncology ongoing,  and discussed lab and imaging findings as well as pertinent plan - they recommend: Admission to ICU.  MAU Course: -1228: Patient evaluated and noted to be delirious, only aware to self and place, but not to situation.  POC glucose 18.  Dextrose gel ordered and given.  Ordered stat Labs. Rapid response called -1243: WBC 15.4, platelets 426,  potassium 2.9, glucose 30, ALT 46.  No noted anion gap. - 1249: Repeat CBG 31, given glucagon injection x 2.  Patient noted to be hypoxic, placed on 15 L nonrebreather.   -1253: EKG NSR -1314: Repeat CBG 42, started dextrose 50%, 25 g.  Given persistent hypoglycemia, critical care medicine called. -1320: Arterial blood gas pH 7.53, PaO2 79 and 34.3 bicarb. -1321: Chest x-ray unremarkable, abdominal x-ray with multiple dilated loops of small bowel concerning for ileus versus small bowel obstruction.  General surgery called. -1339: Repeat CBG 245, decreased rate D5.  Patient on room air, more alert and conversational. -1345: CCM at bedside.  Will initiate admission process to MICU.  Dispostion: admitted to the hospital   Assessment and Plan  1. Hypoglycemia  2. Delirium   Patient seen for issues  above.  CME course.  Admitted to the hospital for acute metabolic encephalopathy in the setting of hypoglycemia as a consequence to decreased p.o. intake, bilious vomiting s/p ex lap, possible ileus vs SBO.   Myrtie Hawk, DO FMOB Fellow, Faculty practice [facility], Center for [facility] 05/27/23  12:53 PM

## 2023-05-27 NOTE — Transfer of Care (Signed)
Immediate Anesthesia Transfer of Care Note  Patient: Maria [name]  Procedure(s) Performed: RE-EXPLORATORY LAPAROTOMY, FASCIAL CLOSURE (Abdomen) INSERTION OF MESH, VICRYL (Abdomen)  Patient Location: PACU  Anesthesia Type:General  Level of Consciousness: awake, alert , and oriented  Airway & Oxygen Therapy: Patient Spontanous Breathing and Patient connected to nasal cannula oxygen  Post-op Assessment: Report given to RN and Post -op Vital signs reviewed and stable  Post vital signs: Reviewed and stable  Last Vitals:  Vitals Value Taken Time  BP 153/107 05/27/23 1830  Temp 36.7 C 05/27/23 1827  Pulse 113 05/27/23 1836  Resp 11 05/27/23 1836  SpO2 98 % 05/27/23 1836  Vitals shown include unfiled device data.  Last Pain:  Vitals:   05/27/23 1827  TempSrc:   PainSc: 10-Worst pain ever      Patients Stated Pain Goal: 3 (05/27/23 1827)  Complications: No notable events documented.

## 2023-05-27 NOTE — H&P (Signed)
NAME:  Maria [name], MRN:  [id number], DOB:  May 07, 1981, LOS: 0 ADMISSION DATE:  05/27/2023, CONSULTATION DATE:  05/27/23 REFERRING MD:  Lanae Crumbly (OB), CHIEF COMPLAINT:  hypoglycemia   History of Present Illness:  42 yo F PMH DM on glyburide, depression, who is POD 7 (Op date 7/20) ex lap evac of L hemorrhagic ovarian cyst, and extensive intraabdominal LOA, sigmoid colon serosal tear repair. She remained in the hospital until dc 7/25. Prior to dc she had + BM and + flatus. On 7/26 in evening pt started to have abd distension, pain. She began vomiting 7/27 approx 0100, and has had ongoing vomiting since. She has not had any add'l BM since leaving hospital. Has tried to eat but vomiting food. She ultimately was brought to MAU by family after they found her 7/27 to be delirious, checked her CBG at home and was hypoglycemic -- 38.   In MAU she remained hypoglycemic requiring dextrose paste, d50 amps, and ultimately d5 gtt. She continued to vomit in MAU. KUB obtained which revealed dilated small bowel loops, concerning for possible ileus.   OB and CCS were notified of pts arrival.   PCCM is also consulted in this setting   Pertinent  Medical History  DM Hemorrhagic ovarian cyst  Intraabdominal adhesions   Significant Hospital Events: Including procedures, antibiotic start and stop dates in addition to other pertinent events   7/20 - ex lap hemorrhagic ovarian cyst evac + abd LOA + sigmoid colon serosal tear repair. 7/25 dc home 7/27 admit to ICU with hypoglycemia, concern for ileus  Interim History / Subjective:  Mentation has improved with glucose correction   Objective   Blood pressure (!) 133/101, pulse 92, resp. rate 17, last menstrual period 05/12/2015, SpO2 98%.       No intake or output data in the 24 hours ending 05/27/23 1442 There were no vitals filed for this visit.  Examination: General: ill appearing middle aged F anxious appearing but NAD  HENT: NCAT pink mm   Lungs: CTAb on RA  Cardiovascular: rrr cap refill brisk  Abdomen: distended soft hypoactive bowel sounds. Cherlynn Polo intact, looks like small amount of serous fluid draining from site -- though this was cleaned prior to my arrival and reportedly there was more drainage earlier.  Extremities: no acute joint deformity  Neuro: AAO GU: defer  Resolved Hospital Problem list     Assessment & Plan:   Acute encephalopathy due to hypoglycemia, improved   DM2 with Hypoglycemia -most likely poor intake + vomiting + taking home sulfonyurea P -cont d5 KCl gtt  -follow CBGs closely   Concern for post-op ileus N/V  -hemorrhagic ovarian cyst eval, extensive abd adhesions requiring LOA and sigmoid colon serosal repair 7/20.  -abd exam not c/w perf right now  P -OB and CCS are consulted  -CT a/p is pending -NPO -will likely need NGT, possible reglan.  will await CCS recs   Leukocytosis  -reactive v infectious, favor against infectious at present P -defer abx initiation currently, if status changes add & cover for intraabd source -trend fever curve WBC   Hypokalemia, hypochloridemia -emesis P -replace K -check mag  -will repeat BMP this evening and AM   Best Practice (right click and "Reselect all SmartList Selections" daily)   Diet/type: NPO DVT prophylaxis: LMWH GI prophylaxis: N/A Lines: N/A Foley:  N/A Code Status:  full code Last date of multidisciplinary goals of care discussion [7/27]  Labs   CBC: Recent Labs  Lab 05/20/23 2208 05/21/23 0446 05/24/23 0501 05/27/23 1243  WBC 16.0* 16.7* 4.8 15.4*  HGB 13.0 13.5 11.6* 14.9  HCT 38.9 40.2 34.4* 43.1  MCV 83.8 85.0 84.9 83.0  PLT 216 222 200 426*    Basic Metabolic Panel: Recent Labs  Lab 05/21/23 0446 05/24/23 0501 05/27/23 1243  NA 134* 133* 139  K 4.1 3.5 2.9*  CL 101 98 96*  CO2 27 26 31   GLUCOSE 168* 162* 30*  BUN 10 12 5*  CREATININE 0.94 0.86 0.77  CALCIUM 8.8* 8.1* 9.6  MG  --  2.1  --     GFR: Estimated Creatinine Clearance: 76.3 mL/min (by C-G formula based on SCr of 0.77 mg/dL). Recent Labs  Lab 05/20/23 2208 05/21/23 0446 05/24/23 0501 05/27/23 1243  WBC 16.0* 16.7* 4.8 15.4*    Liver Function Tests: Recent Labs  Lab 05/21/23 0446 05/27/23 1243  AST 39 36  ALT 23 46*  ALKPHOS 52 69  BILITOT 0.3 0.6  PROT 6.6 8.0  ALBUMIN 3.7 4.0   No results for input(s): "LIPASE", "AMYLASE" in the last 168 hours. No results for input(s): "AMMONIA" in the last 168 hours.  ABG    Component Value Date/Time   PHART 7.53 (H) 05/27/2023 1320   PCO2ART 41 05/27/2023 1320   PO2ART 79 (L) 05/27/2023 1320   HCO3 34.3 (H) 05/27/2023 1320   TCO2 27 05/19/2023 0409   O2SAT 97.9 05/27/2023 1320     Coagulation Profile: No results for input(s): "INR", "PROTIME" in the last 168 hours.  Cardiac Enzymes: No results for input(s): "CKTOTAL", "CKMB", "CKMBINDEX", "TROPONINI" in the last 168 hours.  HbA1C: No results found for: "HGBA1C"  CBG: Recent Labs  Lab 05/27/23 1228 05/27/23 1249 05/27/23 1314 05/27/23 1339 05/27/23 1410  GLUCAP 18* 31* 42* 245* 149*    Review of Systems:    Review of Systems  Constitutional:  Positive for malaise/fatigue.  HENT: Negative.    Eyes: Negative.   Respiratory: Negative.    Cardiovascular: Negative.   Gastrointestinal:  Positive for abdominal pain, constipation, nausea and vomiting.  Musculoskeletal: Negative.   Neurological:  Positive for dizziness, loss of consciousness and weakness.  Endo/Heme/Allergies: Negative.   Psychiatric/Behavioral: Negative.      Past Medical History:  She,  has a past medical history of Acid reflux, Chronic kidney disease, Diabetes mellitus without complication (HCC), Dysmenorrhea, Fibroids, Headache, and Kidney stone.   Surgical History:   Past Surgical History:  Procedure Laterality Date   ABDOMINAL HYSTERECTOMY N/A 05/27/2015   Procedure: HYSTERECTOMY ABDOMINAL;  Surgeon: Myna Hidalgo,  DO;  Location: [facility];  Service: Gynecology;  Laterality: N/A;  669.1g    BILATERAL SALPINGECTOMY Bilateral 05/27/2015   Procedure: BILATERAL SALPINGECTOMY;  Surgeon: Myna Hidalgo, DO;  Location: [facility];  Service: Gynecology;  Laterality: Bilateral;   CESAREAN SECTION     LAPAROSCOPY N/A 05/20/2023   Procedure: LAPAROSCOPY DIAGNOSTIC;  Surgeon: Warden Fillers, MD;  Location: [facility];  Service: Gynecology;  Laterality: N/A;   LAPAROTOMY  05/20/2023   Procedure: EXPLORATORY LAPAROTOMY WITH LYSIS OF ADHESIONS, SEROSA REPAIR, EVACUATION OF HEMMORHAGIC OVARIAN CYST;  Surgeon: Warden Fillers, MD;  Location: [facility] OR;  Service: Gynecology;;   LITHOTRIPSY       Social History:   reports that she has been smoking cigarettes. She has never used smokeless tobacco. She reports current alcohol use. She reports that she does not use drugs.   Family History:  Her family history includes Diabetes in her mother;  Healthy in her father; Heart attack in her mother; Hypertension in her mother.   Allergies No Known Allergies   Home Medications  Prior to Admission medications   Medication Sig Start Date End Date Taking? Authorizing Provider  albuterol (PROVENTIL HFA;VENTOLIN HFA) 108 (90 BASE) MCG/ACT inhaler Inhale 1-2 puffs into the lungs every 6 (six) hours as needed for wheezing or shortness of breath. 10/10/15   Lorre Nick, MD  atorvastatin (LIPITOR) 10 MG tablet Take 10 mg by mouth daily. 04/08/23   [provider]  escitalopram (LEXAPRO) 20 MG tablet Take 20 mg by mouth daily.    [provider]  fluticasone (FLONASE) 50 MCG/ACT nasal spray Place 1 spray into both nostrils as needed for allergies. 02/17/23   [provider]  glyBURIDE (DIABETA) 5 MG tablet Take 1 tablet (5 mg total) by mouth 2 (two) times daily with a meal. 05/25/23   Hermina Staggers, MD  hydrOXYzine (ATARAX) 25 MG tablet Take 25 mg by mouth every 8 (eight) hours as needed (sleep).    [provider]   ibuprofen (ADVIL) 600 MG tablet Take 1 tablet (600 mg total) by mouth every 6 (six) hours as needed. 05/25/23   Hermina Staggers, MD  ondansetron (ZOFRAN-ODT) 4 MG disintegrating tablet Take 1 tablet (4 mg total) by mouth every 8 (eight) hours as needed for nausea. 05/25/23   Hermina Staggers, MD  oxyCODONE (OXY IR/ROXICODONE) 5 MG immediate release tablet Take 1 tablet (5 mg total) by mouth every 6 (six) hours as needed for moderate pain. 05/25/23   Hermina Staggers, MD  TRULICITY 1.5 MG/0.5ML SOPN Inject 1.5 mg into the skin once a week. Sundays 05/12/23   [provider]     Critical care time: 40 min      CRITICAL CARE Performed by: Lanier Clam   Total critical care time: 40 minutes  Critical care time was exclusive of separately billable procedures and treating other patients.  Critical care was necessary to treat or prevent imminent or life-threatening deterioration.  Critical care was time spent personally by me on the following activities: development of treatment plan with patient and/or surrogate as well as nursing, discussions with consultants, evaluation of patient's response to treatment, examination of patient, obtaining history from patient or surrogate, ordering and performing treatments and interventions, ordering and review of laboratory studies, ordering and review of radiographic studies, pulse oximetry and re-evaluation of patient's condition.  Tessie Fass MSN, AGACNP-BC [facility] Amion for pager  05/27/2023, 2:42 PM

## 2023-05-27 NOTE — Consult Note (Signed)
OBSTETRICS AND GYNECOLOGY ATTENDING CONSULT NOTE  Consult Date: 05/27/2023  Reason for Consult: initial surgery completed by Dr. Donavan Foil 7/20- Diagnostic laparoscopy converted to laparotomy due pelvic pain/ovarian torsion and pelvic adhesions requiring general surgery due to adhesions and serosal tear Consulting Provider: Dr. Myna Hidalgo    Assessment/Plan: -Postop ileus -Hypoglycemia -Fascial dehiscence  Plan for NPO and NG tube Due to acute encephalopathy from hypoglycemia pt was admitted to ICU- management per critical care team General surgery on board with plan to return to OR for repair of dehiscence, see Dr. Eliot Ford note     Appreciate care of Maria [name] by her primary team  Please call [phone] [facility] OB[facility]N Consult Attending Monday-Friday 8am - 5pm) or [phone] [facility] OB/GYN Attending On Call all day, every day) for any gynecologic concerns at any time.  Thank you for involving [place] in the care of this patient.  Total consultation time including face-to-face time with patient (>50% of time), reviewing chart and documentation: 20 minutes  Myna Hidalgo, DO Attending Obstetrician & Gynecologist, [facility], [facility]   History of Present Illness: Maria [name] is an 42 y.o. PH female who presented due to persistent nausea/vomiting with worsening of symptoms including constipation and abdominal pain.  Some chills, no fevers.  Some fatigue.  No CP or SOB.    Pertinent OB/GYN History: 2016- TAH, BS 2020- Diagnostic laparoscopy converted to exploratory laparotomy, extensive lysis of adhesions (general surgery)   Patient Active Problem List   Diagnosis Date Noted   Ileus (HCC) 05/27/2023   Pelvic pain in female 05/19/2023   Chronic migraine 08/21/2018   Memory loss 08/21/2018   Uterine leiomyoma 05/27/2015    Past Medical History:  Diagnosis Date   Acid reflux    Chronic kidney disease    kidney stones     Diabetes mellitus without complication (HCC)    Dysmenorrhea    Fibroids    Headache    Kidney stone     Past Surgical History:  Procedure Laterality Date   ABDOMINAL HYSTERECTOMY N/A 05/27/2015   Procedure: HYSTERECTOMY ABDOMINAL;  Surgeon: Myna Hidalgo, DO;  Location: [facility];  Service: Gynecology;  Laterality: N/A;  669.1g    BILATERAL SALPINGECTOMY Bilateral 05/27/2015   Procedure: BILATERAL SALPINGECTOMY;  Surgeon: Myna Hidalgo, DO;  Location: [facility];  Service: Gynecology;  Laterality: Bilateral;   CESAREAN SECTION     LAPAROSCOPY N/A 05/20/2023   Procedure: LAPAROSCOPY DIAGNOSTIC;  Surgeon: Warden Fillers, MD;  Location: [facility] OR;  Service: Gynecology;  Laterality: N/A;   LAPAROTOMY  05/20/2023   Procedure: EXPLORATORY LAPAROTOMY WITH LYSIS OF ADHESIONS, SEROSA REPAIR, EVACUATION OF HEMMORHAGIC OVARIAN CYST;  Surgeon: Warden Fillers, MD;  Location: [facility] OR;  Service: Gynecology;;   LITHOTRIPSY      Family History  Problem Relation Age of Onset   Diabetes Mother    Hypertension Mother    Heart attack Mother    Healthy Father     Social History:  reports that she has been smoking cigarettes. She has never used smokeless tobacco. She reports current alcohol use. She reports that she does not use drugs.  Allergies: No Known Allergies  Current Outpatient Medications  Medication Instructions   albuterol (PROVENTIL HFA;VENTOLIN HFA) 108 (90 BASE) MCG/ACT inhaler 1-2 puffs, Inhalation, Every 6 hours PRN   atorvastatin (LIPITOR) 10 mg, Oral, Daily   escitalopram (LEXAPRO) 20 mg, Oral, Daily   fluticasone (FLONASE) 50 MCG/ACT nasal spray 1 spray,  Each Nare, As needed   glyBURIDE (DIABETA) 5 mg, Oral, 2 times daily with meals   hydrOXYzine (ATARAX) 25 mg, Oral, Every 8 hours PRN   ibuprofen (ADVIL) 600 mg, Oral, Every 6 hours PRN   ondansetron (ZOFRAN-ODT) 4 mg, Oral, Every 8 hours PRN   oxyCODONE (OXY IR/ROXICODONE) 5 mg, Oral, Every 6 hours PRN   Trulicity 1.5 mg,  Subcutaneous, Weekly, Sundays      Review of Systems: Pertinent items are noted in HPI.  Focused Physical Examination: BP (!) 141/93 (BP Location: Left Arm)   Pulse 100   Temp 99.3 F (37.4 C) (Oral)   Resp (!) 26   Ht 4' 9.01" (1.448 m)   Wt 72.6 kg   LMP 05/12/2015 (Exact Date)   SpO2 97%   BMI 34.61 kg/m  CONSTITUTIONAL: appears ill, no acute distress NECK: Normal range of motion, supple, no masses.  Normal thyroid.  SKIN: Skin is warm and dry. Not diaphoretic. No erythema. No pallor. NEUROLGIC: Alert and oriented to person, place, and time.  PSYCHIATRIC: Normal mood and affect. Normal behavior. Normal judgment and thought content. CARDIOVASCULAR: Normal heart rate noted, regular rhythm RESPIRATORY: Clear to auscultation bilaterally. Effort and breath sounds normal, no problems with respiration noted. ABDOMEN: + distension with diffuse tenderness, some erythema noted around incision specifically lower transverse.  Staples intact no drainage appreciated PELVIC: Deferred MUSCULOSKELETAL: No edema, no calf tenderness bilaterally  Labs and Imaging: Results for orders placed or performed during the hospital encounter of 05/27/23 (from the past 72 hour(s))  Glucose, capillary     Status: Abnormal   Collection Time: 05/27/23 12:28 PM  Result Value Ref Range   Glucose-Capillary 18 (LL) 70 - 99 mg/dL    Comment: Glucose reference range applies only to samples taken after fasting for at least 8 hours.  CBC     Status: Abnormal   Collection Time: 05/27/23 12:43 PM  Result Value Ref Range   WBC 15.4 (H) 4.0 - 10.5 K/uL   RBC 5.19 (H) 3.87 - 5.11 MIL/uL   Hemoglobin 14.9 12.0 - 15.0 g/dL   HCT 16.1 09.6 - 04.5 %   MCV 83.0 80.0 - 100.0 fL   MCH 28.7 26.0 - 34.0 pg   MCHC 34.6 30.0 - 36.0 g/dL   RDW 40.9 81.1 - 91.4 %   Platelets 426 (H) 150 - 400 K/uL   nRBC 0.0 0.0 - 0.2 %    Comment: Performed at [facility], [address], [place], [place] [address]  Comprehensive  metabolic panel     Status: Abnormal   Collection Time: 05/27/23 12:43 PM  Result Value Ref Range   Sodium 139 135 - 145 mmol/L   Potassium 2.9 (L) 3.5 - 5.1 mmol/L   Chloride 96 (L) 98 - 111 mmol/L   CO2 31 22 - 32 mmol/L   Glucose, Bld 30 (LL) 70 - 99 mg/dL    Comment: CRITICAL RESULT CALLED TO, READ BACK BY AND VERIFIED WITH K MONTGOMERY RN AT [phone] BY D LONG Glucose reference range applies only to samples taken after fasting for at least 8 hours.    BUN 5 (L) 6 - 20 mg/dL   Creatinine, Ser 6.57 0.44 - 1.00 mg/dL   Calcium 9.6 8.9 - 84.6 mg/dL   Total Protein 8.0 6.5 - 8.1 g/dL   Albumin 4.0 3.5 - 5.0 g/dL   AST 36 15 - 41 U/L   ALT 46 (H) 0 - 44 U/L  Alkaline Phosphatase 69 38 - 126 U/L   Total Bilirubin 0.6 0.3 - 1.2 mg/dL   GFR, Estimated >52 >84 mL/min    Comment: (NOTE) Calculated using the CKD-EPI Creatinine Equation (2021)    Anion gap 12 5 - 15    Comment: Performed at [facility], [address], [place], [place] [address]  Glucose, capillary     Status: Abnormal   Collection Time: 05/27/23 12:49 PM  Result Value Ref Range   Glucose-Capillary 31 (LL) 70 - 99 mg/dL    Comment: Glucose reference range applies only to samples taken after fasting for at least 8 hours.  Glucose, capillary     Status: Abnormal   Collection Time: 05/27/23  1:14 PM  Result Value Ref Range   Glucose-Capillary 42 (LL) 70 - 99 mg/dL    Comment: Glucose reference range applies only to samples taken after fasting for at least 8 hours.   Comment 1 Notify RN   Blood gas, arterial     Status: Abnormal   Collection Time: 05/27/23  1:20 PM  Result Value Ref Range   FIO2 21.00 %   Delivery systems ROOM AIR    pH, Arterial 7.53 (H) 7.35 - 7.45   pCO2 arterial 41 32 - 48 mmHg   pO2, Arterial 79 (L) 83 - 108 mmHg   Bicarbonate 34.3 (H) 20.0 - 28.0 mmol/L   Acid-Base Excess 10.5 (H) 0.0 - 2.0 mmol/L   O2 Saturation 97.9 %   Patient temperature 37.0    Collection site RIGHT RADIAL     Drawn by [id number]    Allens test (pass/fail) PASS PASS    Comment: Performed at [facility], [address], [place], [place] [address]  Glucose, capillary     Status: Abnormal   Collection Time: 05/27/23  1:39 PM  Result Value Ref Range   Glucose-Capillary 245 (H) 70 - 99 mg/dL    Comment: Glucose reference range applies only to samples taken after fasting for at least 8 hours.  Glucose, capillary     Status: Abnormal   Collection Time: 05/27/23  2:10 PM  Result Value Ref Range   Glucose-Capillary 149 (H) 70 - 99 mg/dL    Comment: Glucose reference range applies only to samples taken after fasting for at least 8 hours.  Glucose, capillary     Status: None   Collection Time: 05/27/23  3:05 PM  Result Value Ref Range   Glucose-Capillary 99 70 - 99 mg/dL    Comment: Glucose reference range applies only to samples taken after fasting for at least 8 hours.  Glucose, capillary     Status: Abnormal   Collection Time: 05/27/23  4:36 PM  Result Value Ref Range   Glucose-Capillary 27 (LL) 70 - 99 mg/dL    Comment: Glucose reference range applies only to samples taken after fasting for at least 8 hours.  Glucose, capillary     Status: Abnormal   Collection Time: 05/27/23  5:01 PM  Result Value Ref Range   Glucose-Capillary 178 (H) 70 - 99 mg/dL    Comment: Glucose reference range applies only to samples taken after fasting for at least 8 hours.  Glucose, capillary     Status: Abnormal   Collection Time: 05/27/23  5:30 PM  Result Value Ref Range   Glucose-Capillary 142 (H) 70 - 99 mg/dL    Comment: Glucose reference range applies only to samples taken after fasting for at least 8 hours.  CT ABDOMEN PELVIS W CONTRAST  Result Date: 05/27/2023 CLINICAL DATA:  Acute generalized abdominal pain EXAM: CT ABDOMEN AND PELVIS WITH CONTRAST TECHNIQUE: Multidetector CT imaging of the abdomen and pelvis was performed using the standard protocol following bolus administration of intravenous  contrast. RADIATION DOSE REDUCTION: This exam was performed according to the departmental dose-optimization program which includes automated exposure control, adjustment of the mA and/or kV according to patient size and/or use of iterative reconstruction technique. CONTRAST:  75mL OMNIPAQUE IOHEXOL 350 MG/ML SOLN COMPARISON:  May 19, 2023. FINDINGS: Lower chest: No acute abnormality. Hepatobiliary: No focal liver abnormality is seen. No gallstones, gallbladder wall thickening, or biliary dilatation. Pancreas: Unremarkable. No pancreatic ductal dilatation or surrounding inflammatory changes. Spleen: Normal in size without focal abnormality. Adrenals/Urinary Tract: Adrenal glands are unremarkable. Kidneys are normal, without renal calculi, focal lesion, or hydronephrosis. Bladder is unremarkable. Stomach/Bowel: There is now noted moderate to severe gastric distention as well as moderate to severe small bowel dilatation due to interval development of large ventral hernia seen anteriorly in the pelvis. This hernia contains multiple loops of small bowel, several which are dilated. This appears to be resulting at least partial obstruction of the more proximal small bowel loops. The colon is unremarkable and contains stool. The appendix is unremarkable. 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 No other vascular abnormality is noted. No adenopathy is noted. Reproductive: Status post hysterectomy and bilateral salpingectomy. No definite adnexal abnormality is noted currently. Other: No definite ascites is noted. Postsurgical staples are seen involving the lower abdominal wall and along the inferior margin of previously described large hernia. Musculoskeletal: No acute or significant osseous findings. IMPRESSION: There is interval development of large ventral hernia anteriorly in the pelvis which contains multiple loops of  small bowel, several of which are dilated. This appears to be resulting in at least partial obstruction of the more proximal small bowel loops, as well as moderate to severe gastric distention. There are noted surgical clips involving the lower anterior abdominal wall in the vicinity of the hernia. Electronically Signed   By: Lupita Raider M.D.   On: 05/27/2023 15:15   CT HEAD WO CONTRAST ( )  Result Date: 05/27/2023 CLINICAL DATA:  Delirium. EXAM: CT HEAD WITHOUT CONTRAST TECHNIQUE: Contiguous axial images were obtained from the base of the skull through the vertex without intravenous contrast. RADIATION DOSE REDUCTION: This exam was performed according to the departmental dose-optimization program which includes automated exposure control, adjustment of the mA and/or kV according to patient size and/or use of iterative reconstruction technique. COMPARISON:  05/25/2014 FINDINGS: Brain: No evidence of intracranial hemorrhage, acute infarction, hydrocephalus, extra-axial collection, or mass lesion/mass effect. Vascular:  No hyperdense vessel or other acute findings. Skull: No evidence of fracture or other significant bone abnormality. Sinuses/Orbits:  No acute findings. Other: None. IMPRESSION: Negative. Electronically Signed   By: Danae Orleans M.D.   On: 05/27/2023 15:09   DG Abdomen 1 View  Result Date: 05/27/2023 CLINICAL DATA:  Nausea and vomiting EXAM: ABDOMEN - 1 VIEW COMPARISON:  None Available. FINDINGS: Multiple dilated loops of small bowel throughout the midabdomen. There is gas and stool in the colon. Surgical changes along the pelvis. Films are under penetrated. No obvious free air on these portable supine radiographs. The diaphragm is clipped off the edge of the film. IMPRESSION: Surgical changes along the pelvis. Multiple dilated loops of bowel in the midabdomen. Ileus versus obstruction is  possible. Please correlate with clinical presentation and if needed follow up IV contrast CT scan when  appropriate Electronically Signed   By: Karen Kays M.D.   On: 05/27/2023 13:49   DG CHEST PORT 1 VIEW  Result Date: 05/27/2023 CLINICAL DATA:  Hypoxemia.  Nausea and vomiting EXAM: PORTABLE CHEST 1 VIEW COMPARISON:  X-ray 05/31/2018 FINDINGS: No consolidation, pneumothorax or effusion. No edema. Normal cardiopericardial silhouette. IMPRESSION: No acute cardiopulmonary disease. Electronically Signed   By: Karen Kays M.D.   On: 05/27/2023 13:43

## 2023-05-27 NOTE — MAU Note (Signed)
.  Maria [name] is a 43 y.o. at Unknown here in MAU reporting: had surgery the other day and has been consistently vomiting since last night. Patient is diabetic and very disoriented. Accompanied by sister who states patients BS at home was 74.  Pain score: 0 Vitals:   05/27/23 1222  BP: (!) 161/74  Pulse: (!) 103  Resp: 17

## 2023-05-27 NOTE — Op Note (Signed)
Date: 05/27/23  Patient: Maria [name] MRN: [id number]  Preoperative Diagnosis: Incisional hernia with bowel obstruction secondary to fascial dehiscence Postoperative Diagnosis: Same  Procedure:  Reopening of recent laparotomy Reduction of incisional hernia Fascial closure and placement of a Vicryl mesh underlay  Surgeon: Sophronia Simas, MD Assistant: Myna Hidalgo, DO  EBL: 50 mL  Anesthesia: General endotracheal  Specimens: None  Indications: Maria [name] is a 42 yo female who underwent laparotomy 7 days ago for a hemorrhagic ovarian cyst. Over the last few days she has had progressive abdominal pain, drainage from her incision, nausea and vomiting. She presented to the MAU today and a CT scan showed dehiscence of the fascia with herniation of small bowel into the subcutaneous space, with an associated small bowel obstruction. She was brought to the OR emergently for exploration.  Findings: Complete fascial dehiscence of the previous Pfannenstiel incision with herniation of multiple loops of small bowel, which were adherent to the fascial edges leading to small bowel obstruction. The bowel was viable without signs of ischemia. The fascial defect measured approximately 10x8cm and was closed with permanent interrupted suture over a Vicryl mesh underlay. 19-Fr JP drain was left in the subcutaneous space.  Procedure details: Informed consent was obtained in the preoperative area prior to the procedure. The patient was brought to the operating room and placed on the table in the supine position. General anesthesia was induced and appropriate lines and drains were placed for intraoperative monitoring. Perioperative antibiotics were administered per SCIP guidelines. The abdomen was prepped and draped in the usual sterile fashion. A pre-procedure timeout was taken verifying patient identity, surgical site and procedure to be performed.  The staples were removed from the previous incision, and the  subcutaneous tissue was bluntly spread. There was small bowel just deep to the skin in the subcutaneous tissue. This was pink and well-perfused with no signs of ischemia. The bowel was adherent to the fascial edges, and was very gently separated using irrigation and blunt finger dissection. There was a small serosal tear, which was oversewn with 3-0 Vicryl Lembert sutures. The remainder of the herniated bowel was examined and was free of injuries. Once the fascial edges were completely freed, the bowel was reduced back into the abdomen. There was also omentum that had herniated, which was adherent superiorly to the subcutaneous tissue. This was bluntly separated from the subcutaneous tissue and reduced. The fascial edges were palpated, and there was some bowel adherent to the abdominal wall deep to the inferior fascial edge. This was gently freed using blunt finger dissection. The remaining fascial edges were free of adhesions. The fascial edges were grasped with Kocher clamps and could be re-approximated, however there was some tension and the fascial edges were friable. I felt the patient was at high risk for a recurrent dehiscence, so the decision was made to place an absorbable mesh underlay to prevent evisceration and create a fascial bridge in the event of a recurrent dehiscence. A sheet of Vicryl mesh was brought onto the field and cut to size to fit the defect with significant overlap with the fascia circumferentially. The fascial defect itself measured 10cm in length by 8cm in width. The mesh was placed as an underlay and secured to the fascia with 0 Vicryl transfacial sutures. The sutures were then pulled up and tied down to pull the mesh flush with the fascia, ensuring that no bowel was caught between the mesh and the abdominal wall. The sutures were then tied down. The  fascia was then closed at midline over the mesh with interrupted 1 Novafil figure-of-eight sutures. The subcutaneous space was very  large, so a 19-Fr JP drain was placed in the subcutaneous tissue to help minimize seroma formation. The subcutaneous tissue was then closed in 2 layers over the drain using interrupted 0 Vicryl sutures. The skin was closed with staples and a honeycomb dressing was applied.  The patient tolerated the procedure well with no apparent complications. All counts were correct x2 at the end of the procedure. The patient was extubated and taken to PACU in stable condition.  Sophronia Simas, MD 05/27/23 6:21 PM

## 2023-05-27 NOTE — Progress Notes (Signed)
eLink Physician-Brief Progress Note Patient Name: Maria [name] DOB: 04-Jul-1981 MRN: [id number]   Date of Service  05/27/2023  HPI/Events of Note  42 year old female admitted to ICU for hypoglycemia related to sulfonylurea use.  She is postop incisional hernia repair.  Complaining of 10 out of 10 pain after receiving a milligram of Dilaudid IV every 2 hours.  eICU Interventions  Patient's chart reviewed.  Labs and pertinent imaging studies reviewed. Video assessment done.  Still complaining of 10 out of 10 pain.  I observed her to get the milligram of IV Dilaudid even though she stated that she still had pain, she would get drowsy and hypoventilate.  I will place her on demand only PCA for pain control.  I explained this in detail to patient and mother who was at bedside. Bedside nurse was present for the entire interaction.     Intervention Category Intermediate Interventions: Pain - evaluation and management  Carilyn Goodpasture 05/27/2023, 10:10 PM  Addendum: Patient was again hypoglycemic to 69.  Treated with dextrose infusion.  Will change IV fluids from D5 normal saline to D10 normal saline.  Discussed with bedside nurse.  Addendum: Patient's heart rate is in the 140s.  It is unclear if this is related to pain because she rates her pain still out of 10 out of 10 with the PCA.  Earlier when she had 10 out of 10 pain, her heart rate was normal so I do not know if this can be attributed to pain.  Urine output overnight has been 200 cc.  Suspect there could be a component of volume depletion. Will give her a bolus of fluids to see if it helps her heart rate. Will also give her a dose of ketorolac IV because she obviously is very sensitive to opiates which makes her drowsy even though her pain is not controlled. Discussed with bedside nurse.  Aubrina Nieman 5:16 AM  Pain is now a 4 out of 10 after receiving Toradol.  Heart rate is also down to the 1 teens and 120s.  Will schedule Toradol  every 6 hours for the next 48 hours in addition to PCA.

## 2023-05-27 NOTE — Consult Note (Signed)
Maria [name] Oct 27, 1981  [id number].    Requesting MD: Dr. Lanae Crumbly Chief Complaint/Reason for Consult: bowel obstruction  HPI:  Maria [name] is a 42 yo female who presented to the MAU today with abdominal pain, nausea, vomiting and hypoglycemia. She was taken to the OR on 7/20 by gyn (Dr. Donavan Foil) for exploration due to concern for left ovarian torsion. Intraoperatively she was found to have extensive intraabdominal adhesions as well as a hemorrhagic ovarian cyst. General surgery was consulted intra-op and Dr. Derrell Lolling performed an adhesiolysis and repair of a serosal tear on the sigmoid colon. The hemorrhagic cyst was evacuated and the fascia was closed with a running PDS suture.  The patient was discharged home on POD5 (two days ago). She had a bowel movement prior to discharge. Since discharge, she says she has had increasing pain at her incision, as well as a foul odor and drainage. Today she has had a lot of bilious vomiting and PO intolerance. She is passing flatus but has not had a bowel movement since discharge from the hospital. Labs are significant for a leukocytosis, hypokalemia, and severe hypoglycemia. General surgery was consulted for suspect ileus vs bowel obstruction.  Patient's previous abdominal surgeries include a C section, bilateral salpingectomy and hysterectomy.   ROS: Review of Systems  Constitutional:  Positive for malaise/fatigue. Negative for chills and fever.  Respiratory:  Negative for shortness of breath.   Cardiovascular:  Negative for chest pain.  Gastrointestinal:  Positive for abdominal pain, constipation, nausea and vomiting.    Family History  Problem Relation Age of Onset   Diabetes Mother    Hypertension Mother    Heart attack Mother    Healthy Father     Past Medical History:  Diagnosis Date   Acid reflux    Chronic kidney disease    kidney stones    Diabetes mellitus without complication (HCC)    Dysmenorrhea    Fibroids    Headache     Kidney stone     Past Surgical History:  Procedure Laterality Date   ABDOMINAL HYSTERECTOMY N/A 05/27/2015   Procedure: HYSTERECTOMY ABDOMINAL;  Surgeon: Myna Hidalgo, DO;  Location: [facility];  Service: Gynecology;  Laterality: N/A;  669.1g    BILATERAL SALPINGECTOMY Bilateral 05/27/2015   Procedure: BILATERAL SALPINGECTOMY;  Surgeon: Myna Hidalgo, DO;  Location: [facility];  Service: Gynecology;  Laterality: Bilateral;   CESAREAN SECTION     LAPAROSCOPY N/A 05/20/2023   Procedure: LAPAROSCOPY DIAGNOSTIC;  Surgeon: Warden Fillers, MD;  Location: [facility] OR;  Service: Gynecology;  Laterality: N/A;   LAPAROTOMY  05/20/2023   Procedure: EXPLORATORY LAPAROTOMY WITH LYSIS OF ADHESIONS, SEROSA REPAIR, EVACUATION OF HEMMORHAGIC OVARIAN CYST;  Surgeon: Warden Fillers, MD;  Location: [facility] OR;  Service: Gynecology;;   LITHOTRIPSY      Social History:  reports that she has been smoking cigarettes. She has never used smokeless tobacco. She reports current alcohol use. She reports that she does not use drugs.  Allergies: No Known Allergies  Medications Prior to Admission  Medication Sig Dispense Refill   albuterol (PROVENTIL HFA;VENTOLIN HFA) 108 (90 BASE) MCG/ACT inhaler Inhale 1-2 puffs into the lungs every 6 (six) hours as needed for wheezing or shortness of breath. 1 Inhaler 0   atorvastatin (LIPITOR) 10 MG tablet Take 10 mg by mouth daily.     escitalopram (LEXAPRO) 20 MG tablet Take 20 mg by mouth daily.     fluticasone (FLONASE) 50 MCG/ACT nasal spray Place 1 spray into  both nostrils as needed for allergies.     glyBURIDE (DIABETA) 5 MG tablet Take 1 tablet (5 mg total) by mouth 2 (two) times daily with a meal. 30 tablet 1   hydrOXYzine (ATARAX) 25 MG tablet Take 25 mg by mouth every 8 (eight) hours as needed (sleep).     ibuprofen (ADVIL) 600 MG tablet Take 1 tablet (600 mg total) by mouth every 6 (six) hours as needed. 60 tablet 1   ondansetron (ZOFRAN-ODT) 4 MG disintegrating tablet Take 1 tablet  (4 mg total) by mouth every 8 (eight) hours as needed for nausea. 40 tablet 0   oxyCODONE (OXY IR/ROXICODONE) 5 MG immediate release tablet Take 1 tablet (5 mg total) by mouth every 6 (six) hours as needed for moderate pain. 30 tablet 0   TRULICITY 1.5 MG/0.5ML SOPN Inject 1.5 mg into the skin once a week. Sundays       Physical Exam: Blood pressure (!) 133/101, pulse 92, resp. rate 17, last menstrual period 05/12/2015, SpO2 98%. General: resting in bed, appears uncomfortable Neurological: alert and oriented, no focal deficits, cranial nerves grossly in tact HEENT: normocephalic, atraumatic, oropharynx clear, no scleral icterus CV: regular rate and rhythm Respiratory: normal work of breathing on room air Abdomen: distended, lower midline incision has mild ecchymosis and is firm and tender to palpation. Extremities: warm and well-perfused, no deformities, moving all extremities spontaneously Psychiatric: normal mood and affect Skin: warm and dry, no jaundice, no rashes or lesions   Results for orders placed or performed during the hospital encounter of 05/27/23 (from the past 48 hour(s))  Glucose, capillary     Status: Abnormal   Collection Time: 05/27/23 12:28 PM  Result Value Ref Range   Glucose-Capillary 18 (LL) 70 - 99 mg/dL    Comment: Glucose reference range applies only to samples taken after fasting for at least 8 hours.  CBC     Status: Abnormal   Collection Time: 05/27/23 12:43 PM  Result Value Ref Range   WBC 15.4 (H) 4.0 - 10.5 K/uL   RBC 5.19 (H) 3.87 - 5.11 MIL/uL   Hemoglobin 14.9 12.0 - 15.0 g/dL   HCT 03.4 74.2 - 59.5 %   MCV 83.0 80.0 - 100.0 fL   MCH 28.7 26.0 - 34.0 pg   MCHC 34.6 30.0 - 36.0 g/dL   RDW 63.8 75.6 - 43.3 %   Platelets 426 (H) 150 - 400 K/uL   nRBC 0.0 0.0 - 0.2 %    Comment: Performed at [facility], [address], [place], [place] [address]  Comprehensive metabolic panel     Status: Abnormal   Collection Time: 05/27/23 12:43 PM   Result Value Ref Range   Sodium 139 135 - 145 mmol/L   Potassium 2.9 (L) 3.5 - 5.1 mmol/L   Chloride 96 (L) 98 - 111 mmol/L   CO2 31 22 - 32 mmol/L   Glucose, Bld 30 (LL) 70 - 99 mg/dL    Comment: CRITICAL RESULT CALLED TO, READ BACK BY AND VERIFIED WITH K MONTGOMERY RN AT [id number] BY D LONG Glucose reference range applies only to samples taken after fasting for at least 8 hours.    BUN 5 (L) 6 - 20 mg/dL   Creatinine, Ser 6.30 0.44 - 1.00 mg/dL   Calcium 9.6 8.9 - 16.0 mg/dL   Total Protein 8.0 6.5 - 8.1 g/dL   Albumin 4.0 3.5 - 5.0 g/dL   AST 36 15 - 41 U/L  ALT 46 (H) 0 - 44 U/L   Alkaline Phosphatase 69 38 - 126 U/L   Total Bilirubin 0.6 0.3 - 1.2 mg/dL   GFR, Estimated >19 >14 mL/min    Comment: (NOTE) Calculated using the CKD-EPI Creatinine Equation (2021)    Anion gap 12 5 - 15    Comment: Performed at [facility], [address], [place], [place] [address]  Glucose, capillary     Status: Abnormal   Collection Time: 05/27/23 12:49 PM  Result Value Ref Range   Glucose-Capillary 31 (LL) 70 - 99 mg/dL    Comment: Glucose reference range applies only to samples taken after fasting for at least 8 hours.  Glucose, capillary     Status: Abnormal   Collection Time: 05/27/23  1:14 PM  Result Value Ref Range   Glucose-Capillary 42 (LL) 70 - 99 mg/dL    Comment: Glucose reference range applies only to samples taken after fasting for at least 8 hours.   Comment 1 Notify RN   Blood gas, arterial     Status: Abnormal   Collection Time: 05/27/23  1:20 PM  Result Value Ref Range   FIO2 21.00 %   Delivery systems ROOM AIR    pH, Arterial 7.53 (H) 7.35 - 7.45   pCO2 arterial 41 32 - 48 mmHg   pO2, Arterial 79 (L) 83 - 108 mmHg   Bicarbonate 34.3 (H) 20.0 - 28.0 mmol/L   Acid-Base Excess 10.5 (H) 0.0 - 2.0 mmol/L   O2 Saturation 97.9 %   Patient temperature 37.0    Collection site RIGHT RADIAL    Drawn by [id number]    Allens test (pass/fail) PASS PASS    Comment:  Performed at [facility], [address], [place], [place] [address]  Glucose, capillary     Status: Abnormal   Collection Time: 05/27/23  1:39 PM  Result Value Ref Range   Glucose-Capillary 245 (H) 70 - 99 mg/dL    Comment: Glucose reference range applies only to samples taken after fasting for at least 8 hours.  Glucose, capillary     Status: Abnormal   Collection Time: 05/27/23  2:10 PM  Result Value Ref Range   Glucose-Capillary 149 (H) 70 - 99 mg/dL    Comment: Glucose reference range applies only to samples taken after fasting for at least 8 hours.  Glucose, capillary     Status: None   Collection Time: 05/27/23  3:05 PM  Result Value Ref Range   Glucose-Capillary 99 70 - 99 mg/dL    Comment: Glucose reference range applies only to samples taken after fasting for at least 8 hours.   CT ABDOMEN PELVIS W CONTRAST  Result Date: 05/27/2023 CLINICAL DATA:  Acute generalized abdominal pain EXAM: CT ABDOMEN AND PELVIS WITH CONTRAST TECHNIQUE: Multidetector CT imaging of the abdomen and pelvis was performed using the standard protocol following bolus administration of intravenous contrast. RADIATION DOSE REDUCTION: This exam was performed according to the departmental dose-optimization program which includes automated exposure control, adjustment of the mA and/or kV according to patient size and/or use of iterative reconstruction technique. CONTRAST:  75mL OMNIPAQUE IOHEXOL 350 MG/ML SOLN COMPARISON:  May 19, 2023. FINDINGS: Lower chest: No acute abnormality. Hepatobiliary: No focal liver abnormality is seen. No gallstones, gallbladder wall thickening, or biliary dilatation. Pancreas: Unremarkable. No pancreatic ductal dilatation or surrounding inflammatory changes. Spleen: Normal in size without focal abnormality. Adrenals/Urinary Tract: Adrenal glands are unremarkable. Kidneys are normal, without renal calculi,  focal lesion, or hydronephrosis. Bladder is unremarkable. Stomach/Bowel: There  is now noted moderate to severe gastric distention as well as moderate to severe small bowel dilatation due to interval development of large ventral hernia seen anteriorly in the pelvis. This hernia contains multiple loops of small bowel, several which are dilated. This appears to be resulting at least partial obstruction of the more proximal small bowel loops. The colon is unremarkable and contains stool. The appendix is unremarkable. 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 No other vascular abnormality is noted. No adenopathy is noted. Reproductive: Status post hysterectomy and bilateral salpingectomy. No definite adnexal abnormality is noted currently. Other: No definite ascites is noted. Postsurgical staples are seen involving the lower abdominal wall and along the inferior margin of previously described large hernia. Musculoskeletal: No acute or significant osseous findings. IMPRESSION: There is interval development of large ventral hernia anteriorly in the pelvis which contains multiple loops of small bowel, several of which are dilated. This appears to be resulting in at least partial obstruction of the more proximal small bowel loops, as well as moderate to severe gastric distention. There are noted surgical clips involving the lower anterior abdominal wall in the vicinity of the hernia. Electronically Signed   By: Lupita Raider M.D.   On: 05/27/2023 15:15   CT HEAD WO CONTRAST ( )  Result Date: 05/27/2023 CLINICAL DATA:  Delirium. EXAM: CT HEAD WITHOUT CONTRAST TECHNIQUE: Contiguous axial images were obtained from the base of the skull through the vertex without intravenous contrast. RADIATION DOSE REDUCTION: This exam was performed according to the departmental dose-optimization program which includes automated exposure control, adjustment of the mA and/or kV according to patient size and/or use of  iterative reconstruction technique. COMPARISON:  05/25/2014 FINDINGS: Brain: No evidence of intracranial hemorrhage, acute infarction, hydrocephalus, extra-axial collection, or mass lesion/mass effect. Vascular:  No hyperdense vessel or other acute findings. Skull: No evidence of fracture or other significant bone abnormality. Sinuses/Orbits:  No acute findings. Other: None. IMPRESSION: Negative. Electronically Signed   By: Danae Orleans M.D.   On: 05/27/2023 15:09   DG Abdomen 1 View  Result Date: 05/27/2023 CLINICAL DATA:  Nausea and vomiting EXAM: ABDOMEN - 1 VIEW COMPARISON:  None Available. FINDINGS: Multiple dilated loops of small bowel throughout the midabdomen. There is gas and stool in the colon. Surgical changes along the pelvis. Films are under penetrated. No obvious free air on these portable supine radiographs. The diaphragm is clipped off the edge of the film. IMPRESSION: Surgical changes along the pelvis. Multiple dilated loops of bowel in the midabdomen. Ileus versus obstruction is possible. Please correlate with clinical presentation and if needed follow up IV contrast CT scan when appropriate Electronically Signed   By: Karen Kays M.D.   On: 05/27/2023 13:49   DG CHEST PORT 1 VIEW  Result Date: 05/27/2023 CLINICAL DATA:  Hypoxemia.  Nausea and vomiting EXAM: PORTABLE CHEST 1 VIEW COMPARISON:  X-ray 05/31/2018 FINDINGS: No consolidation, pneumothorax or effusion. No edema. Normal cardiopericardial silhouette. IMPRESSION: No acute cardiopulmonary disease. Electronically Signed   By: Karen Kays M.D.   On: 05/27/2023 13:43      Assessment/Plan 42 yo female 7 days s/p exploratory laparotomy, evacuation of hemorrhagic ovarian cyst, adhesiolysis and repair of colonic serosal tear, presenting with emesis and abdominal pain. Her CT scan shows a bowel obstruction with fascial dehiscence of the Pfannenstiel, and herniation of multiple loops of small  bowel. I recommended emergent operative  exploration and fascial closure. I reviewed the procedure details with the patient and discussed the possibility of a mesh fascial bridge. She was initially reluctant to have another procedure but I emphasized the risk of bowel ischemia if an intervention is not taken. She consents to proceed with surgery. - NPO, needs NG decompression immediately. Has not had an NG tube placed prior to transfer to preop. Discussed with anesthesia team, planning to place NG in preop prior to induction of anesthesia to minimize aspiration risk. - Proceed to OR. Discussed with on-call gyn Dr. Charlotta Newton. - Patient has been admitted to medical ICU due to persistent severe hypoglycemia - Hypokalemia: replete - General surgery will follow   Sophronia Simas, MD [facility] Surgery General, Hepatobiliary and Pancreatic Surgery 05/27/23 3:18 PM

## 2023-05-27 NOTE — Anesthesia Procedure Notes (Signed)
Procedure Name: Intubation Date/Time: 05/27/2023 4:34 PM  Performed by: Edmonia Caprio, CRNAPre-anesthesia Checklist: Patient identified, Emergency Drugs available, Suction available and Patient being monitored Patient Re-evaluated:Patient Re-evaluated prior to induction Oxygen Delivery Method: Circle system utilized Preoxygenation: Pre-oxygenation with 100% oxygen Induction Type: IV induction, Rapid sequence and Cricoid Pressure applied Laryngoscope Size: Miller and 2 Grade View: Grade I Tube type: Oral Tube size: 7.0 mm Number of attempts: 1 Airway Equipment and Method: Stylet and Oral airway Placement Confirmation: ETT inserted through vocal cords under direct vision, positive ETCO2 and breath sounds checked- equal and bilateral Secured at: 19 cm Tube secured with: Tape Dental Injury: Teeth and Oropharynx as per pre-operative assessment

## 2023-05-27 NOTE — Progress Notes (Addendum)
Pain remains 10/10 after 1 mg IV dilaudid given per order at 2008. Contacted [facility] nurse, Jonny Ruiz, RN to notify.  She keeps groaning out in pain. Family is at bedside.  Also notified elink of K 3.2

## 2023-05-27 NOTE — Significant Event (Signed)
Rapid Response Event Note   Reason for Call :  AMS, CBG 18  Initial Focused Assessment:  Patient A&Ox1 upon initial assessment. Lungs clear/diminished. Heart tones normal. Skin warm, slightly diaphoretic. Abdomen taut and very distended, staples present from recent ex-lap (admitted 7/20-7/25). Minimal if any bowel sounds audible.   161/74 HR 99 RR 21 O2 100% NRB  Per patient, sister and mom at bedside:  Surgery Saturday. Cysts removed Bowel Movement Tuesday/Wednesday  D/C Thursday. Eating lightly normal food  Friday day normal; took night meds 2200-2230 unable to fall asleep. Pain in lower back. Lots of urine, at least every hour. Vomiting started around 0100 without stopping. (Bile) Sister called 0715: still vomiting. Arrived 1100. Attempted to feed eggs, toast and fruit cup, vomited within 20 min. Noticed she was "delirious", checked CBG--38 and brought to MAU.   Interventions:  CXR KUB ABG PCCM consult, to bedside Gen surg consult CBGs: 18, 31, 42*mentation improved, 245, 149 Dextrose gel given PTA roughly 1232 and again 1240. Glucagon IM 1mg  @ 1302 D5LR started once IV obtained with [place] 1300 D50 @ 1318  Plan of Care:  CT abd/pelvis ordered. To ICU. May need OR.  Event Summary:  MD Notified:  Call Time: 1230 Arrival Time: 1232 End Time: 1415  Truddie Crumble, RN

## 2023-05-28 ENCOUNTER — Encounter (HOSPITAL_COMMUNITY): Payer: Self-pay | Admitting: Surgery

## 2023-05-28 ENCOUNTER — Other Ambulatory Visit: Payer: Self-pay

## 2023-05-28 DIAGNOSIS — K56609 Unspecified intestinal obstruction, unspecified as to partial versus complete obstruction: Secondary | ICD-10-CM

## 2023-05-28 DIAGNOSIS — E162 Hypoglycemia, unspecified: Secondary | ICD-10-CM | POA: Diagnosis not present

## 2023-05-28 LAB — GLUCOSE, CAPILLARY
Glucose-Capillary: 97 mg/dL (ref 70–99)
Glucose-Capillary: 113 mg/dL — ABNORMAL HIGH (ref 70–99)
Glucose-Capillary: 115 mg/dL — ABNORMAL HIGH (ref 70–99)
Glucose-Capillary: 138 mg/dL — ABNORMAL HIGH (ref 70–99)
Glucose-Capillary: 170 mg/dL — ABNORMAL HIGH (ref 70–99)
Glucose-Capillary: 198 mg/dL — ABNORMAL HIGH (ref 70–99)
Glucose-Capillary: 201 mg/dL — ABNORMAL HIGH (ref 70–99)
Glucose-Capillary: 142 mg/dL — ABNORMAL HIGH (ref 70–99)
Glucose-Capillary: 153 mg/dL — ABNORMAL HIGH (ref 70–99)

## 2023-05-28 LAB — BASIC METABOLIC PANEL WITH GFR
Anion gap: 10 (ref 5–15)
BUN: 7 mg/dL (ref 6–20)
CO2: 28 mmol/L (ref 22–32)
Calcium: 8.2 mg/dL — ABNORMAL LOW (ref 8.9–10.3)
Chloride: 100 mmol/L (ref 98–111)
Creatinine, Ser: 0.73 mg/dL (ref 0.44–1.00)
GFR, Estimated: >60 mL/min (ref >60)
Glucose, Bld: 141 mg/dL — ABNORMAL HIGH (ref 70–99)
Potassium: 3.6 mmol/L (ref 3.5–5.1)
Sodium: 138 mmol/L (ref 135–145)

## 2023-05-28 LAB — CBC
HCT: 35.5 % — ABNORMAL LOW (ref 36.0–46.0)
Hemoglobin: 12.3 g/dL (ref 12.0–15.0)
MCH: 29.3 pg (ref 26.0–34.0)
MCHC: 34.6 g/dL (ref 30.0–36.0)
MCV: 84.5 fL (ref 80.0–100.0)
Platelets: 323 10*3/uL (ref 150–400)
RBC: 4.2 MIL/uL (ref 3.87–5.11)
RDW: 14 % (ref 11.5–15.5)
WBC: 15.1 10*3/uL — ABNORMAL HIGH (ref 4.0–10.5)
nRBC: 0 % (ref 0.0–0.2)

## 2023-05-28 LAB — MAGNESIUM: Magnesium: 1.7 mg/dL (ref 1.7–2.4)

## 2023-05-28 LAB — PHOSPHORUS: Phosphorus: 5.2 mg/dL — ABNORMAL HIGH (ref 2.5–4.6)

## 2023-05-28 MED ORDER — SODIUM CHLORIDE 0.9% FLUSH
10.0000 mL | Freq: Two times a day (BID) | INTRAVENOUS | Status: DC
  Administered 2023-05-28 – 2023-06-01 (×7): 10 mL
  Administered 2023-06-01: 40 mL
  Administered 2023-06-02 – 2023-06-09 (×13): 10 mL

## 2023-05-28 MED ORDER — FAMOTIDINE IN NACL 20-0.9 MG/50ML-% IV SOLN
20.0000 mg | Freq: Two times a day (BID) | INTRAVENOUS | Status: DC
  Administered 2023-05-28 (×2): 20 mg via INTRAVENOUS
  Filled 2023-05-28 (×3): qty 50

## 2023-05-28 MED ORDER — INSULIN ASPART 100 UNIT/ML IJ SOLN
0.0000 [IU] | Freq: Four times a day (QID) | INTRAMUSCULAR | Status: DC
  Administered 2023-05-28: 2 [IU] via SUBCUTANEOUS
  Administered 2023-05-28: 3 [IU] via SUBCUTANEOUS
  Administered 2023-05-29: 1 [IU] via SUBCUTANEOUS

## 2023-05-28 MED ORDER — SODIUM CHLORIDE 0.9% FLUSH
10.0000 mL | INTRAVENOUS | Status: DC | PRN
  Administered 2023-06-06: 10 mL

## 2023-05-28 MED ORDER — LACTATED RINGERS IV BOLUS
1000.0000 mL | Freq: Once | INTRAVENOUS | Status: AC
  Administered 2023-05-28: 1000 mL via INTRAVENOUS

## 2023-05-28 MED ORDER — KETOROLAC TROMETHAMINE 15 MG/ML IJ SOLN
15.0000 mg | Freq: Four times a day (QID) | INTRAMUSCULAR | Status: DC
  Administered 2023-05-28 – 2023-05-29 (×4): 15 mg via INTRAVENOUS
  Filled 2023-05-28 (×4): qty 1

## 2023-05-28 MED ORDER — KETOROLAC TROMETHAMINE 30 MG/ML IJ SOLN
30.0000 mg | Freq: Once | INTRAMUSCULAR | Status: AC
  Administered 2023-05-28: 30 mg via INTRAVENOUS
  Filled 2023-05-28: qty 1

## 2023-05-28 MED ORDER — MAGNESIUM SULFATE 2 GM/50ML IV SOLN
2.0000 g | Freq: Once | INTRAVENOUS | Status: AC
  Administered 2023-05-28: 2 g via INTRAVENOUS
  Filled 2023-05-28: qty 50

## 2023-05-28 MED ORDER — DEXTROSE IN LACTATED RINGERS 5 % IV SOLN
INTRAVENOUS | Status: AC
  Administered 2023-05-29: 75 mL/h via INTRAVENOUS

## 2023-05-28 MED ORDER — SODIUM CHLORIDE 4 MEQ/ML IV SOLN
INTRAVENOUS | Status: DC
  Filled 2023-05-28 (×3): qty 961.5

## 2023-05-28 MED ORDER — METHOCARBAMOL 1000 MG/10ML IJ SOLN
500.0000 mg | Freq: Three times a day (TID) | INTRAVENOUS | Status: DC
  Administered 2023-05-28 – 2023-05-29 (×4): 500 mg via INTRAVENOUS
  Filled 2023-05-28: qty 5
  Filled 2023-05-28 (×2): qty 500
  Filled 2023-05-28: qty 5
  Filled 2023-05-28 (×2): qty 500

## 2023-05-28 NOTE — Progress Notes (Signed)
    1 Day Post-Op  Subjective: PCA added overnight for pain control. Hypoglycemia improving, on D5 infusion. No flatus.   Objective: Vital signs in last 24 hours: Temp:  [97.8 F (36.6 C)-99.3 F (37.4 C)] 98.5 F (36.9 C) (07/28 0700) Pulse Rate:  [87-139] 115 (07/28 0630) Resp:  [11-26] 16 (07/28 0630) BP: (107-161)/(74-110) 107/87 (07/28 0630) SpO2:  [96 %-100 %] 98 % (07/28 0630) FiO2 (%):  [0 %] 0 % (07/27 2328) Weight:  [72.6 kg-83.6 kg] 83.6 kg (07/28 0437)    Intake/Output from previous day: 07/27 0701 - 07/28 0700 In: 3656.7 [I.V.:2072.7; IV Piggyback:1584] Out: 2380 [Urine:520; Drains:110; Blood:150] Intake/Output this shift: No intake/output data recorded.  PE: General: resting comfortably, NAD Neuro: alert and oriented, no focal deficits HEENT: NG with bilious drainage Resp: normal work of breathing CV: tachycardic 110s, regular Abdomen: soft, nondistended, appropriately tender. Lower abdominal incision clean with staples in place, honeycomb dressing with scant sanguinous drainage. JP serosanguinous. GU: foley draining yellow urine   Lab Results:  Recent Labs    05/27/23 2018 05/28/23 0010  WBC 14.3* 15.1*  HGB 12.4 12.3  HCT 36.4 35.5*  PLT 335 323   BMET Recent Labs    05/27/23 2018 05/28/23 0010  NA 138 138  K 3.2* 3.6  CL 100 100  CO2 28 28  GLUCOSE 85 141*  BUN 6 7  CREATININE 0.74 0.73  CALCIUM 8.6* 8.2*   PT/INR No results for input(s): "LABPROT", "INR" in the last 72 hours. CMP     Component Value Date/Time   NA 138 05/28/2023 0010   K 3.6 05/28/2023 0010   CL 100 05/28/2023 0010   CO2 28 05/28/2023 0010   GLUCOSE 141 (H) 05/28/2023 0010   BUN 7 05/28/2023 0010   CREATININE 0.73 05/28/2023 0010   CALCIUM 8.2 (L) 05/28/2023 0010   PROT 8.0 05/27/2023 1243   ALBUMIN 4.0 05/27/2023 1243   AST 36 05/27/2023 1243   ALT 46 (H) 05/27/2023 1243   ALKPHOS 69 05/27/2023 1243   BILITOT 0.6 05/27/2023 1243   GFRNONAA >60  05/28/2023 0010   GFRAA >60 05/31/2018 1753   Lipase     Component Value Date/Time   LIPASE 38 05/27/2023 1243      Assessment/Plan 42 yo female presenting with fascial dehiscence of Pfannenstiel incision and small bowel herniation with obstruction. S/p re-exploration, hernia reduction, Vicryl mesh underlay and fascial closure on 7/27. - Remain NPO with NG tube to low intermittent suction. Awaiting return of bowel function. - Keep abdominal binder in place - Ok to mobilize today, must have abdominal binder when out of bed - Multimodal pain control, will add scheduled robaxin. On toradol and dilaudid PCA. - VTE: lovenox - Remainder of care per primary team. General surgery will follow.    LOS: 1 day    Sophronia Simas, MD [facility] Surgery General, Hepatobiliary and Pancreatic Surgery 05/28/23 8:12 AM

## 2023-05-28 NOTE — Progress Notes (Signed)
Peripherally Inserted Central Catheter Placement  The IV Nurse has discussed with the patient and/or persons authorized to consent for the patient, the purpose of this procedure and the potential benefits and risks involved with this procedure.  The benefits include less needle sticks, lab draws from the catheter, and the patient may be discharged home with the catheter. Risks include, but not limited to, infection, bleeding, blood clot (thrombus formation), and puncture of an artery; nerve damage and irregular heartbeat and possibility to perform a PICC exchange if needed/ordered by physician.  Alternatives to this procedure were also discussed.  Bard Power PICC patient education guide, fact sheet on infection prevention and patient information card has been provided to patient /or left at bedside.    PICC Placement Documentation  PICC Double Lumen 05/28/23 Right Basilic 38 cm 2 cm (Active)  Indication for Insertion or Continuance of Line Administration of hyperosmolar/irritating solutions (i.e. TPN, Vancomycin, etc.) 05/28/23 1448  Exposed Catheter (cm) 2 cm 05/28/23 1448  Site Assessment Clean, Dry, Intact 05/28/23 1448  Lumen #1 Status Flushed;Saline locked;Blood return noted 05/28/23 1448  Lumen #2 Status Flushed;Saline locked;Blood return noted 05/28/23 1448  Dressing Type Transparent;Securing device 05/28/23 1448  Dressing Status Antimicrobial disc in place;Clean, Dry, Intact 05/28/23 1448  Safety Lock Not Applicable 05/28/23 1448  Line Care Connections checked and tightened 05/28/23 1448  Line Adjustment (NICU/IV Team Only) No 05/28/23 1448  Dressing Intervention New dressing 05/28/23 1448  Dressing Change Due 06/04/23 05/28/23 1448       Ieisha Gao, Lajean Manes 05/28/2023, 2:49 PM

## 2023-05-28 NOTE — Plan of Care (Signed)
  Problem: Education: Goal: Knowledge of the prescribed therapeutic regimen will improve Outcome: Progressing   Problem: Clinical Measurements: Goal: Will remain free from infection Outcome: Progressing Goal: Diagnostic test results will improve Outcome: Progressing   Problem: Coping: Goal: Level of anxiety will decrease Outcome: Not Progressing

## 2023-05-28 NOTE — Progress Notes (Signed)
PHARMACY - TOTAL PARENTERAL NUTRITION CONSULT NOTE   Indication: Small bowel obstruction and possible ileus  Patient Measurements: Height: 4' 9.01" (144.8 cm) Weight: 83.6 kg (184 lb 4.9 oz) IBW/kg (Calculated) : 38.62 TPN AdjBW (KG): 47.1 Body mass index is 39.87 kg/m. Usual Weight: patient reports she is typically 193 lbs (87.7 kg)  Assessment: 41yo female who presented 7/27 following previous 7/20-25 where she underwent exlap with lysis of adhesions, serosa repair, evacuation of hemorrhagic ovarian cyst. Diet was advanced to normal over course of admission with emesis POD3-4 but overall tolerated with normal bowel sounds, BM and flatus prior to discharge 7/25.   Pt began to experience abdominal distension and cramping 7/26 that progressed to vomiting 7/27 and has been unable to maintain PO intake. Underwent re-exlap 7/27, hernia reduction, Vicryl mesh underlay, fascial closure. Pharmacy is consulted to initiate TPN.   Glucose / Insulin: BG 69-170 postoperatively, hypoglycemic on admission Hx diabetes on glyburide & Trulicity  Electrolytes: K 3.6, Ca 8.2, P 5.2, otherwise WNL Renal: Scr <1, BUN WNL Hepatic: LFTs stable  Intake / Output; MIVF: drain 110, 520 ml UOP documented, net +1.7L GI Imaging: GI Surgeries / Procedures:  Central access: ordered TPN start date:   Nutritional Goals: Awaiting RD assessment  RD Assessment:    Current Nutrition:  NPO  Plan:  Per discussion with MD will hold TPN for 1-2 days and monitor for return of bowel function. Initiate Sensitive q6h SSI and adjust as needed  Adjust NSD10 to D5LR 50 mL/hr at 1800 Monitor TPN labs on Mon/Thurs, PRN once initiated  Knute Neu 05/28/2023,10:50 AM

## 2023-05-28 NOTE — Progress Notes (Signed)
NAME:  Maria [name], MRN:  [id number], DOB:  08/29/1981, LOS: 1 ADMISSION DATE:  05/27/2023, CONSULTATION DATE:  05/27/23 REFERRING MD:  Lanae Crumbly (OB), CHIEF COMPLAINT:  hypoglycemia   History of Present Illness:  42 yo F PMH DM on glyburide, depression, who is POD 7 (Op date 7/20) ex lap evac of L hemorrhagic ovarian cyst, and extensive intraabdominal LOA, sigmoid colon serosal tear repair. She remained in the hospital until dc 7/25. Prior to dc she had + BM and + flatus. On 7/26 in evening pt started to have abd distension, pain. She began vomiting 7/27 approx 0100, and has had ongoing vomiting since. She has not had any add'l BM since leaving hospital. Has tried to eat but vomiting food. She ultimately was brought to MAU by family after they found her 7/27 to be delirious, checked her CBG at home and was hypoglycemic -- 38.   In MAU she remained hypoglycemic requiring dextrose paste, d50 amps, and ultimately d5 gtt. She continued to vomit in MAU. KUB obtained which revealed dilated small bowel loops, concerning for possible ileus.   OB and [facility] were notified of pts arrival.   PCCM is also consulted in this setting   Pertinent  Medical History  DM Hemorrhagic ovarian cyst  Intraabdominal adhesions   Significant Hospital Events: Including procedures, antibiotic start and stop dates in addition to other pertinent events   7/20 - ex lap hemorrhagic ovarian cyst evac + abd LOA + sigmoid colon serosal tear repair. 7/25 dc home 7/27 admit to ICU with hypoglycemia, concern for ileus  Interim History / Subjective:  POD1 ex lap hernia reduction with mesh underlay and fascial closure   On PCA and d10   Objective   Blood pressure 95/71, pulse (!) 132, temperature 100.1 F (37.8 C), temperature source Axillary, resp. rate 15, height 4' 9.01" (1.448 m), weight 83.6 kg, last menstrual period 05/12/2015, SpO2 98%.    FiO2 (%):  [0 %] 0 %   Intake/Output Summary (Last 24 hours) at  05/28/2023 1206 Last data filed at 05/28/2023 1104 Gross per 24 hour  Intake 4210.74 ml  Output 2420 ml  Net 1790.74 ml   Filed Weights   05/27/23 1553 05/28/23 0437  Weight: 72.6 kg 83.6 kg    Examination: General: Ill appearing F NAD  HENT: NCAT pink mm, NGT  Lungs: CTAb symmetrical chest expansion  Cardiovascular: rrr s1s2  Abdomen: soft, abdominal binder in place. JP w serosang drainage  Extremities: no acute joint deformity  Neuro: AAO GU: defer  Resolved Hospital Problem list   Acute encephalopathy   Assessment & Plan:    DM2 w hypoglycemia -most likely poor intake + vomiting + taking home sulfonyurea-- d/w ccm md and its felt that this can take 24+ to resolve  P -weaning d10 as tolerated.  -cont CBG checks   Hernia with bowel obstruction, fascial dehiscence -- s/p ex lap hernia reduction, fascia closure (7/27)  Recent ex lap evac of hemorrhagic ovarian cyst, extensive LOA and sigmoid colon serosal repait (7/20)  P -post op per [facility] -NPO, cont NGT to sxn -PICC, TPN   Leukocytosis  -reactive v infectious, favor reactive  P -defer abx, cont to trend wbc and fever curve   Hypokalemia Hypomagnesemia  -presented w emesis  P -replace K, mag  -AM BMP   Best Practice (right click and "Reselect all SmartList Selections" daily)   Diet/type: NPO DVT prophylaxis: LMWH GI prophylaxis: N/A Lines: N/A Foley:  N/A Code  Status:  full code Last date of multidisciplinary goals of care discussion [7/27]  Labs   CBC: Recent Labs  Lab 05/24/23 0501 05/27/23 1243 05/27/23 1758 05/27/23 2018 05/28/23 0010  WBC 4.8 15.4*  --  14.3* 15.1*  HGB 11.6* 14.9 13.3 12.4 12.3  HCT 34.4* 43.1 39.0 36.4 35.5*  MCV 84.9 83.0  --  84.3 84.5  PLT 200 426*  --  335 323    Basic Metabolic Panel: Recent Labs  Lab 05/24/23 0501 05/27/23 1243 05/27/23 1758 05/27/23 2018 05/28/23 0010  NA 133* 139 140 138 138  K 3.5 2.9* 3.5 3.2* 3.6  CL 98 96* 98 100 100  CO2 26 31   --  28 28  GLUCOSE 162* 30* 96 85 141*  BUN 12 5* 6 6 7   CREATININE 0.86 0.77 0.70 0.74 0.73  CALCIUM 8.1* 9.6  --  8.6* 8.2*  MG 2.1 2.2  --   --  1.7  PHOS  --   --   --   --  5.2*   GFR: Estimated Creatinine Clearance: 82.7 mL/min (by C-G formula based on SCr of 0.73 mg/dL). Recent Labs  Lab 05/24/23 0501 05/27/23 1243 05/27/23 2018 05/28/23 0010  WBC 4.8 15.4* 14.3* 15.1*    Liver Function Tests: Recent Labs  Lab 05/27/23 1243  AST 36  ALT 46*  ALKPHOS 69  BILITOT 0.6  PROT 8.0  ALBUMIN 4.0   Recent Labs  Lab 05/27/23 1243  LIPASE 38   No results for input(s): "AMMONIA" in the last 168 hours.  ABG    Component Value Date/Time   PHART 7.53 (H) 05/27/2023 1320   PCO2ART 41 05/27/2023 1320   PO2ART 79 (L) 05/27/2023 1320   HCO3 34.3 (H) 05/27/2023 1320   TCO2 31 05/27/2023 1758   O2SAT 97.9 05/27/2023 1320     Coagulation Profile: No results for input(s): "INR", "PROTIME" in the last 168 hours.  Cardiac Enzymes: No results for input(s): "CKTOTAL", "CKMB", "CKMBINDEX", "TROPONINI" in the last 168 hours.  HbA1C: No results found for: "HGBA1C"  CBG: Recent Labs  Lab 05/28/23 0309 05/28/23 0506 05/28/23 0707 05/28/23 0900 05/28/23 1103  GLUCAP 113* 115* 138* 170* 198*    CRITICAL CARE Performed by: Lanier Clam   Total critical care time: 35 minutes  Critical care time was exclusive of separately billable procedures and treating other patients. Critical care was necessary to treat or prevent imminent or life-threatening deterioration.  Critical care was time spent personally by me on the following activities: development of treatment plan with patient and/or surrogate as well as nursing, discussions with consultants, evaluation of patient's response to treatment, examination of patient, obtaining history from patient or surrogate, ordering and performing treatments and interventions, ordering and review of laboratory studies, ordering and  review of radiographic studies, pulse oximetry and re-evaluation of patient's condition.  Tessie Fass MSN, AGACNP-BC [facility] Amion for pager  05/28/2023, 12:06 PM

## 2023-05-28 NOTE — Plan of Care (Signed)
Spoke w [facility] physician   PCCM will remain primary at this time.  When pt is appropriate to transfer out of ICU, touch base w OB re who will take over as primary (potentially OB at that time vs [facility], [facility])    Tessie Fass MSN, AGACNP-BC [facility] Pulmonary/Critical Care Medicine 05/28/2023, 10:29 AM

## 2023-05-28 NOTE — Progress Notes (Signed)
[facility] Faculty Practice OB/GYN Attending Progress Note  ADMISSION DIAGNOSIS:   Principal Problem:   Ileus (HCC)   Subjective   Notes that she is just feeling uncomfortable this am.  Notes indigestion.  She reports considerable pain; however, pt dozing intermittently during conversation.  Denies passing flatus.  Denies fever/chills.   Objective  VITALS:  height is 4' 9.01" (1.448 m) and weight is 83.6 kg. Her oral temperature is 98.5 F (36.9 C). Her blood pressure is 107/87 and her pulse is 115 (abnormal). Her respiration is 16 and oxygen saturation is 98%.   EXAMINATION: CONSTITUTIONAL: no acute distress SKIN: warm and dry, non-diaphoretic NEUROLOGIC: Alert and oriented, no focal deficits PSYCHIATRIC: Normal mood and affect. Normal behavior. CARDIOVASCULAR: +tachycardia RESPIRATORY: Effort and breath sounds normal, no problems with respiration noted Abdomen: soft, mild distension.  Some BS noted L more than right.  Appropriately tender.  Incision with honeycomb dressing- scant drainage at lower portion- overall clean and dry.  JP drain in place with serosanguinous flui Ext: SCDs in place, no calf tenderness Foley in place- dark yellow/orange-tinged urine this am  Laboratory Reports: Results for orders placed or performed during the hospital encounter of 05/27/23 (from the past 72 hour(s))  Glucose, capillary     Status: Abnormal   Collection Time: 05/27/23 12:28 PM  Result Value Ref Range   Glucose-Capillary 18 (LL) 70 - 99 mg/dL    Comment: Glucose reference range applies only to samples taken after fasting for at least 8 hours.  CBC     Status: Abnormal   Collection Time: 05/27/23 12:43 PM  Result Value Ref Range   WBC 15.4 (H) 4.0 - 10.5 K/uL   RBC 5.19 (H) 3.87 - 5.11 MIL/uL   Hemoglobin 14.9 12.0 - 15.0 g/dL   HCT 40.9 81.1 - 91.4 %   MCV 83.0 80.0 - 100.0 fL   MCH 28.7 26.0 - 34.0 pg   MCHC 34.6 30.0 - 36.0 g/dL   RDW 78.2 95.6 - 21.3 %    Platelets 426 (H) 150 - 400 K/uL   nRBC 0.0 0.0 - 0.2 %    Comment: Performed at [facility], [address], [place], [place] [address]  Comprehensive metabolic panel     Status: Abnormal   Collection Time: 05/27/23 12:43 PM  Result Value Ref Range   Sodium 139 135 - 145 mmol/L   Potassium 2.9 (L) 3.5 - 5.1 mmol/L   Chloride 96 (L) 98 - 111 mmol/L   CO2 31 22 - 32 mmol/L   Glucose, Bld 30 (LL) 70 - 99 mg/dL    Comment: CRITICAL RESULT CALLED TO, READ BACK BY AND VERIFIED WITH K MONTGOMERY RN AT [id number] BY D LONG Glucose reference range applies only to samples taken after fasting for at least 8 hours.    BUN 5 (L) 6 - 20 mg/dL   Creatinine, Ser 9.52 0.44 - 1.00 mg/dL   Calcium 9.6 8.9 - 84.1 mg/dL   Total Protein 8.0 6.5 - 8.1 g/dL   Albumin 4.0 3.5 - 5.0 g/dL   AST 36 15 - 41 U/L   ALT 46 (H) 0 - 44 U/L   Alkaline Phosphatase 69 38 - 126 U/L   Total Bilirubin 0.6 0.3 - 1.2 mg/dL   GFR, Estimated >32 >44 mL/min    Comment: (NOTE) Calculated using the CKD-EPI Creatinine Equation (2021)    Anion gap 12 5 - 15  Comment: Performed at [facility], [address], [place], [place] [address]  Lipase, blood     Status: None   Collection Time: 05/27/23 12:43 PM  Result Value Ref Range   Lipase 38 11 - 51 U/L    Comment: Performed at [facility], [address] [place], [place] [address]  Magnesium     Status: None   Collection Time: 05/27/23 12:43 PM  Result Value Ref Range   Magnesium 2.2 1.7 - 2.4 mg/dL    Comment: Performed at [facility], [address] [place], [place] [address]  Glucose, capillary     Status: Abnormal   Collection Time: 05/27/23 12:49 PM  Result Value Ref Range   Glucose-Capillary 31 (LL) 70 - 99 mg/dL    Comment: Glucose reference range applies only to samples taken after fasting for at least 8 hours.  Glucose, capillary     Status: Abnormal   Collection Time: 05/27/23  1:14 PM  Result Value Ref Range    Glucose-Capillary 42 (LL) 70 - 99 mg/dL    Comment: Glucose reference range applies only to samples taken after fasting for at least 8 hours.   Comment 1 Notify RN   Blood gas, arterial     Status: Abnormal   Collection Time: 05/27/23  1:20 PM  Result Value Ref Range   FIO2 21.00 %   Delivery systems ROOM AIR    pH, Arterial 7.53 (H) 7.35 - 7.45   pCO2 arterial 41 32 - 48 mmHg   pO2, Arterial 79 (L) 83 - 108 mmHg   Bicarbonate 34.3 (H) 20.0 - 28.0 mmol/L   Acid-Base Excess 10.5 (H) 0.0 - 2.0 mmol/L   O2 Saturation 97.9 %   Patient temperature 37.0    Collection site RIGHT RADIAL    Drawn by [id number]    Allens test (pass/fail) PASS PASS    Comment: Performed at [facility], [address], [place], [place] [address]  Glucose, capillary     Status: Abnormal   Collection Time: 05/27/23  1:39 PM  Result Value Ref Range   Glucose-Capillary 245 (H) 70 - 99 mg/dL    Comment: Glucose reference range applies only to samples taken after fasting for at least 8 hours.  Glucose, capillary     Status: Abnormal   Collection Time: 05/27/23  2:10 PM  Result Value Ref Range   Glucose-Capillary 149 (H) 70 - 99 mg/dL    Comment: Glucose reference range applies only to samples taken after fasting for at least 8 hours.  Glucose, capillary     Status: None   Collection Time: 05/27/23  3:05 PM  Result Value Ref Range   Glucose-Capillary 99 70 - 99 mg/dL    Comment: Glucose reference range applies only to samples taken after fasting for at least 8 hours.  Glucose, capillary     Status: Abnormal   Collection Time: 05/27/23  4:36 PM  Result Value Ref Range   Glucose-Capillary 27 (LL) 70 - 99 mg/dL    Comment: Glucose reference range applies only to samples taken after fasting for at least 8 hours.  Glucose, capillary     Status: Abnormal   Collection Time: 05/27/23  5:01 PM  Result Value Ref Range   Glucose-Capillary 178 (H) 70 - 99 mg/dL    Comment: Glucose reference range applies only to  samples taken after fasting for at least 8 hours.  Glucose, capillary  Status: Abnormal   Collection Time: 05/27/23  5:30 PM  Result Value Ref Range   Glucose-Capillary 142 (H) 70 - 99 mg/dL    Comment: Glucose reference range applies only to samples taken after fasting for at least 8 hours.  Glucose, capillary     Status: None   Collection Time: 05/27/23  5:56 PM  Result Value Ref Range   Glucose-Capillary 93 70 - 99 mg/dL    Comment: Glucose reference range applies only to samples taken after fasting for at least 8 hours.  I-STAT, chem 8     Status: None   Collection Time: 05/27/23  5:58 PM  Result Value Ref Range   Sodium 140 135 - 145 mmol/L   Potassium 3.5 3.5 - 5.1 mmol/L   Chloride 98 98 - 111 mmol/L   BUN 6 6 - 20 mg/dL   Creatinine, Ser 2.95 0.44 - 1.00 mg/dL   Glucose, Bld 96 70 - 99 mg/dL    Comment: Glucose reference range applies only to samples taken after fasting for at least 8 hours.   Calcium, Ion 1.17 1.15 - 1.40 mmol/L   TCO2 31 22 - 32 mmol/L   Hemoglobin 13.3 12.0 - 15.0 g/dL   HCT 62.1 30.8 - 65.7 %  Glucose, capillary     Status: Abnormal   Collection Time: 05/27/23  6:29 PM  Result Value Ref Range   Glucose-Capillary 114 (H) 70 - 99 mg/dL    Comment: Glucose reference range applies only to samples taken after fasting for at least 8 hours.  Glucose, capillary     Status: Abnormal   Collection Time: 05/27/23  7:05 PM  Result Value Ref Range   Glucose-Capillary 108 (H) 70 - 99 mg/dL    Comment: Glucose reference range applies only to samples taken after fasting for at least 8 hours.  Glucose, capillary     Status: None   Collection Time: 05/27/23  8:02 PM  Result Value Ref Range   Glucose-Capillary 84 70 - 99 mg/dL    Comment: Glucose reference range applies only to samples taken after fasting for at least 8 hours.  MRSA Next Gen by PCR, Nasal     Status: None   Collection Time: 05/27/23  8:06 PM   Specimen: Nasal Mucosa; Nasal Swab  Result Value Ref  Range   MRSA by PCR Next Gen NOT DETECTED NOT DETECTED    Comment: (NOTE) The GeneXpert MRSA Assay (FDA approved for NASAL specimens only), is one component of a comprehensive MRSA colonization surveillance program. It is not intended to diagnose MRSA infection nor to guide or monitor treatment for MRSA infections. Test performance is not FDA approved in patients less than 19 years old. Performed at [facility], [address], [place], [place] [address]   Basic metabolic panel     Status: Abnormal   Collection Time: 05/27/23  8:18 PM  Result Value Ref Range   Sodium 138 135 - 145 mmol/L   Potassium 3.2 (L) 3.5 - 5.1 mmol/L   Chloride 100 98 - 111 mmol/L   CO2 28 22 - 32 mmol/L   Glucose, Bld 85 70 - 99 mg/dL    Comment: Glucose reference range applies only to samples taken after fasting for at least 8 hours.   BUN 6 6 - 20 mg/dL   Creatinine, Ser 2.95 0.44 - 1.00 mg/dL   Calcium 8.6 (L) 8.9 - 10.3 mg/dL   GFR, Estimated >28 >41 mL/min  Comment: (NOTE) Calculated using the CKD-EPI Creatinine Equation (2021)    Anion gap 10 5 - 15    Comment: Performed at [facility], [address], [place], [place] [address]  CBC     Status: Abnormal   Collection Time: 05/27/23  8:18 PM  Result Value Ref Range   WBC 14.3 (H) 4.0 - 10.5 K/uL   RBC 4.32 3.87 - 5.11 MIL/uL   Hemoglobin 12.4 12.0 - 15.0 g/dL   HCT 41.6 60.6 - 30.1 %   MCV 84.3 80.0 - 100.0 fL   MCH 28.7 26.0 - 34.0 pg   MCHC 34.1 30.0 - 36.0 g/dL   RDW 60.1 09.3 - 23.5 %   Platelets 335 150 - 400 K/uL   nRBC 0.0 0.0 - 0.2 %    Comment: Performed at [facility], [address], [place], [place] [address]  HIV Antibody (routine testing w rflx)     Status: None   Collection Time: 05/27/23  8:18 PM  Result Value Ref Range   HIV Screen 4th Generation wRfx Non Reactive Non Reactive    Comment: Performed at [facility], [address] [place], [place] [address]  Glucose, capillary     Status:  Abnormal   Collection Time: 05/27/23 11:28 PM  Result Value Ref Range   Glucose-Capillary 69 (L) 70 - 99 mg/dL    Comment: Glucose reference range applies only to samples taken after fasting for at least 8 hours.  CBC     Status: Abnormal   Collection Time: 05/28/23 12:10 AM  Result Value Ref Range   WBC 15.1 (H) 4.0 - 10.5 K/uL   RBC 4.20 3.87 - 5.11 MIL/uL   Hemoglobin 12.3 12.0 - 15.0 g/dL   HCT 70.6 (L) 23.7 - 62.8 %   MCV 84.5 80.0 - 100.0 fL   MCH 29.3 26.0 - 34.0 pg   MCHC 34.6 30.0 - 36.0 g/dL   RDW 31.5 17.6 - 16.0 %   Platelets 323 150 - 400 K/uL   nRBC 0.0 0.0 - 0.2 %    Comment: Performed at [facility], [address], [place], [place] [address]  Basic metabolic panel     Status: Abnormal   Collection Time: 05/28/23 12:10 AM  Result Value Ref Range   Sodium 138 135 - 145 mmol/L   Potassium 3.6 3.5 - 5.1 mmol/L   Chloride 100 98 - 111 mmol/L   CO2 28 22 - 32 mmol/L   Glucose, Bld 141 (H) 70 - 99 mg/dL    Comment: Glucose reference range applies only to samples taken after fasting for at least 8 hours.   BUN 7 6 - 20 mg/dL   Creatinine, Ser 6.26 0.44 - 1.00 mg/dL   Calcium 8.2 (L) 8.9 - 10.3 mg/dL   GFR, Estimated >94 >85 mL/min    Comment: (NOTE) Calculated using the CKD-EPI Creatinine Equation (2021)    Anion gap 10 5 - 15    Comment: Performed at [facility], [address], [place], [place] [address]  Magnesium     Status: None   Collection Time: 05/28/23 12:10 AM  Result Value Ref Range   Magnesium 1.7 1.7 - 2.4 mg/dL    Comment: Performed at [facility], [address] [place], [place] [address]  Phosphorus     Status: Abnormal   Collection Time: 05/28/23 12:10 AM  Result Value Ref Range   Phosphorus 5.2 (  H) 2.5 - 4.6 mg/dL    Comment: Performed at [facility], [address] [place], [place] [address]  Glucose, capillary     Status: None   Collection Time: 05/28/23  1:08 AM  Result Value Ref Range   Glucose-Capillary 97  70 - 99 mg/dL    Comment: Glucose reference range applies only to samples taken after fasting for at least 8 hours.  Glucose, capillary     Status: Abnormal   Collection Time: 05/28/23  3:09 AM  Result Value Ref Range   Glucose-Capillary 113 (H) 70 - 99 mg/dL    Comment: Glucose reference range applies only to samples taken after fasting for at least 8 hours.  Glucose, capillary     Status: Abnormal   Collection Time: 05/28/23  5:06 AM  Result Value Ref Range   Glucose-Capillary 115 (H) 70 - 99 mg/dL    Comment: Glucose reference range applies only to samples taken after fasting for at least 8 hours.  Glucose, capillary     Status: Abnormal   Collection Time: 05/28/23  7:07 AM  Result Value Ref Range   Glucose-Capillary 138 (H) 70 - 99 mg/dL    Comment: Glucose reference range applies only to samples taken after fasting for at least 8 hours.  Glucose, capillary     Status: Abnormal   Collection Time: 05/28/23  9:00 AM  Result Value Ref Range   Glucose-Capillary 170 (H) 70 - 99 mg/dL    Comment: Glucose reference range applies only to samples taken after fasting for at least 8 hours.    ASSESSMENT/PLAN: POD#1 s/p Ex-lap, hernia reduction, Vicryl mesh underlay and fascial closure on 7/27.  -NPO, NG tube in place -GI management per gen surgery -pain management with PCA -DM/fluid management: per critical care team -pharmacy consult for TPN  Will continue to follow appreciate recs from general surgery and critical care team  DVT Prophylaxis:  Lovenox Code Status: Full  Myna Hidalgo, DO Attending Obstetrician & Gynecologist, Faculty Practice Center for [employer], [facility]

## 2023-05-28 NOTE — Progress Notes (Signed)
HR staying 130-140s. Bp is 128/90. She has been pushing the button for her PCA pump as needed. EKG done. Notified Elink nurse of HR staying elevated.

## 2023-05-28 NOTE — Progress Notes (Signed)
Initial Nutrition Assessment  DOCUMENTATION CODES:   Obesity unspecified  INTERVENTION:  - TPN per pharmacy management starting on 7/29.   NUTRITION DIAGNOSIS:   Inadequate oral intake related to inability to eat as evidenced by NPO status.  GOAL:   Patient will meet greater than or equal to 90% of their needs  MONITOR:   I & O's, Diet advancement  REASON FOR ASSESSMENT:   Consult New TPN/TNA  ASSESSMENT:   42 y.o. female admits related to hypoglycemia. PMH includes: DM, hemorrhagic ovarian cyst, intraabdominal adhesions. Pt with more recent admission from 05/19/23-05/25/23 and is currently POD8 from ex-lap evac of L hemorrhagic ovarian cyst, and extensive intraabdominal LOA, sigmoid colon serosal tear repair. Pt discharged on 7/25 and prior to discharge she had a +BM and +flatus. On 7/26, pt began to have abdominal pain and distention. Pt then began to experience ongoing vomiting starting on 7/27. Pt is now receiving medical management related to hernia with bowel obstruction, fascial dehiscence -- s/p ex lap hernia reduction, fascia closure (7/27).  Meds reviewed: dextrose oral gel 40%; sliding scale insulin. Labs reviewed: phos high.  IVF: D5 LR @ 50 mL/hr. I/Os reviewed: +1.9 L since admit.   MD consult for new TPN. RD unable to get pt via phone. Pt is currently NPO with NG tube to suction. Pt had PICC line placed today. Per H&P note, pt not eating well for about 1 week PTA. Pt was not seen by RD at previous admission. Pt is at risk for refeeding syndrome. RD recommends slow advancement of dextrose/caloric content in TPN. Recommend addition of thiamine in TPN bag as well. RD will continue to monitor POC and diet advancement.   NUTRITION - FOCUSED PHYSICAL EXAM:  Remote assessment.  Diet Order:   Diet Order             Diet NPO time specified  Diet effective now                   EDUCATION NEEDS:   Not appropriate for education at this time  Skin:  Skin  Assessment: Skin Integrity Issues: Skin Integrity Issues:: Incisions Incisions: abdomen  Last BM:  PTA  Height:   Ht Readings from Last 1 Encounters:  05/27/23 4' 9.01" (1.448 m)    Weight:   Wt Readings from Last 1 Encounters:  05/28/23 83.6 kg    Ideal Body Weight:     BMI:  Body mass index is 39.87 kg/m.  Estimated Nutritional Needs:   Kcal:  2300-2500 kcals  Protein:  145-160 gm  Fluid:  >/= 2.3 L  Maria [name], RD, LDN, CNSC.

## 2023-05-28 NOTE — Progress Notes (Signed)
eLink Physician-Brief Progress Note Patient Name: Maria [name] DOB: [birth year] MRN: [id number]   Date of Service  05/28/2023  HPI/Events of Note  42 year old female admitted to ICU for hypoglycemia related to sulfonylurea use. She is postop incisional hernia repair.  Complicated by bowel obstruction.  Patient has been oliguric with concentrated urine, will sinus tachycardic.  Normotensive.  Remains NPO.  eICU Interventions  LR bolus.  Maintain D5 LR continuous infusion.     Intervention Category Minor Interventions: Clinical assessment - ordering diagnostic tests  [name] 05/28/2023, 10:54 PM

## 2023-05-29 ENCOUNTER — Inpatient Hospital Stay (HOSPITAL_COMMUNITY): Payer: 59

## 2023-05-29 DIAGNOSIS — K567 Ileus, unspecified: Secondary | ICD-10-CM | POA: Diagnosis not present

## 2023-05-29 DIAGNOSIS — E440 Moderate protein-calorie malnutrition: Secondary | ICD-10-CM | POA: Insufficient documentation

## 2023-05-29 LAB — GLUCOSE, CAPILLARY
Glucose-Capillary: 154 mg/dL — ABNORMAL HIGH (ref 70–99)
Glucose-Capillary: 150 mg/dL — ABNORMAL HIGH (ref 70–99)
Glucose-Capillary: 169 mg/dL — ABNORMAL HIGH (ref 70–99)
Glucose-Capillary: 145 mg/dL — ABNORMAL HIGH (ref 70–99)
Glucose-Capillary: 163 mg/dL — ABNORMAL HIGH (ref 70–99)
Glucose-Capillary: 156 mg/dL — ABNORMAL HIGH (ref 70–99)
Glucose-Capillary: 180 mg/dL — ABNORMAL HIGH (ref 70–99)
Glucose-Capillary: 218 mg/dL — ABNORMAL HIGH (ref 70–99)

## 2023-05-29 LAB — URINALYSIS, ROUTINE W REFLEX MICROSCOPIC
Bilirubin Urine: NEGATIVE
Glucose, UA: 250 mg/dL — AB
Ketones, ur: NEGATIVE mg/dL
Leukocytes,Ua: NEGATIVE
Nitrite: NEGATIVE
Protein, ur: 30 mg/dL — AB
Specific Gravity, Urine: >1.03 — ABNORMAL HIGH (ref 1.005–1.030)
pH: 5.5 (ref 5.0–8.0)

## 2023-05-29 LAB — URINALYSIS, MICROSCOPIC (REFLEX)

## 2023-05-29 MED ORDER — ACETAMINOPHEN 10 MG/ML IV SOLN
1000.0000 mg | Freq: Four times a day (QID) | INTRAVENOUS | Status: AC
  Administered 2023-05-29 – 2023-05-30 (×4): 1000 mg via INTRAVENOUS
  Filled 2023-05-29 (×4): qty 100

## 2023-05-29 MED ORDER — POTASSIUM CHLORIDE 10 MEQ/50ML IV SOLN
10.0000 meq | INTRAVENOUS | Status: AC
  Administered 2023-05-29 (×2): 10 meq via INTRAVENOUS
  Filled 2023-05-29 (×2): qty 50

## 2023-05-29 MED ORDER — LACTATED RINGERS IV BOLUS
1000.0000 mL | Freq: Once | INTRAVENOUS | Status: AC
  Administered 2023-05-29: 1000 mL via INTRAVENOUS

## 2023-05-29 MED ORDER — TRAVASOL 10 % IV SOLN: INTRAVENOUS | Status: DC

## 2023-05-29 MED ORDER — TRAVASOL 10 % IV SOLN
INTRAVENOUS | Status: AC
  Filled 2023-05-29: qty 720

## 2023-05-29 MED ORDER — INSULIN ASPART 100 UNIT/ML IJ SOLN
0.0000 [IU] | INTRAMUSCULAR | Status: DC
  Administered 2023-05-29: 2 [IU] via SUBCUTANEOUS
  Administered 2023-05-29: 1 [IU] via SUBCUTANEOUS
  Administered 2023-05-29: 2 [IU] via SUBCUTANEOUS
  Administered 2023-05-29: 3 [IU] via SUBCUTANEOUS
  Administered 2023-05-30: 2 [IU] via SUBCUTANEOUS
  Administered 2023-05-30: 3 [IU] via SUBCUTANEOUS

## 2023-05-29 NOTE — Progress Notes (Signed)
Nutrition Follow-up  DOCUMENTATION CODES:   Non-severe (moderate) malnutrition in context of acute illness/injury  INTERVENTION:   - TPN management per Pharmacy  NUTRITION DIAGNOSIS:   Moderate Malnutrition related to acute illness (bowel obstruction, nausea, vomiting) as evidenced by mild muscle depletion, energy intake < or equal to 50% for > or equal to 5 days.  New diagnosis after completion of NFPE  GOAL:   Patient will meet greater than or equal to 90% of their needs  Progressing with initiation of TPN  MONITOR:   Diet advancement, Labs, Weight trends, Skin, I & O's, Other (TPN)  REASON FOR ASSESSMENT:   Consult New TPN/TNA  ASSESSMENT:   42 y.o. female admits related to hypoglycemia. PMH includes: DM, hemorrhagic ovarian cyst, intraabdominal adhesions. Pt with more recent admission from 05/19/23-05/25/23 and is currently POD8 from ex-lap evac of L hemorrhagic ovarian cyst, and extensive intraabdominal LOA, sigmoid colon serosal tear repair. Pt discharged on 7/25 and prior to discharge she had a +BM and +flatus. On 7/26, pt began to have abdominal pain and distention. Pt then began to experience ongoing vomiting starting on 7/27. Pt is now receiving medical management related to hernia with bowel obstruction, fascial dehiscence -- s/p ex lap hernia reduction, fascia closure (7/27).  7/19 - presented to ED with abdominal pain 7/20 - s/p diagnostic laparoscopy converted to ex-lap with extensive LOA, sigmoid colon serosa repair, evacuation of hemorrhagic ovarian cyst 7/22 - episode of vomiting 7/23 - episode of vomiting x 2 7/24 - episode of vomiting x 2 7/25 - discharged, eating lightly 7/27 - presented to ED with distended abdomen and N/V, NG tube placed, s/p reopening of recent laparotomy, reduction of incisional hernia, fascial closure and placement of a Vicryl mesh underlay 07/28 - Pharmacy consulted for TPN, decision was made after discussion with MD to hold TPN for  1-2 days and monitor for return of bowel function, PICC placed 07/29 - TPN start  Discussed pt with RN and during ICU rounds. NG tube remains in place to LIWS with bilious output.  Noted consult to Pharmacy yesterday for TPN; however, decision was made after discussion with MD to hold TPN for 1-2 days and monitor for return of bowel function. PICC placed yesterday.  Spoke with pt at bedside. Pt reports good appetite and PO intake prior to initial admission on 7/19 for surgery on 7/20. Pt reports minimal PO intake since original surgery on 7/20 due to nausea and vomiting. Pt reports that she tried to eat at home after being discharged on 7/25 but was unable to keep anything down. Noted pt had multiple episodes of vomiting during previous admission as well. Pt has been without adequate nutrition now since 7/20 (10 days). Pt meets criteria for acute malnutrition. Recommend starting TPN today.  Discussed pt with TPN Pharmacist. Plan to start TPN today at 50 ml/hr which will meet ~50% of estimated needs. Goal rate of TPN is 105 ml/hr which will provide 2371 kcal and 151 grams of protein. RD to adjust estimated nutrition needs slightly.  Medications reviewed and include: SSI every 6 hours, IV pepcid, IV KCl 10 mEq x 2 IVF: D5 in LR @ 75 ml/hr  Labs reviewed: BUN 24, creatinine 1.49, WBC 17.5 CBG's: 142-201 x 24 hours  NUTRITION - FOCUSED PHYSICAL EXAM:  Flowsheet Row Most Recent Value  Orbital Region No depletion  Upper Arm Region No depletion  Thoracic and Lumbar Region No depletion  Buccal Region No depletion  Temple Region No depletion  Clavicle Bone Region Mild depletion  Clavicle and Acromion Bone Region Mild depletion  Scapular Bone Region No depletion  Dorsal Hand No depletion  Patellar Region No depletion  Anterior Thigh Region Mild depletion  Posterior Calf Region Mild depletion  Edema (RD Assessment) Mild  Hair Reviewed  Eyes Reviewed  Mouth Reviewed  Skin Reviewed  Nails  Reviewed    Diet Order:   Diet Order             Diet NPO time specified  Diet effective now                   EDUCATION NEEDS:   Education needs have been addressed  Skin:  Skin Assessment: Skin Integrity Issues: Incisions: abdomen  Last BM:  05/19/23  Height:   Ht Readings from Last 1 Encounters:  05/27/23 4' 9.01" (1.448 m)    Weight:   Wt Readings from Last 1 Encounters:  05/29/23 86.8 kg    BMI:  Body mass index is 41.4 kg/m.  Estimated Nutritional Needs:   Kcal:  2200-2400  Protein:  125-145 grams  Fluid:  >2.2 L    Mertie Clause, MS, RD, LDN Inpatient Clinical Dietitian Please see AMiON for contact information.

## 2023-05-29 NOTE — Progress Notes (Signed)
Patient ID: Maria [name], female   DOB: [birth year], 42 y.o.   MRN: [id number]           [facility] OB/GYN Attending Progress Note  ADMISSION DIAGNOSIS:   Principal Problem:   Ileus (HCC) Active Problems:   Hypoglycemia   Intestinal obstruction (HCC)   Malnutrition of moderate degree   Subjective   Patient reports feeling well this morning with persistent nausea. She reports last emesis early yesterday evening. Her pain is well controlled.  Denies passing flatus.  Denies fever/chills.   Objective  VITALS:  height is 4' 9.01" (1.448 m) and weight is 86.8 kg. Her oral temperature is 99 F (37.2 C). Her blood pressure is 113/70 and her pulse is 109 (abnormal). Her respiration is 23 (abnormal) and oxygen saturation is 98%.   EXAMINATION: CONSTITUTIONAL: no acute distress SKIN: warm and dry, non-diaphoretic NEUROLOGIC: Alert and oriented, no focal deficits PSYCHIATRIC: Normal mood and affect. Normal behavior. CARDIOVASCULAR: Regular rate and rhythm RESPIRATORY: Effort and breath sounds normal, no problems with respiration noted Abdomen: soft, mild distension.  Some BS noted. Appropriately tender.  Incision with honeycomb dressing- scant drainage at lower portion- overall clean and dry.  JP drain in place with serosanguinous fluid Ext: SCDs in place, no calf tenderness   Laboratory Reports: Results for orders placed or performed during the hospital encounter of 05/27/23 (from the past 72 hour(s))  Glucose, capillary     Status: Abnormal   Collection Time: 05/27/23 12:28 PM  Result Value Ref Range   Glucose-Capillary 18 (LL) 70 - 99 mg/dL    Comment: Glucose reference range applies only to samples taken after fasting for at least 8 hours.  CBC     Status: Abnormal   Collection Time: 05/27/23 12:43 PM  Result Value Ref Range   WBC 15.4 (H) 4.0 - 10.5 K/uL   RBC 5.19 (H) 3.87 - 5.11 MIL/uL   Hemoglobin 14.9 12.0 - 15.0 g/dL   HCT 47.8 29.5 - 62.1 %   MCV  83.0 80.0 - 100.0 fL   MCH 28.7 26.0 - 34.0 pg   MCHC 34.6 30.0 - 36.0 g/dL   RDW 30.8 65.7 - 84.6 %   Platelets 426 (H) 150 - 400 K/uL   nRBC 0.0 0.0 - 0.2 %    Comment: Performed at [facility], [address] [place], [place] [address]  Comprehensive metabolic panel     Status: Abnormal   Collection Time: 05/27/23 12:43 PM  Result Value Ref Range   Sodium 139 135 - 145 mmol/L   Potassium 2.9 (L) 3.5 - 5.1 mmol/L   Chloride 96 (L) 98 - 111 mmol/L   CO2 31 22 - 32 mmol/L   Glucose, Bld 30 (LL) 70 - 99 mg/dL    Comment: CRITICAL RESULT CALLED TO, READ BACK BY AND VERIFIED WITH K MONTGOMERY RN AT [id number] [id number] BY D LONG Glucose reference range applies only to samples taken after fasting for at least 8 hours.    BUN 5 (L) 6 - 20 mg/dL   Creatinine, Ser 4.40 0.44 - 1.00 mg/dL   Calcium 9.6 8.9 - 10.2 mg/dL   Total Protein 8.0 6.5 - 8.1 g/dL   Albumin 4.0 3.5 - 5.0 g/dL   AST 36 15 - 41 U/L   ALT 46 (H) 0 - 44 U/L   Alkaline Phosphatase 69 38 - 126 U/L   Total Bilirubin 0.6 0.3 - 1.2 mg/dL   GFR, Estimated >72 >  60 mL/min    Comment: (NOTE) Calculated using the CKD-EPI Creatinine Equation (2021)    Anion gap 12 5 - 15    Comment: Performed at [facility], [address] [place], [place] [address]  Lipase, blood     Status: None   Collection Time: 05/27/23 12:43 PM  Result Value Ref Range   Lipase 38 11 - 51 U/L    Comment: Performed at [facility], [address], [place], [place] [address]  Magnesium     Status: None   Collection Time: 05/27/23 12:43 PM  Result Value Ref Range   Magnesium 2.2 1.7 - 2.4 mg/dL    Comment: Performed at [facility], [address] [place], [place] [address]  Glucose, capillary     Status: Abnormal   Collection Time: 05/27/23 12:49 PM  Result Value Ref Range   Glucose-Capillary 31 (LL) 70 - 99 mg/dL    Comment: Glucose reference range applies only to samples taken after fasting for at least 8 hours.  Glucose,  capillary     Status: Abnormal   Collection Time: 05/27/23  1:14 PM  Result Value Ref Range   Glucose-Capillary 42 (LL) 70 - 99 mg/dL    Comment: Glucose reference range applies only to samples taken after fasting for at least 8 hours.   Comment 1 Notify RN   Blood gas, arterial     Status: Abnormal   Collection Time: 05/27/23  1:20 PM  Result Value Ref Range   FIO2 21.00 %   Delivery systems ROOM AIR    pH, Arterial 7.53 (H) 7.35 - 7.45   pCO2 arterial 41 32 - 48 mmHg   pO2, Arterial 79 (L) 83 - 108 mmHg   Bicarbonate 34.3 (H) 20.0 - 28.0 mmol/L   Acid-Base Excess 10.5 (H) 0.0 - 2.0 mmol/L   O2 Saturation 97.9 %   Patient temperature 37.0    Collection site RIGHT RADIAL    Drawn by [id number]    Allens test (pass/fail) PASS PASS    Comment: Performed at [facility], [address], [place], [place] [address]  Glucose, capillary     Status: Abnormal   Collection Time: 05/27/23  1:39 PM  Result Value Ref Range   Glucose-Capillary 245 (H) 70 - 99 mg/dL    Comment: Glucose reference range applies only to samples taken after fasting for at least 8 hours.  Glucose, capillary     Status: Abnormal   Collection Time: 05/27/23  2:10 PM  Result Value Ref Range   Glucose-Capillary 149 (H) 70 - 99 mg/dL    Comment: Glucose reference range applies only to samples taken after fasting for at least 8 hours.  Glucose, capillary     Status: None   Collection Time: 05/27/23  3:05 PM  Result Value Ref Range   Glucose-Capillary 99 70 - 99 mg/dL    Comment: Glucose reference range applies only to samples taken after fasting for at least 8 hours.  Glucose, capillary     Status: Abnormal   Collection Time: 05/27/23  4:36 PM  Result Value Ref Range   Glucose-Capillary 27 (LL) 70 - 99 mg/dL    Comment: Glucose reference range applies only to samples taken after fasting for at least 8 hours.  Glucose, capillary     Status: Abnormal   Collection Time: 05/27/23  5:01 PM  Result Value Ref Range    Glucose-Capillary 178 (H) 70 - 99 mg/dL  Comment: Glucose reference range applies only to samples taken after fasting for at least 8 hours.  Glucose, capillary     Status: Abnormal   Collection Time: 05/27/23  5:30 PM  Result Value Ref Range   Glucose-Capillary 142 (H) 70 - 99 mg/dL    Comment: Glucose reference range applies only to samples taken after fasting for at least 8 hours.  Glucose, capillary     Status: None   Collection Time: 05/27/23  5:56 PM  Result Value Ref Range   Glucose-Capillary 93 70 - 99 mg/dL    Comment: Glucose reference range applies only to samples taken after fasting for at least 8 hours.  I-STAT, chem 8     Status: None   Collection Time: 05/27/23  5:58 PM  Result Value Ref Range   Sodium 140 135 - 145 mmol/L   Potassium 3.5 3.5 - 5.1 mmol/L   Chloride 98 98 - 111 mmol/L   BUN 6 6 - 20 mg/dL   Creatinine, Ser 1.61 0.44 - 1.00 mg/dL   Glucose, Bld 96 70 - 99 mg/dL    Comment: Glucose reference range applies only to samples taken after fasting for at least 8 hours.   Calcium, Ion 1.17 1.15 - 1.40 mmol/L   TCO2 31 22 - 32 mmol/L   Hemoglobin 13.3 12.0 - 15.0 g/dL   HCT 09.6 04.5 - 40.9 %  Glucose, capillary     Status: Abnormal   Collection Time: 05/27/23  6:29 PM  Result Value Ref Range   Glucose-Capillary 114 (H) 70 - 99 mg/dL    Comment: Glucose reference range applies only to samples taken after fasting for at least 8 hours.  Glucose, capillary     Status: Abnormal   Collection Time: 05/27/23  7:05 PM  Result Value Ref Range   Glucose-Capillary 108 (H) 70 - 99 mg/dL    Comment: Glucose reference range applies only to samples taken after fasting for at least 8 hours.  Glucose, capillary     Status: None   Collection Time: 05/27/23  8:02 PM  Result Value Ref Range   Glucose-Capillary 84 70 - 99 mg/dL    Comment: Glucose reference range applies only to samples taken after fasting for at least 8 hours.  MRSA Next Gen by PCR, Nasal     Status: None    Collection Time: 05/27/23  8:06 PM   Specimen: Nasal Mucosa; Nasal Swab  Result Value Ref Range   MRSA by PCR Next Gen NOT DETECTED NOT DETECTED    Comment: (NOTE) The GeneXpert MRSA Assay (FDA approved for NASAL specimens only), is one component of a comprehensive MRSA colonization surveillance program. It is not intended to diagnose MRSA infection nor to guide or monitor treatment for MRSA infections. Test performance is not FDA approved in patients less than 6 years old. Performed at [facility], [address] [place], [place] [address]   Basic metabolic panel     Status: Abnormal   Collection Time: 05/27/23  8:18 PM  Result Value Ref Range   Sodium 138 135 - 145 mmol/L   Potassium 3.2 (L) 3.5 - 5.1 mmol/L   Chloride 100 98 - 111 mmol/L   CO2 28 22 - 32 mmol/L   Glucose, Bld 85 70 - 99 mg/dL    Comment: Glucose reference range applies only to samples taken after fasting for at least 8 hours.   BUN 6 6 - 20 mg/dL   Creatinine, Ser 4.78  0.44 - 1.00 mg/dL   Calcium 8.6 (L) 8.9 - 10.3 mg/dL   GFR, Estimated >09 >81 mL/min    Comment: (NOTE) Calculated using the CKD-EPI Creatinine Equation (2021)    Anion gap 10 5 - 15    Comment: Performed at [facility], [address], [place], [place] [address]  CBC     Status: Abnormal   Collection Time: 05/27/23  8:18 PM  Result Value Ref Range   WBC 14.3 (H) 4.0 - 10.5 K/uL   RBC 4.32 3.87 - 5.11 MIL/uL   Hemoglobin 12.4 12.0 - 15.0 g/dL   HCT 82.9 56.2 - 13.0 %   MCV 84.3 80.0 - 100.0 fL   MCH 28.7 26.0 - 34.0 pg   MCHC 34.1 30.0 - 36.0 g/dL   RDW 86.5 78.4 - 69.6 %   Platelets 335 150 - 400 K/uL   nRBC 0.0 0.0 - 0.2 %    Comment: Performed at [facility], [address] [place], [place] [address]  HIV Antibody (routine testing w rflx)     Status: None   Collection Time: 05/27/23  8:18 PM  Result Value Ref Range   HIV Screen 4th Generation wRfx Non Reactive Non Reactive    Comment: Performed at [facility], [address], [place], [place] [address]  Glucose, capillary     Status: Abnormal   Collection Time: 05/27/23 11:28 PM  Result Value Ref Range   Glucose-Capillary 69 (L) 70 - 99 mg/dL    Comment: Glucose reference range applies only to samples taken after fasting for at least 8 hours.  CBC     Status: Abnormal   Collection Time: 05/28/23 12:10 AM  Result Value Ref Range   WBC 15.1 (H) 4.0 - 10.5 K/uL   RBC 4.20 3.87 - 5.11 MIL/uL   Hemoglobin 12.3 12.0 - 15.0 g/dL   HCT 40.1 (L) 02.7 - 25.3 %   MCV 84.5 80.0 - 100.0 fL   MCH 29.3 26.0 - 34.0 pg   MCHC 34.6 30.0 - 36.0 g/dL   RDW 66.4 40.3 - 47.4 %   Platelets 323 150 - 400 K/uL   nRBC 0.0 0.0 - 0.2 %    Comment: Performed at [facility], [address], [place], [place] [address]  Basic metabolic panel     Status: Abnormal   Collection Time: 05/28/23 12:10 AM  Result Value Ref Range   Sodium 138 135 - 145 mmol/L   Potassium 3.6 3.5 - 5.1 mmol/L   Chloride 100 98 - 111 mmol/L   CO2 28 22 - 32 mmol/L   Glucose, Bld 141 (H) 70 - 99 mg/dL    Comment: Glucose reference range applies only to samples taken after fasting for at least 8 hours.   BUN 7 6 - 20 mg/dL   Creatinine, Ser 3.87 0.44 - 1.00 mg/dL   Calcium 8.2 (L) 8.9 - 10.3 mg/dL   GFR, Estimated >56 >43 mL/min    Comment: (NOTE) Calculated using the CKD-EPI Creatinine Equation (2021)    Anion gap 10 5 - 15    Comment: Performed at [facility], [address], [place], [place] [address]  Magnesium     Status: None   Collection Time: 05/28/23 12:10 AM  Result Value Ref Range   Magnesium 1.7 1.7 - 2.4 mg/dL    Comment: Performed at [facility], [address] [place], [place] [address]  Phosphorus     Status: Abnormal   Collection Time: 05/28/23 12:10 AM  Result Value Ref Range   Phosphorus 5.2 (H) 2.5 - 4.6 mg/dL    Comment: Performed at [facility], [address], [place], [place] [address]  Glucose, capillary     Status:  None   Collection Time: 05/28/23  1:08 AM  Result Value Ref Range   Glucose-Capillary 97 70 - 99 mg/dL    Comment: Glucose reference range applies only to samples taken after fasting for at least 8 hours.  Glucose, capillary     Status: Abnormal   Collection Time: 05/28/23  3:09 AM  Result Value Ref Range   Glucose-Capillary 113 (H) 70 - 99 mg/dL    Comment: Glucose reference range applies only to samples taken after fasting for at least 8 hours.  Glucose, capillary     Status: Abnormal   Collection Time: 05/28/23  5:06 AM  Result Value Ref Range   Glucose-Capillary 115 (H) 70 - 99 mg/dL    Comment: Glucose reference range applies only to samples taken after fasting for at least 8 hours.  Glucose, capillary     Status: Abnormal   Collection Time: 05/28/23  7:07 AM  Result Value Ref Range   Glucose-Capillary 138 (H) 70 - 99 mg/dL    Comment: Glucose reference range applies only to samples taken after fasting for at least 8 hours.  Glucose, capillary     Status: Abnormal   Collection Time: 05/28/23  9:00 AM  Result Value Ref Range   Glucose-Capillary 170 (H) 70 - 99 mg/dL    Comment: Glucose reference range applies only to samples taken after fasting for at least 8 hours.  Glucose, capillary     Status: Abnormal   Collection Time: 05/28/23 11:03 AM  Result Value Ref Range   Glucose-Capillary 198 (H) 70 - 99 mg/dL    Comment: Glucose reference range applies only to samples taken after fasting for at least 8 hours.  Glucose, capillary     Status: Abnormal   Collection Time: 05/28/23  4:46 PM  Result Value Ref Range   Glucose-Capillary 201 (H) 70 - 99 mg/dL    Comment: Glucose reference range applies only to samples taken after fasting for at least 8 hours.  Glucose, capillary     Status: Abnormal   Collection Time: 05/28/23  7:16 PM  Result Value Ref Range   Glucose-Capillary 142 (H) 70 - 99 mg/dL    Comment: Glucose reference range applies only to samples taken after fasting for  at least 8 hours.  Glucose, capillary     Status: Abnormal   Collection Time: 05/28/23 11:23 PM  Result Value Ref Range   Glucose-Capillary 153 (H) 70 - 99 mg/dL    Comment: Glucose reference range applies only to samples taken after fasting for at least 8 hours.  Glucose, capillary     Status: Abnormal   Collection Time: 05/29/23  3:24 AM  Result Value Ref Range   Glucose-Capillary 154 (H) 70 - 99 mg/dL    Comment: Glucose reference range applies only to samples taken after fasting for at least 8 hours.  Basic metabolic panel     Status: Abnormal   Collection Time: 05/29/23  5:32 AM  Result Value Ref Range   Sodium 137 135 - 145 mmol/L   Potassium 3.6 3.5 - 5.1 mmol/L   Chloride 103 98 - 111 mmol/L   CO2 27 22 - 32 mmol/L  Glucose, Bld 172 (H) 70 - 99 mg/dL    Comment: Glucose reference range applies only to samples taken after fasting for at least 8 hours.   BUN 24 (H) 6 - 20 mg/dL   Creatinine, Ser 1.61 (H) 0.44 - 1.00 mg/dL   Calcium 7.7 (L) 8.9 - 10.3 mg/dL   GFR, Estimated 45 (L) >60 mL/min    Comment: (NOTE) Calculated using the CKD-EPI Creatinine Equation (2021)    Anion gap 7 5 - 15    Comment: Performed at [facility], [address], [place], [place] [address]  CBC     Status: Abnormal   Collection Time: 05/29/23  5:32 AM  Result Value Ref Range   WBC 17.5 (H) 4.0 - 10.5 K/uL   RBC 3.56 (L) 3.87 - 5.11 MIL/uL   Hemoglobin 10.3 (L) 12.0 - 15.0 g/dL   HCT 54.0 (L) 98.1 - 19.1 %   MCV 86.5 80.0 - 100.0 fL   MCH 28.9 26.0 - 34.0 pg   MCHC 33.4 30.0 - 36.0 g/dL   RDW 47.8 29.5 - 62.1 %   Platelets 317 150 - 400 K/uL   nRBC 0.0 0.0 - 0.2 %    Comment: Performed at [facility], [address] [place], [place] [address]  Magnesium     Status: None   Collection Time: 05/29/23  5:32 AM  Result Value Ref Range   Magnesium 2.4 1.7 - 2.4 mg/dL    Comment: Performed at [facility], [address], [place], [place] [address]  Phosphorus     Status:  None   Collection Time: 05/29/23  5:32 AM  Result Value Ref Range   Phosphorus 3.6 2.5 - 4.6 mg/dL    Comment: Performed at [facility], [address], [place], [place] [address]  Glucose, capillary     Status: Abnormal   Collection Time: 05/29/23  5:36 AM  Result Value Ref Range   Glucose-Capillary 150 (H) 70 - 99 mg/dL    Comment: Glucose reference range applies only to samples taken after fasting for at least 8 hours.  Glucose, capillary     Status: Abnormal   Collection Time: 05/29/23  7:19 AM  Result Value Ref Range   Glucose-Capillary 169 (H) 70 - 99 mg/dL    Comment: Glucose reference range applies only to samples taken after fasting for at least 8 hours.   Date 05/29/23 0701 - 05/30/23 0700  Shift 0701-1900 1901-0700 24 Hour Total  INTAKE  I.V.(mL/kg) 50(0.6)  50(0.6)    Volume (mL) (dextrose 5 % in lactated ringers infusion) 50  50  Shift Total(mL/kg) 50(0.6)  50(0.6)  OUTPUT  Urine(mL/kg/hr) 55  55    Output (mL) (Urethral Catheter Annie Sable RN Latex;Straight-tip 14 Fr.) 55  55  Drains 25  25    Output (mL) (Closed System Drain 1 LUQ Bulb (JP) 19 Fr.) 25  25  Shift Total(mL/kg) 80(0.9)  80(0.9)  NET -30  -30  Weight (kg) 86.8 86.8 86.8     ASSESSMENT/PLAN: POD#2 s/p Ex-lap, hernia reduction, Vicryl mesh underlay and fascial closure on 7/27.  -NPO, NG tube in place -GI management per gen surgery -pain management with PCA -DM/fluid management: per critical care team - monitor urine output -pharmacy consult for TPN  Will continue to follow appreciate recs from general surgery and critical care team  DVT Prophylaxis:  Lovenox Code Status: Full  Catalina Antigua, MD Attending Obstetrician & Gynecologist, Faculty Practice  Center for [employer], [facility]

## 2023-05-29 NOTE — Evaluation (Signed)
Physical Therapy Evaluation Patient Details Name: Maria [name] MRN: [id number] DOB: 1981-03-28 Today's Date: 05/29/2023  History of Present Illness  Pt is 42 yo female presenting to [facility] on 7/27 after family found her delirious. Pt had recently been discharged from hospital on 7/25 following ex lap of L hemorrhagic ovarian cyst and extensive intra abdominal LOA, sigmoid colon serosal tear repair. Pt is currently s/p exploratory laparotomy with hernia reduction, vicryl mesh underlay and fascial closure on 7/27. PMH: DM and depression  Clinical Impression  Pt is presenting below baseline. Pt mobility deficits are related to pain from surgery. Currently pt is CGA to Min A for bed mobility, sit to stand and taking steps. Ambulation and stairs were deferred this date due to continuous NGT suctioning. Pt will benefit from continued skilled physical therapy services while in the acute care setting in order to ensure that pt returns home with a decreased need for assist and able to mobilize while protecting surgical site with a decreased risk for falls and injury. No recommended skilled physical therapy services recommended on discharge from acute care setting at this time. Pt HR/O2 sats remained WNL throughout session      If plan is discharge home, recommend the following: A little help with walking and/or transfers;Assistance with cooking/housework;Assist for transportation;Help with stairs or ramp for entrance   Can travel by private vehicle        Equipment Recommendations BSC/3in1;Other (comment) (shower chair)  Recommendations for Other Services       Functional Status Assessment Patient has had a recent decline in their functional status and demonstrates the ability to make significant improvements in function in a reasonable and predictable amount of time.     Precautions / Restrictions Precautions Precautions: Other (comment) Precaution Comments: abdominal Restrictions Weight  Bearing Restrictions: No      Mobility  Bed Mobility Overal bed mobility: Needs Assistance Bed Mobility: Rolling, Sidelying to Sit, Sit to Supine Rolling: Min assist Sidelying to sit: Min assist   Sit to supine: Min assist   General bed mobility comments: Verbal cues for sequencing of log roll with Min A at the pelvis for rolling and Min A for bil LE for sitting to supine with difficulty due to height of the bed. Patient Response: Cooperative  Transfers Overall transfer level: Needs assistance Equipment used: 1 person hand held assist Transfers: Sit to/from Stand Sit to Stand: Min guard           General transfer comment: Pt reports pressure at her abdomen with standing.    Ambulation/Gait Ambulation/Gait assistance: Min assist Gait Distance (Feet): 2 Feet Assistive device: 1 person hand held assist         General Gait Details: Pt is attached to NGT on continuous suctioning currently took some side steps at EOB at hand held assist with very small steps and low clearance due to pain in the abdomen.    Tilt Bed Tilt Bed Patient Response: Cooperative  Modified Rankin (Stroke Patients Only)       Balance Overall balance assessment: Mild deficits observed, not formally tested           Pertinent Vitals/Pain Pain Assessment Pain Assessment: Faces Faces Pain Scale: Hurts a little bit Pain Descriptors / Indicators: Grimacing, Guarding Pain Intervention(s): Limited activity within patient's tolerance, Monitored during session, PCA encouraged    Home Living Family/patient expects to be discharged to:: Private residence Living Arrangements: Spouse/significant other;Children Available Help at Discharge: Family;Available 24 hours/day Type of Home: House  Home Access: Stairs to enter Entrance Stairs-Rails: Can reach both;Left;Right Entrance Stairs-Number of Steps: 3   Home Layout: One level Home Equipment: None Additional Comments: Pt is going to stay with  her sister at her house who will be available 24/7    Prior Function Prior Level of Function : Independent/Modified Independent;Working/employed;Driving             Mobility Comments: Pt ambulated without an AD was working and driving       Higher education careers adviser        Extremity/Trunk Assessment   Upper Extremity Assessment Upper Extremity Assessment: Overall WFL for tasks assessed    Lower Extremity Assessment Lower Extremity Assessment: Overall WFL for tasks assessed    Cervical / Trunk Assessment Cervical / Trunk Assessment: Normal  Communication   Communication: No difficulties  Cognition Arousal/Alertness: Awake/alert Behavior During Therapy: WFL for tasks assessed/performed Overall Cognitive Status: Within Functional Limits for tasks assessed        General Comments General comments (skin integrity, edema, etc.): Overall warm, dry and intact. Drainage from incision in bulb drain.        Assessment/Plan    PT Assessment Patient needs continued PT services  PT Problem List Decreased mobility;Decreased activity tolerance;Pain       PT Treatment Interventions DME instruction;Therapeutic activities;Gait training;Therapeutic exercise;Patient/family education;Stair training;Balance training;Functional mobility training    PT Goals (Current goals can be found in the Care Plan section)  Acute Rehab PT Goals Patient Stated Goal: Go to her sisters house on discharge PT Goal Formulation: With patient Time For Goal Achievement: 06/12/23 Potential to Achieve Goals: Good    Frequency Min 1X/week        AM-PAC PT "6 Clicks" Mobility  Outcome Measure Help needed turning from your back to your side while in a flat bed without using bedrails?: A Little Help needed moving from lying on your back to sitting on the side of a flat bed without using bedrails?: A Little Help needed moving to and from a bed to a chair (including a wheelchair)?: A Little Help needed standing  up from a chair using your arms (e.g., wheelchair or bedside chair)?: A Little Help needed to walk in hospital room?: A Little Help needed climbing 3-5 steps with a railing? : A Little 6 Click Score: 18    End of Session   Activity Tolerance: Patient tolerated treatment well Patient left: with call bell/phone within reach;in bed;with family/visitor present Nurse Communication: Mobility status PT Visit Diagnosis: Other abnormalities of gait and mobility (R26.89)    Time: 5784-6962 PT Time Calculation (min) (ACUTE ONLY): 29 min   Charges:   PT Evaluation $PT Eval Low Complexity: 1 Low PT Treatments $Therapeutic Activity: 8-22 mins PT General Charges $$ ACUTE PT VISIT: 1 Visit        Harrel Carina, DPT, CLT  Acute Rehabilitation Services Office: [phone] (Secure chat preferred)   Claudia Desanctis 05/29/2023, 2:03 PM

## 2023-05-29 NOTE — TOC CM/SW Note (Signed)
Transition of Care [facility]) - Inpatient Brief Assessment   Patient Details  Name: Maria [name] MRN: [id number] Date of Birth: 20-Aug-1981  Transition of Care [facility]) CM/SW Contact:    Mearl Latin, LCSW Phone Number: 05/29/2023, 6:00 PM   Clinical Narrative: Patient admitted from home with family. Patient may require DME at discharge so TOC to follow.    Transition of Care Asessment: Insurance and Status: Insurance coverage has been reviewed Patient has primary care physician: Yes Home environment has been reviewed: From home Prior level of function:: Independent   Social Determinants of Health Reivew: SDOH reviewed no interventions necessary Readmission risk has been reviewed: Yes Transition of care needs: no transition of care needs at this time

## 2023-05-29 NOTE — Progress Notes (Signed)
NAME:  Maria [name], MRN:  [id number], DOB:  [birth year], LOS: 2 ADMISSION DATE:  05/27/2023, CONSULTATION DATE:  05/27/23 REFERRING MD:  Lanae Crumbly (OB), CHIEF COMPLAINT:  hypoglycemia   History of Present Illness:  42 yo F PMH DM on glyburide, depression, who is POD 7 (Op date 7/20) ex lap evac of L hemorrhagic ovarian cyst, and extensive intraabdominal LOA, sigmoid colon serosal tear repair. She remained in the hospital until dc 7/25. Prior to dc she had + BM and + flatus. On 7/26 in evening pt started to have abd distension, pain. She began vomiting 7/27 approx 0100, and has had ongoing vomiting since. She has not had any add'l BM since leaving hospital. Has tried to eat but vomiting food. She ultimately was brought to MAU by family after they found her 7/27 to be delirious, checked her CBG at home and was hypoglycemic -- 38.   In MAU she remained hypoglycemic requiring dextrose paste, d50 amps, and ultimately d5 gtt. She continued to vomit in MAU. KUB obtained which revealed dilated small bowel loops, concerning for possible ileus.   OB and CCS were notified of pts arrival.   PCCM is also consulted in this setting   Pertinent  Medical History  DM Hemorrhagic ovarian cyst  Intraabdominal adhesions   Significant Hospital Events: Including procedures, antibiotic start and stop dates in addition to other pertinent events   7/20 - ex lap hemorrhagic ovarian cyst evac + abd LOA + sigmoid colon serosal tear repair. 7/25 dc home 7/27 admit to ICU with hypoglycemia, concern for ileus. S/p re-exploration, hernia reduction, Vicryl mesh underlay and fascial closure on   Interim History / Subjective:  POD2 ex lap hernia reduction with mesh underlay and fascial closure   On PCA and d5 drip  Objective   Blood pressure 103/68, pulse (!) 126, temperature (!) 100.4 F (38 C), temperature source Oral, resp. rate (!) 3, height 4' 9.01" (1.448 m), weight 83.6 kg, last menstrual period  05/12/2015, SpO2 96%.        Intake/Output Summary (Last 24 hours) at 05/29/2023 0743 Last data filed at 05/29/2023 4098 Gross per 24 hour  Intake 2621.34 ml  Output 730 ml  Net 1891.34 ml   Filed Weights   05/27/23 1553 05/28/23 0437  Weight: 72.6 kg 83.6 kg    Examination: Gen:      No acute distress, ill-appearing HEENT:  EOMI, sclera anicteric Neck:     No masses; no thyromegaly Lungs:    Clear to auscultation bilaterally; normal respiratory effort CV:         Regular rate and rhythm; no murmurs Abd:   Abdominal binder, JP drain Ext:    No edema; adequate peripheral perfusion Skin:      Warm and dry; no rash Neuro: alert and oriented x 3 Psych: normal mood and affect   Labs/imaging reviewed Significant for BUN/creatinine 24/1.49 WBC 17.5, hemoglobin 10.3, platelets 317 No new imaging   Resolved Hospital Problem list   Acute encephalopathy  Hypokalemia Hypomagnesemia   Assessment & Plan:  DM2, hyperglycemia -most likely poor intake + vomiting + taking home sulfonyurea-- d/w ccm md and its felt that this can take 24+ to resolve  P Wean dextrose drip as tolerated. Continue to monitor blood sugar   Hernia with bowel obstruction, fascial dehiscence -- s/p ex lap hernia reduction, fascia closure (7/27)  Recent ex lap evac of hemorrhagic ovarian cyst, extensive LOA and sigmoid colon serosal repait (7/20)  P -post  op per CCS -N.p.o., NG tube to intermittent suction -TPN for nutrition  Leukocytosis, low grade fever -reactive v infectious, favor reactive  P Observe off antibiotics.  AKI Give LR bolus Monitor urine output and Cr.   Best Practice (right click and "Reselect all SmartList Selections" daily)   Diet/type: NPO, TPN DVT prophylaxis: LMWH GI prophylaxis: N/A Lines: Central line Foley:  Yes, and it is still needed Code Status:  full code Last date of multidisciplinary goals of care discussion [7/27]  Critical care time: NA   Chilton Greathouse  MD [facility] See Amion for pager  If no response to pager , please call [phone] until 7pm After 7:00 pm call Elink  [phone] 05/29/2023, 7:52 AM

## 2023-05-29 NOTE — Progress Notes (Addendum)
PHARMACY - TOTAL PARENTERAL NUTRITION CONSULT NOTE   Indication: Small bowel obstruction and possible ileus  Patient Measurements: Height: 4' 9.01" (144.8 cm) Weight: 86.8 kg (191 lb 5.8 oz) IBW/kg (Calculated) : 38.62 TPN AdjBW (KG): 47.1 Body mass index is 41.4 kg/m. Usual Weight: patient reports she is typically 193 lbs (87.7 kg)  Assessment: 41yo female who presented 7/27 following previous 7/20-25 where she underwent exlap with lysis of adhesions, serosa repair, evacuation of hemorrhagic ovarian cyst. Diet was advanced to normal over course of admission with emesis POD3-4 but overall tolerated with normal bowel sounds, BM and flatus prior to discharge 7/25. Pt began to experience abdominal distension and cramping 7/26 that progressed to vomiting 7/27 and has been unable to maintain PO intake. Underwent re-exlap 7/27, hernia reduction, Vicryl mesh underlay, fascial closure. Pharmacy is consulted to initiate TPN.   Glucose / Insulin: CBG <180 on dextrose containing fluids - 6u /24h SSI Electrolytes: K 3.6, Ca 7.7, Phos 3.6, otherwise WNL Renal: Scr 1.49, BUN 24 Hepatic: LFTs stable  Intake / Output; MIVF: drain 85, UOP 0.2 ml/kg/hr, net +3.4L GI Imaging: GI Surgeries / Procedures:  Central access: PICC TPN start date: 7/29 PM  Nutritional Goals:  Goal rate TPN 105 ml/hr to provide 2371 kcal and 151 g protein (100% of estimated needs)  RD Assessment: Estimated Needs Total Energy Estimated Needs: 2300-2500 kcals Total Protein Estimated Needs: 145-160 gm Total Fluid Estimated Needs: >/= 2.3 L  Current Nutrition:  NPO  Plan:  Initiate TPN at 74ml/hr (50% rate to provide ~50% of estimated needs)  -noted new recs for macros for tomorrow's bag- appreciate RD assistance Electrolytes in TPN: Na 50 mEq/L, K 40 mEq/L, Ca 7 mEq/L, Mg 4 mEq/L, and Phos 15 mmol/L. Cl:Ac 1:1 Famotidine to TPN Discontinue maintenance fluids at 7/29 @1800   Adjust Sensitive q4h SSI and adjust as needed   Monitor TPN labs on Mon/Thurs, daily until stable   Calton Dach, PharmD Clinical Pharmacist 05/29/2023 9:45 AM

## 2023-05-29 NOTE — Progress Notes (Signed)
Progress Note  2 Days Post-Op  Subjective: Pt reports significant abdominal pain and feeling exhausted. No bowel function yet. Has not been out of bed since surgery.   Objective: Vital signs in last 24 hours: Temp:  [99 F (37.2 C)-101.2 F (38.4 C)] 99 F (37.2 C) (07/29 0720) Pulse Rate:  [109-143] 109 (07/29 0900) Resp:  [0-30] 23 (07/29 0900) BP: (87-123)/(56-91) 113/70 (07/29 0900) SpO2:  [95 %-99 %] 98 % (07/29 0900) FiO2 (%):  [2 %] 2 % (07/29 0819) Weight:  [86.8 kg] 86.8 kg (07/29 0800) Last BM Date : 05/19/23  Intake/Output from previous day: 07/28 0701 - 07/29 0700 In: 2773.1 [I.V.:1407.3; NG/GT:20; IV Piggyback:1345.8] Out: 730 [Urine:345; Emesis/NG output:300; Drains:85] Intake/Output this shift: Total I/O In: 50 [I.V.:50] Out: 80 [Urine:55; Drains:25]  PE: General: pleasant, WD, WN female who is laying in bed in NAD Heart: sinus tachy Lungs: Respiratory effort nonlabored Abd: appropriately ttp, incision with honeycomb present and some bloody drainage, abdominal binder present, drain, NGT with bilious output Psych: A&Ox3 with an appropriate affect.    Lab Results:  Recent Labs    05/28/23 0010 05/29/23 0532  WBC 15.1* 17.5*  HGB 12.3 10.3*  HCT 35.5* 30.8*  PLT 323 317   BMET Recent Labs    05/28/23 0010 05/29/23 0532  NA 138 137  K 3.6 3.6  CL 100 103  CO2 28 27  GLUCOSE 141* 172*  BUN 7 24*  CREATININE 0.73 1.49*  CALCIUM 8.2* 7.7*   PT/INR No results for input(s): "LABPROT", "INR" in the last 72 hours. CMP     Component Value Date/Time   NA 137 05/29/2023 0532   K 3.6 05/29/2023 0532   CL 103 05/29/2023 0532   CO2 27 05/29/2023 0532   GLUCOSE 172 (H) 05/29/2023 0532   BUN 24 (H) 05/29/2023 0532   CREATININE 1.49 (H) 05/29/2023 0532   CALCIUM 7.7 (L) 05/29/2023 0532   PROT 8.0 05/27/2023 1243   ALBUMIN 4.0 05/27/2023 1243   AST 36 05/27/2023 1243   ALT 46 (H) 05/27/2023 1243   ALKPHOS 69 05/27/2023 1243   BILITOT 0.6  05/27/2023 1243   GFRNONAA 45 (L) 05/29/2023 0532   GFRAA >60 05/31/2018 1753   Lipase     Component Value Date/Time   LIPASE 38 05/27/2023 1243       Studies/Results: [place] EKG SITE RITE  Result Date: 05/28/2023 If Site Rite image not attached, placement could not be confirmed due to current cardiac rhythm.  CT ABDOMEN PELVIS W CONTRAST  Result Date: 05/27/2023 CLINICAL DATA:  Acute generalized abdominal pain EXAM: CT ABDOMEN AND PELVIS WITH CONTRAST TECHNIQUE: Multidetector CT imaging of the abdomen and pelvis was performed using the standard protocol following bolus administration of intravenous contrast. RADIATION DOSE REDUCTION: This exam was performed according to the departmental dose-optimization program which includes automated exposure control, adjustment of the mA and/or kV according to patient size and/or use of iterative reconstruction technique. CONTRAST:  75mL OMNIPAQUE IOHEXOL 350 MG/ML SOLN COMPARISON:  May 19, 2023. FINDINGS: Lower chest: No acute abnormality. Hepatobiliary: No focal liver abnormality is seen. No gallstones, gallbladder wall thickening, or biliary dilatation. Pancreas: Unremarkable. No pancreatic ductal dilatation or surrounding inflammatory changes. Spleen: Normal in size without focal abnormality. Adrenals/Urinary Tract: Adrenal glands are unremarkable. Kidneys are normal, without renal calculi, focal lesion, or hydronephrosis. Bladder is unremarkable. Stomach/Bowel: There is now noted moderate to severe gastric distention as well as moderate to severe small bowel dilatation due to interval  development of large ventral hernia seen anteriorly in the pelvis. This hernia contains multiple loops of small bowel, several which are dilated. This appears to be resulting at least partial obstruction of the more proximal small bowel loops. The colon is unremarkable and contains stool. The appendix is unremarkable. 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 No other vascular abnormality is noted. No adenopathy is noted. Reproductive: Status post hysterectomy and bilateral salpingectomy. No definite adnexal abnormality is noted currently. Other: No definite ascites is noted. Postsurgical staples are seen involving the lower abdominal wall and along the inferior margin of previously described large hernia. Musculoskeletal: No acute or significant osseous findings. IMPRESSION: There is interval development of large ventral hernia anteriorly in the pelvis which contains multiple loops of small bowel, several of which are dilated. This appears to be resulting in at least partial obstruction of the more proximal small bowel loops, as well as moderate to severe gastric distention. There are noted surgical clips involving the lower anterior abdominal wall in the vicinity of the hernia. Electronically Signed   By: Lupita Raider M.D.   On: 05/27/2023 15:15   CT HEAD WO CONTRAST ( )  Result Date: 05/27/2023 CLINICAL DATA:  Delirium. EXAM: CT HEAD WITHOUT CONTRAST TECHNIQUE: Contiguous axial images were obtained from the base of the skull through the vertex without intravenous contrast. RADIATION DOSE REDUCTION: This exam was performed according to the departmental dose-optimization program which includes automated exposure control, adjustment of the mA and/or kV according to patient size and/or use of iterative reconstruction technique. COMPARISON:  05/25/2014 FINDINGS: Brain: No evidence of intracranial hemorrhage, acute infarction, hydrocephalus, extra-axial collection, or mass lesion/mass effect. Vascular:  No hyperdense vessel or other acute findings. Skull: No evidence of fracture or other significant bone abnormality. Sinuses/Orbits:  No acute findings. Other: None. IMPRESSION: Negative. Electronically Signed   By: Danae Orleans M.D.   On: 05/27/2023 15:09   DG Abdomen 1  View  Result Date: 05/27/2023 CLINICAL DATA:  Nausea and vomiting EXAM: ABDOMEN - 1 VIEW COMPARISON:  None Available. FINDINGS: Multiple dilated loops of small bowel throughout the midabdomen. There is gas and stool in the colon. Surgical changes along the pelvis. Films are under penetrated. No obvious free air on these portable supine radiographs. The diaphragm is clipped off the edge of the film. IMPRESSION: Surgical changes along the pelvis. Multiple dilated loops of bowel in the midabdomen. Ileus versus obstruction is possible. Please correlate with clinical presentation and if needed follow up IV contrast CT scan when appropriate Electronically Signed   By: Karen Kays M.D.   On: 05/27/2023 13:49   DG CHEST PORT 1 VIEW  Result Date: 05/27/2023 CLINICAL DATA:  Hypoxemia.  Nausea and vomiting EXAM: PORTABLE CHEST 1 VIEW COMPARISON:  X-ray 05/31/2018 FINDINGS: No consolidation, pneumothorax or effusion. No edema. Normal cardiopericardial silhouette. IMPRESSION: No acute cardiopulmonary disease. Electronically Signed   By: Karen Kays M.D.   On: 05/27/2023 13:43    Anti-infectives: Anti-infectives (From admission, onward)    Start     Dose/Rate Route Frequency Ordered Stop   05/27/23 1545  ceFAZolin (ANCEF) IVPB 2g/100 mL premix       Placed in "And" Linked Group   2 g 200 mL/hr over 30 Minutes Intravenous On call to O.R. 05/27/23 1539 05/27/23 1655   05/27/23 1545  metroNIDAZOLE (FLAGYL) IVPB 500 mg       Placed in "And" Linked  Group   500 mg 100 mL/hr over 60 Minutes Intravenous On call to O.R. 05/27/23 1539 05/27/23 1753   05/27/23 1545  metroNIDAZOLE (FLAGYL) 500 MG/100ML IVPB       Note to Pharmacy: Crissie Sickles: cabinet override      05/27/23 1545 05/27/23 1653   05/27/23 1545  ceFAZolin (ANCEF) 2-4 GM/100ML-% IVPB       Note to Pharmacy: Crissie Sickles: cabinet override      05/27/23 1545 05/27/23 1651        Assessment/Plan S/p  ex-lap with extensive LOA, evacuation of  hemorrhagic ovarian cyst and serosal repair of colon 05/20/23  Fascial dehiscence of Pfannenstiel incision and small bowel herniation with obstruction  POD2 s/p re-exploration, hernia reduction, vicryl mesh underlay and fascial closure   - ileus post-op as expected - TPN, continue NGT on LIWS until return in bowel function  - binder and continue drain in SQ - mobilize as able, PT consult placed today  - ok to continue foley until mobilizing but should consider removal in the next 24-48 hr - PCA for pain control, added IV tylenol, stopping toradol with increase in sCr - check CXR and UA for increasing WBC - ok to transfer out of ICU from surgery standpoint   FEN: NPO, IVF, TPN, NGT to LIWS VTE: LMWH ID: no current abx, WBC 17K, Tmax for last 24h 100.4  - per primary attending -  T2DM AKI on CKD  LOS: 2 days     Juliet Rude, [facility] 05/29/2023, 10:39 AM Please see Amion for pager number during day hours 7:00am-4:30pm

## 2023-05-30 DIAGNOSIS — K567 Ileus, unspecified: Secondary | ICD-10-CM

## 2023-05-30 LAB — GLUCOSE, CAPILLARY
Glucose-Capillary: 202 mg/dL — ABNORMAL HIGH (ref 70–99)
Glucose-Capillary: 183 mg/dL — ABNORMAL HIGH (ref 70–99)
Glucose-Capillary: 160 mg/dL — ABNORMAL HIGH (ref 70–99)
Glucose-Capillary: 170 mg/dL — ABNORMAL HIGH (ref 70–99)
Glucose-Capillary: 174 mg/dL — ABNORMAL HIGH (ref 70–99)

## 2023-05-30 MED ORDER — INSULIN ASPART 100 UNIT/ML IJ SOLN
0.0000 [IU] | INTRAMUSCULAR | Status: DC
  Administered 2023-05-30 – 2023-06-03 (×23): 3 [IU] via SUBCUTANEOUS

## 2023-05-30 MED ORDER — HEPARIN SODIUM (PORCINE) 5000 UNIT/ML IJ SOLN
5000.0000 [IU] | Freq: Three times a day (TID) | INTRAMUSCULAR | Status: DC
  Administered 2023-05-30: 5000 [IU] via SUBCUTANEOUS
  Filled 2023-05-30: qty 1

## 2023-05-30 MED ORDER — SODIUM CHLORIDE 0.9 % IV SOLN
INTRAVENOUS | Status: DC | PRN
  Administered 2023-05-30 (×2): 10 mL/h via INTRAVENOUS

## 2023-05-30 MED ORDER — HYDROMORPHONE HCL 1 MG/ML IJ SOLN
0.5000 mg | INTRAMUSCULAR | Status: DC | PRN
  Administered 2023-05-30 (×2): 2 mg via INTRAVENOUS
  Administered 2023-05-30: 0.5 mg via INTRAVENOUS
  Administered 2023-05-30 (×2): 1 mg via INTRAVENOUS
  Administered 2023-05-31 – 2023-06-04 (×26): 2 mg via INTRAVENOUS
  Filled 2023-05-30 (×2): qty 2
  Filled 2023-05-30: qty 1
  Filled 2023-05-30 (×5): qty 2
  Filled 2023-05-30: qty 0.5
  Filled 2023-05-30 (×5): qty 2
  Filled 2023-05-30: qty 1
  Filled 2023-05-30 (×2): qty 2
  Filled 2023-05-30: qty 0.5
  Filled 2023-05-30 (×8): qty 2
  Filled 2023-05-30: qty 1.5
  Filled 2023-05-30 (×4): qty 2
  Filled 2023-05-30: qty 1
  Filled 2023-05-30: qty 2
  Filled 2023-05-30: qty 1
  Filled 2023-05-30: qty 2

## 2023-05-30 MED ORDER — ACETAMINOPHEN 10 MG/ML IV SOLN
1000.0000 mg | Freq: Four times a day (QID) | INTRAVENOUS | Status: AC
  Administered 2023-05-30 – 2023-05-31 (×4): 1000 mg via INTRAVENOUS
  Filled 2023-05-30 (×4): qty 100

## 2023-05-30 MED ORDER — TRAVASOL 10 % IV SOLN
INTRAVENOUS | Status: AC
  Filled 2023-05-30: qty 820.8

## 2023-05-30 MED ORDER — HEPARIN SODIUM (PORCINE) 5000 UNIT/ML IJ SOLN
5000.0000 [IU] | Freq: Three times a day (TID) | INTRAMUSCULAR | Status: DC
  Administered 2023-05-30 – 2023-06-03 (×11): 5000 [IU] via SUBCUTANEOUS
  Filled 2023-05-30 (×11): qty 1

## 2023-05-30 MED ORDER — SODIUM CHLORIDE 0.9 % IV SOLN
15.0000 mmol | Freq: Once | INTRAVENOUS | Status: AC
  Administered 2023-05-30: 15 mmol via INTRAVENOUS
  Filled 2023-05-30: qty 5

## 2023-05-30 NOTE — Progress Notes (Signed)
10 cc Dilaudid from PCA wasted. Witnessed by Jeannie Done, RN

## 2023-05-30 NOTE — Progress Notes (Signed)
eLink Physician-Brief Progress Note Patient Name: Maria [name] DOB: 1981-09-22 MRN: [id number]   Date of Service  05/30/2023  HPI/Events of Note  Hr ranging 120-130, has been increasing slowly.   Received LR bolus today for AKI. has no maintainence IVF running. UOP about 1800 today so far.   eICU Interventions  Continue Observation.   Patient +1.2L for the past 24hr.      Intervention Category Minor Interventions: Clinical assessment - ordering diagnostic tests  Maria [name] 05/30/2023, 12:42 AM

## 2023-05-30 NOTE — Progress Notes (Signed)
Report called to RN on [facility]. Patient transferred via wheelchair to 4E 02 without incident. VSS

## 2023-05-30 NOTE — Plan of Care (Signed)
  Problem: Education: Goal: Knowledge of the prescribed therapeutic regimen will improve Outcome: Progressing Goal: Understanding of sexual limitations or changes related to disease process or condition will improve Outcome: Progressing Goal: Individualized Educational Video(s) Outcome: Progressing   Problem: Self-Concept: Goal: Communication of feelings regarding changes in body function or appearance will improve Outcome: Progressing   Problem: Skin Integrity: Goal: Demonstration of wound healing without infection will improve Outcome: Progressing   Problem: Education: Goal: Knowledge of General Education information will improve Description: Including pain rating scale, medication(s)/side effects and non-pharmacologic comfort measures Outcome: Progressing   Problem: Clinical Measurements: Goal: Ability to maintain clinical measurements within normal limits will improve Outcome: Progressing Goal: Will remain free from infection Outcome: Progressing

## 2023-05-30 NOTE — Progress Notes (Signed)
PROGRESS NOTE    Maria [name]  ZOX:[id number] DOB: September 23, 1981 DOA: 05/27/2023 PCP: Deatra James, MD    Brief Narrative:  42 yo F PMH DM on glyburide, depression, who is POD 7 (Op date 7/20) ex lap evac of L hemorrhagic ovarian cyst, and extensive intraabdominal LOA, sigmoid colon serosal tear repair. She remained in the hospital until dc 7/25. Prior to dc she had + BM and + flatus. On 7/27 admited to ICU with hypoglycemia, concern for ileus. S/p re-exploration, hernia reduction, Vicryl mesh underlay and fascial closure.    Assessment and Plan: DM2, hypoglycemia -most likely poor intake + vomiting + taking home sulfonylurea -on TPN Continue to monitor blood sugar    Hernia with bowel obstruction, fascial dehiscence  -- s/p ex lap hernia reduction, fascia closure (7/27)  Recent ex lap evac of hemorrhagic ovarian cyst, extensive LOA and sigmoid colon serosal repait (7/20)  -post op per [facility] including wound care -N.p.o., NG tube to intermittent suction -TPN for nutrition   Leukocytosis, low grade fever -reactive v infectious, favor reactive  Observe off antibiotics. -U/A negative, chest x ray negative   AKI -resolved  Low phos -replace in TPN  Anemia -trend   DVT prophylaxis: heparin injection 5,000 Units Start: 05/30/23 0930    Code Status: Full Code   Disposition Plan:  Level of care: Progressive Status is: Inpatient Remains inpatient appropriate    Consultants:  PCCM GYN GS   Subjective: Some drainage from wound  Objective: Vitals:   05/30/23 0500 05/30/23 0600 05/30/23 0810 05/30/23 0827  BP: 121/80 127/89    Pulse: (!) 112 (!) 112    Resp: (!) 25 (!) 23  (!) 23  Temp:   98.4 F (36.9 C)   TempSrc:   Axillary   SpO2: 98% 97%  98%  Weight:      Height:        Intake/Output Summary (Last 24 hours) at 05/30/2023 0914 Last data filed at 05/30/2023 0900 Gross per 24 hour  Intake 2613.03 ml  Output 2915 ml  Net -301.97 ml   Filed Weights    05/27/23 1553 05/28/23 0437 05/29/23 0800  Weight: 72.6 kg 83.6 kg 86.8 kg    Examination:   General: Appearance:    Severely obese female who appears anxious     Lungs:     Diminished, respirations unlabored  Heart:    Tachycardic. Normal rhythm. No murmurs, rubs, or gallops.    MS:   All extremities are intact.    Neurologic:   Awake, alert, oriented x 3. No apparent focal neurological           defect.        Data Reviewed: I have personally reviewed following labs and imaging studies  CBC: Recent Labs  Lab 05/27/23 1243 05/27/23 1758 05/27/23 2018 05/28/23 0010 05/29/23 0532 05/30/23 0001  WBC 15.4*  --  14.3* 15.1* 17.5* 14.6*  HGB 14.9 13.3 12.4 12.3 10.3* 10.1*  HCT 43.1 39.0 36.4 35.5* 30.8* 30.1*  MCV 83.0  --  84.3 84.5 86.5 85.8  PLT 426*  --  335 323 317 316   Basic Metabolic Panel: Recent Labs  Lab 05/24/23 0501 05/27/23 1243 05/27/23 1758 05/27/23 2018 05/28/23 0010 05/29/23 0532 05/30/23 0001  NA 133* 139 140 138 138 137 135  K 3.5 2.9* 3.5 3.2* 3.6 3.6 3.7  CL 98 96* 98 100 100 103 102  CO2 26 31  --  28 28 27 24   GLUCOSE  162* 30* 96 85 141* 172* 226*  BUN 12 5* 6 6 7  24* 15  CREATININE 0.86 0.77 0.70 0.74 0.73 1.49* 0.85  CALCIUM 8.1* 9.6  --  8.6* 8.2* 7.7* 8.1*  MG 2.1 2.2  --   --  1.7 2.4 2.2  PHOS  --   --   --   --  5.2* 3.6 1.6*   GFR: Estimated Creatinine Clearance: 79.6 mL/min (by C-G formula based on SCr of 0.85 mg/dL). Liver Function Tests: Recent Labs  Lab 05/27/23 1243 05/30/23 0001  AST 36 27  ALT 46* 21  ALKPHOS 69 53  BILITOT 0.6 0.4  PROT 8.0 5.5*  ALBUMIN 4.0 2.5*   Recent Labs  Lab 05/27/23 1243  LIPASE 38   No results for input(s): "AMMONIA" in the last 168 hours. Coagulation Profile: No results for input(s): "INR", "PROTIME" in the last 168 hours. Cardiac Enzymes: No results for input(s): "CKTOTAL", "CKMB", "CKMBINDEX", "TROPONINI" in the last 168 hours. BNP (last 3 results) No results for  input(s): "PROBNP" in the last 8760 hours. HbA1C: No results for input(s): "HGBA1C" in the last 72 hours. CBG: Recent Labs  Lab 05/29/23 1801 05/29/23 1929 05/29/23 2316 05/30/23 0315 05/30/23 0808  GLUCAP 156* 180* 218* 202* 183*   Lipid Profile: Recent Labs    05/30/23 0001  TRIG 170*   Thyroid Function Tests: No results for input(s): "TSH", "T4TOTAL", "FREET4", "T3FREE", "THYROIDAB" in the last 72 hours. Anemia Panel: No results for input(s): "VITAMINB12", "FOLATE", "FERRITIN", "TIBC", "IRON", "RETICCTPCT" in the last 72 hours. Sepsis Labs: No results for input(s): "PROCALCITON", "LATICACIDVEN" in the last 168 hours.  Recent Results (from the past 240 hour(s))  MRSA Next Gen by PCR, Nasal     Status: None   Collection Time: 05/27/23  8:06 PM   Specimen: Nasal Mucosa; Nasal Swab  Result Value Ref Range Status   MRSA by PCR Next Gen NOT DETECTED NOT DETECTED Final    Comment: (NOTE) The GeneXpert MRSA Assay (FDA approved for NASAL specimens only), is one component of a comprehensive MRSA colonization surveillance program. It is not intended to diagnose MRSA infection nor to guide or monitor treatment for MRSA infections. Test performance is not FDA approved in patients less than 24 years old. Performed at [facility], [address], [place], [place] [address]          Radiology Studies: DG CHEST PORT 1 VIEW  Result Date: 05/29/2023 CLINICAL DATA:  Nausea, emesis, leukocytosis EXAM: PORTABLE CHEST 1 VIEW COMPARISON:  Chest radiograph 2 days prior FINDINGS: The enteric catheter tip is in the stomach. The side-hole is suspected to be at the level of the GE junction. The right upper extremity PICC tip is in the right atrium. The cardiomediastinal silhouette is stable, allowing for lower lung volumes Lung volumes are low with asymmetric elevation of the right hemidiaphragm. There is no focal airspace consolidation. There is no pulmonary edema. There is no  significant pleural effusion. There is no pneumothorax There is no acute osseous abnormality. IMPRESSION: 1. Enteric catheter side hole suspected to be at the level of the GE junction. Recommend advancement by approximately 5 cm. 2. Low lung volumes.  No focal consolidation or pleural effusion. Electronically Signed   By: Lesia Hausen M.D.   On: 05/29/2023 14:30        Scheduled Meds:  Chlorhexidine Gluconate Cloth  6 each Topical Daily   heparin injection (subcutaneous)  5,000 Units Subcutaneous Q8H   insulin  aspart  0-9 Units Subcutaneous Q4H   sodium chloride flush  10-40 mL Intracatheter Q12H   Continuous Infusions:  acetaminophen     potassium PHOSPHATE IVPB (in mmol) 15 mmol (05/30/23 0849)   TPN ADULT (ION) 50 mL/hr at 05/30/23 0800     LOS: 3 days    Time spent: 45 minutes spent on chart review, discussion with nursing staff, consultants, updating family and interview/physical exam; more than 50% of that time was spent in counseling and/or coordination of care.    Joseph Art, DO [facility] Available via Epic secure chat 7am-7pm After these hours, please refer to coverage provider listed on [email or web address] 05/30/2023, 9:14 AM

## 2023-05-30 NOTE — Progress Notes (Signed)
Progress Note  3 Days Post-Op  Subjective: Pt reports better pain control this AM, would like to try IV q2 prn over PCA today. No bowel function yet. She was able to stand up yesterday with PT and agreeable to mobilize more today as able. She has some drainage from incision, dressing taken down with RN at bedside.   Objective: Vital signs in last 24 hours: Temp:  [98.4 F (36.9 C)-99.8 F (37.7 C)] 98.4 F (36.9 C) (07/30 0810) Pulse Rate:  [103-122] 112 (07/30 0600) Resp:  [16-28] 23 (07/30 0827) BP: (112-131)/(76-96) 127/89 (07/30 0600) SpO2:  [97 %-99 %] 98 % (07/30 0827) FiO2 (%):  [28 %-32 %] 28 % (07/30 0827) Last BM Date : 05/19/23  Intake/Output from previous day: 07/29 0701 - 07/30 0700 In: 3662.9 [I.V.:1487.6; IV Piggyback:2135.3] Out: 2635 [Urine:1940; Emesis/NG output:650; Drains:45] Intake/Output this shift: Total I/O In: 50 [I.V.:50] Out: 360 [Urine:360]  PE: General: pleasant, WD, WN female who is laying in bed in NAD Heart: sinus tachy Lungs: Respiratory effort nonlabored Abd: appropriately ttp, incision with some SS drainage and staples present, mild erythema, abdominal binder present, drain SS, NGT with bilious output Psych: A&Ox3 with an appropriate affect.    Lab Results:  Recent Labs    05/29/23 0532 05/30/23 0001  WBC 17.5* 14.6*  HGB 10.3* 10.1*  HCT 30.8* 30.1*  PLT 317 316   BMET Recent Labs    05/29/23 0532 05/30/23 0001  NA 137 135  K 3.6 3.7  CL 103 102  CO2 27 24  GLUCOSE 172* 226*  BUN 24* 15  CREATININE 1.49* 0.85  CALCIUM 7.7* 8.1*   PT/INR No results for input(s): "LABPROT", "INR" in the last 72 hours. CMP     Component Value Date/Time   NA 135 05/30/2023 0001   K 3.7 05/30/2023 0001   CL 102 05/30/2023 0001   CO2 24 05/30/2023 0001   GLUCOSE 226 (H) 05/30/2023 0001   BUN 15 05/30/2023 0001   CREATININE 0.85 05/30/2023 0001   CALCIUM 8.1 (L) 05/30/2023 0001   PROT 5.5 (L) 05/30/2023 0001   ALBUMIN 2.5 (L)  05/30/2023 0001   AST 27 05/30/2023 0001   ALT 21 05/30/2023 0001   ALKPHOS 53 05/30/2023 0001   BILITOT 0.4 05/30/2023 0001   GFRNONAA >60 05/30/2023 0001   GFRAA >60 05/31/2018 1753   Lipase     Component Value Date/Time   LIPASE 38 05/27/2023 1243       Studies/Results: DG CHEST PORT 1 VIEW  Result Date: 05/29/2023 CLINICAL DATA:  Nausea, emesis, leukocytosis EXAM: PORTABLE CHEST 1 VIEW COMPARISON:  Chest radiograph 2 days prior FINDINGS: The enteric catheter tip is in the stomach. The side-hole is suspected to be at the level of the GE junction. The right upper extremity PICC tip is in the right atrium. The cardiomediastinal silhouette is stable, allowing for lower lung volumes Lung volumes are low with asymmetric elevation of the right hemidiaphragm. There is no focal airspace consolidation. There is no pulmonary edema. There is no significant pleural effusion. There is no pneumothorax There is no acute osseous abnormality. IMPRESSION: 1. Enteric catheter side hole suspected to be at the level of the GE junction. Recommend advancement by approximately 5 cm. 2. Low lung volumes.  No focal consolidation or pleural effusion. Electronically Signed   By: Lesia Hausen M.D.   On: 05/29/2023 14:30    Anti-infectives: Anti-infectives (From admission, onward)    Start     Dose/Rate  Route Frequency Ordered Stop   05/27/23 1545  ceFAZolin (ANCEF) IVPB 2g/100 mL premix       Placed in "And" Linked Group   2 g 200 mL/hr over 30 Minutes Intravenous On call to O.R. 05/27/23 1539 05/27/23 1655   05/27/23 1545  metroNIDAZOLE (FLAGYL) IVPB 500 mg       Placed in "And" Linked Group   500 mg 100 mL/hr over 60 Minutes Intravenous On call to O.R. 05/27/23 1539 05/27/23 1753   05/27/23 1545  metroNIDAZOLE (FLAGYL) 500 MG/100ML IVPB       Note to Pharmacy: Crissie Sickles: cabinet override      05/27/23 1545 05/27/23 1653   05/27/23 1545  ceFAZolin (ANCEF) 2-4 GM/100ML-% IVPB       Note to  Pharmacy: Crissie Sickles: cabinet override      05/27/23 1545 05/27/23 1651        Assessment/Plan S/p  ex-lap with extensive LOA, evacuation of hemorrhagic ovarian cyst and serosal repair of colon 05/20/23  Fascial dehiscence of Pfannenstiel incision and small bowel herniation with obstruction  POD3 s/p re-exploration, hernia reduction, vicryl mesh underlay and fascial closure   - ileus post-op as expected - TPN, continue NGT on LIWS until return in bowel function  - binder and continue drain in SQ - mobilize as able - DC foley today - Continue IV tylenol and DC PCA today, ordered IV dilaudid q2 prn for pain control  - WBC trending down, monitor incision - may need to remove some staples if worsening drainage or cellulitis   FEN: NPO, IVF, TPN, NGT to LIWS VTE: SQH ID: no current abx, WBC 14K from 17K, afebrile, UA and CXR negative   - per primary attending -  T2DM AKI on CKD  LOS: 3 days     Juliet Rude, [facility] 05/30/2023, 10:33 AM Please see Amion for pager number during day hours 7:00am-4:30pm

## 2023-05-30 NOTE — Progress Notes (Signed)
Patient ID: Maria [name], female   DOB: 1981-07-28, 42 y.o.   MRN: [id number]           [facility] OB/GYN Attending Progress Note  ADMISSION DIAGNOSIS:   Principal Problem:   Ileus (HCC) Active Problems:   Hypoglycemia   Intestinal obstruction (HCC)   Malnutrition of moderate degree   Subjective   Patient reports feeling well this afternoon. Nausea is improving. No emesis since Sunday evening. Her pain is still present and well controlled.  Denies passing flatus.  Denies fever/chills. She ambulated to the chair with the assistance from the nurse earlier today.   Objective  VITALS:  height is 4' 9.01" (1.448 m) and weight is 83.2 kg. Her oral temperature is 98.9 F (37.2 C). Her blood pressure is 144/93 (abnormal) and her pulse is 111 (abnormal). Her respiration is 26 (abnormal) and oxygen saturation is 91%.   EXAMINATION: CONSTITUTIONAL: no acute distress SKIN: warm and dry, non-diaphoretic NEUROLOGIC: Alert and oriented, no focal deficits CARDIOVASCULAR: Regular rate and rhythm RESPIRATORY: Effort and breath sounds normal, no problems with respiration noted Abdomen: soft, mild distension.  Some BS noted. Appropriately tender.  Abdominal binder in place. Incision with dressing stained with serous fluid- overall clean and dry.  JP drain in place with serosanguinous fluid Ext: SCDs in place, no calf tenderness   Laboratory Reports: Results for orders placed or performed during the hospital encounter of 05/27/23 (from the past 72 hour(s))  Glucose, capillary     Status: Abnormal   Collection Time: 05/27/23  4:36 PM  Result Value Ref Range   Glucose-Capillary 27 (LL) 70 - 99 mg/dL    Comment: Glucose reference range applies only to samples taken after fasting for at least 8 hours.  Glucose, capillary     Status: Abnormal   Collection Time: 05/27/23  5:01 PM  Result Value Ref Range   Glucose-Capillary 178 (H) 70 - 99 mg/dL    Comment: Glucose reference  range applies only to samples taken after fasting for at least 8 hours.  Glucose, capillary     Status: Abnormal   Collection Time: 05/27/23  5:30 PM  Result Value Ref Range   Glucose-Capillary 142 (H) 70 - 99 mg/dL    Comment: Glucose reference range applies only to samples taken after fasting for at least 8 hours.  Glucose, capillary     Status: None   Collection Time: 05/27/23  5:56 PM  Result Value Ref Range   Glucose-Capillary 93 70 - 99 mg/dL    Comment: Glucose reference range applies only to samples taken after fasting for at least 8 hours.  I-STAT, chem 8     Status: None   Collection Time: 05/27/23  5:58 PM  Result Value Ref Range   Sodium 140 135 - 145 mmol/L   Potassium 3.5 3.5 - 5.1 mmol/L   Chloride 98 98 - 111 mmol/L   BUN 6 6 - 20 mg/dL   Creatinine, Ser 7.56 0.44 - 1.00 mg/dL   Glucose, Bld 96 70 - 99 mg/dL    Comment: Glucose reference range applies only to samples taken after fasting for at least 8 hours.   Calcium, Ion 1.17 1.15 - 1.40 mmol/L   TCO2 31 22 - 32 mmol/L   Hemoglobin 13.3 12.0 - 15.0 g/dL   HCT 43.3 29.5 - 18.8 %  Glucose, capillary     Status: Abnormal   Collection Time: 05/27/23  6:29 PM  Result Value Ref Range  Glucose-Capillary 114 (H) 70 - 99 mg/dL    Comment: Glucose reference range applies only to samples taken after fasting for at least 8 hours.  Glucose, capillary     Status: Abnormal   Collection Time: 05/27/23  7:05 PM  Result Value Ref Range   Glucose-Capillary 108 (H) 70 - 99 mg/dL    Comment: Glucose reference range applies only to samples taken after fasting for at least 8 hours.  Glucose, capillary     Status: None   Collection Time: 05/27/23  8:02 PM  Result Value Ref Range   Glucose-Capillary 84 70 - 99 mg/dL    Comment: Glucose reference range applies only to samples taken after fasting for at least 8 hours.  MRSA Next Gen by PCR, Nasal     Status: None   Collection Time: 05/27/23  8:06 PM   Specimen: Nasal Mucosa; Nasal  Swab  Result Value Ref Range   MRSA by PCR Next Gen NOT DETECTED NOT DETECTED    Comment: (NOTE) The GeneXpert MRSA Assay (FDA approved for NASAL specimens only), is one component of a comprehensive MRSA colonization surveillance program. It is not intended to diagnose MRSA infection nor to guide or monitor treatment for MRSA infections. Test performance is not FDA approved in patients less than 44 years old. Performed at [facility], [address], [place], [place] [address]   Basic metabolic panel     Status: Abnormal   Collection Time: 05/27/23  8:18 PM  Result Value Ref Range   Sodium 138 135 - 145 mmol/L   Potassium 3.2 (L) 3.5 - 5.1 mmol/L   Chloride 100 98 - 111 mmol/L   CO2 28 22 - 32 mmol/L   Glucose, Bld 85 70 - 99 mg/dL    Comment: Glucose reference range applies only to samples taken after fasting for at least 8 hours.   BUN 6 6 - 20 mg/dL   Creatinine, Ser 5.09 0.44 - 1.00 mg/dL   Calcium 8.6 (L) 8.9 - 10.3 mg/dL   GFR, Estimated >32 >67 mL/min    Comment: (NOTE) Calculated using the CKD-EPI Creatinine Equation (2021)    Anion gap 10 5 - 15    Comment: Performed at [facility], [address], [place], [place] [address]  CBC     Status: Abnormal   Collection Time: 05/27/23  8:18 PM  Result Value Ref Range   WBC 14.3 (H) 4.0 - 10.5 K/uL   RBC 4.32 3.87 - 5.11 MIL/uL   Hemoglobin 12.4 12.0 - 15.0 g/dL   HCT 09.9 83.3 - 82.5 %   MCV 84.3 80.0 - 100.0 fL   MCH 28.7 26.0 - 34.0 pg   MCHC 34.1 30.0 - 36.0 g/dL   RDW 05.3 97.6 - 73.4 %   Platelets 335 150 - 400 K/uL   nRBC 0.0 0.0 - 0.2 %    Comment: Performed at [facility], [address] [place], [place] [address]  HIV Antibody (routine testing w rflx)     Status: None   Collection Time: 05/27/23  8:18 PM  Result Value Ref Range   HIV Screen 4th Generation wRfx Non Reactive Non Reactive    Comment: Performed at [facility], [address], [place], [place] [address]  Glucose,  capillary     Status: Abnormal   Collection Time: 05/27/23 11:28 PM  Result Value Ref Range   Glucose-Capillary 69 (L) 70 - 99 mg/dL  Comment: Glucose reference range applies only to samples taken after fasting for at least 8 hours.  CBC     Status: Abnormal   Collection Time: 05/28/23 12:10 AM  Result Value Ref Range   WBC 15.1 (H) 4.0 - 10.5 K/uL   RBC 4.20 3.87 - 5.11 MIL/uL   Hemoglobin 12.3 12.0 - 15.0 g/dL   HCT 16.1 (L) 09.6 - 04.5 %   MCV 84.5 80.0 - 100.0 fL   MCH 29.3 26.0 - 34.0 pg   MCHC 34.6 30.0 - 36.0 g/dL   RDW 40.9 81.1 - 91.4 %   Platelets 323 150 - 400 K/uL   nRBC 0.0 0.0 - 0.2 %    Comment: Performed at [facility], [address], [place], [place] [address]  Basic metabolic panel     Status: Abnormal   Collection Time: 05/28/23 12:10 AM  Result Value Ref Range   Sodium 138 135 - 145 mmol/L   Potassium 3.6 3.5 - 5.1 mmol/L   Chloride 100 98 - 111 mmol/L   CO2 28 22 - 32 mmol/L   Glucose, Bld 141 (H) 70 - 99 mg/dL    Comment: Glucose reference range applies only to samples taken after fasting for at least 8 hours.   BUN 7 6 - 20 mg/dL   Creatinine, Ser 6.21 0.44 - 1.00 mg/dL   Calcium 8.2 (L) 8.9 - 10.3 mg/dL   GFR, Estimated >30 >86 mL/min    Comment: (NOTE) Calculated using the CKD-EPI Creatinine Equation (2021)    Anion gap 10 5 - 15    Comment: Performed at [facility], [address], [place], [place] [address]  Magnesium     Status: None   Collection Time: 05/28/23 12:10 AM  Result Value Ref Range   Magnesium 1.7 1.7 - 2.4 mg/dL    Comment: Performed at [facility], [address], [place], [place] [address]  Phosphorus     Status: Abnormal   Collection Time: 05/28/23 12:10 AM  Result Value Ref Range   Phosphorus 5.2 (H) 2.5 - 4.6 mg/dL    Comment: Performed at [facility], [address], [place], [place] [address]  Glucose, capillary     Status: None   Collection Time: 05/28/23  1:08 AM  Result Value Ref Range    Glucose-Capillary 97 70 - 99 mg/dL    Comment: Glucose reference range applies only to samples taken after fasting for at least 8 hours.  Glucose, capillary     Status: Abnormal   Collection Time: 05/28/23  3:09 AM  Result Value Ref Range   Glucose-Capillary 113 (H) 70 - 99 mg/dL    Comment: Glucose reference range applies only to samples taken after fasting for at least 8 hours.  Glucose, capillary     Status: Abnormal   Collection Time: 05/28/23  5:06 AM  Result Value Ref Range   Glucose-Capillary 115 (H) 70 - 99 mg/dL    Comment: Glucose reference range applies only to samples taken after fasting for at least 8 hours.  Glucose, capillary     Status: Abnormal   Collection Time: 05/28/23  7:07 AM  Result Value Ref Range   Glucose-Capillary 138 (H) 70 - 99 mg/dL    Comment: Glucose reference range applies only to samples taken after fasting for at least 8 hours.  Glucose, capillary     Status: Abnormal   Collection Time: 05/28/23  9:00 AM  Result Value  Ref Range   Glucose-Capillary 170 (H) 70 - 99 mg/dL    Comment: Glucose reference range applies only to samples taken after fasting for at least 8 hours.  Glucose, capillary     Status: Abnormal   Collection Time: 05/28/23 11:03 AM  Result Value Ref Range   Glucose-Capillary 198 (H) 70 - 99 mg/dL    Comment: Glucose reference range applies only to samples taken after fasting for at least 8 hours.  Glucose, capillary     Status: Abnormal   Collection Time: 05/28/23  4:46 PM  Result Value Ref Range   Glucose-Capillary 201 (H) 70 - 99 mg/dL    Comment: Glucose reference range applies only to samples taken after fasting for at least 8 hours.  Glucose, capillary     Status: Abnormal   Collection Time: 05/28/23  7:16 PM  Result Value Ref Range   Glucose-Capillary 142 (H) 70 - 99 mg/dL    Comment: Glucose reference range applies only to samples taken after fasting for at least 8 hours.  Glucose, capillary     Status: Abnormal    Collection Time: 05/28/23 11:23 PM  Result Value Ref Range   Glucose-Capillary 153 (H) 70 - 99 mg/dL    Comment: Glucose reference range applies only to samples taken after fasting for at least 8 hours.  Glucose, capillary     Status: Abnormal   Collection Time: 05/29/23  3:24 AM  Result Value Ref Range   Glucose-Capillary 154 (H) 70 - 99 mg/dL    Comment: Glucose reference range applies only to samples taken after fasting for at least 8 hours.  Basic metabolic panel     Status: Abnormal   Collection Time: 05/29/23  5:32 AM  Result Value Ref Range   Sodium 137 135 - 145 mmol/L   Potassium 3.6 3.5 - 5.1 mmol/L   Chloride 103 98 - 111 mmol/L   CO2 27 22 - 32 mmol/L   Glucose, Bld 172 (H) 70 - 99 mg/dL    Comment: Glucose reference range applies only to samples taken after fasting for at least 8 hours.   BUN 24 (H) 6 - 20 mg/dL   Creatinine, Ser 1.61 (H) 0.44 - 1.00 mg/dL   Calcium 7.7 (L) 8.9 - 10.3 mg/dL   GFR, Estimated 45 (L) >60 mL/min    Comment: (NOTE) Calculated using the CKD-EPI Creatinine Equation (2021)    Anion gap 7 5 - 15    Comment: Performed at [facility], [address], [place], [place] [address]  CBC     Status: Abnormal   Collection Time: 05/29/23  5:32 AM  Result Value Ref Range   WBC 17.5 (H) 4.0 - 10.5 K/uL   RBC 3.56 (L) 3.87 - 5.11 MIL/uL   Hemoglobin 10.3 (L) 12.0 - 15.0 g/dL   HCT 54.0 (L) 98.1 - 19.1 %   MCV 86.5 80.0 - 100.0 fL   MCH 28.9 26.0 - 34.0 pg   MCHC 33.4 30.0 - 36.0 g/dL   RDW 47.8 29.5 - 62.1 %   Platelets 317 150 - 400 K/uL   nRBC 0.0 0.0 - 0.2 %    Comment: Performed at [facility], [address], [place], [place] [address]  Magnesium     Status: None   Collection Time: 05/29/23  5:32 AM  Result Value Ref Range   Magnesium 2.4 1.7 - 2.4 mg/dL    Comment: Performed at [facility], [address] [place], [place] [address]  Phosphorus     Status: None   Collection Time: 05/29/23  5:32 AM  Result Value Ref  Range   Phosphorus 3.6 2.5 - 4.6 mg/dL    Comment: Performed at [facility], [address], [place], [place] [address]  Glucose, capillary     Status: Abnormal   Collection Time: 05/29/23  5:36 AM  Result Value Ref Range   Glucose-Capillary 150 (H) 70 - 99 mg/dL    Comment: Glucose reference range applies only to samples taken after fasting for at least 8 hours.  Glucose, capillary     Status: Abnormal   Collection Time: 05/29/23  7:19 AM  Result Value Ref Range   Glucose-Capillary 169 (H) 70 - 99 mg/dL    Comment: Glucose reference range applies only to samples taken after fasting for at least 8 hours.  Glucose, capillary     Status: Abnormal   Collection Time: 05/29/23 11:03 AM  Result Value Ref Range   Glucose-Capillary 145 (H) 70 - 99 mg/dL    Comment: Glucose reference range applies only to samples taken after fasting for at least 8 hours.  Urinalysis, Routine w reflex microscopic -Urine, Catheterized     Status: Abnormal   Collection Time: 05/29/23 12:50 PM  Result Value Ref Range   Color, Urine YELLOW YELLOW   APPearance CLEAR CLEAR   Specific Gravity, Urine >1.030 (H) 1.005 - 1.030   pH 5.5 5.0 - 8.0   Glucose, UA 250 (A) NEGATIVE mg/dL   Hgb urine dipstick MODERATE (A) NEGATIVE   Bilirubin Urine NEGATIVE NEGATIVE   Ketones, ur NEGATIVE NEGATIVE mg/dL   Protein, ur 30 (A) NEGATIVE mg/dL   Nitrite NEGATIVE NEGATIVE   Leukocytes,Ua NEGATIVE NEGATIVE    Comment: Performed at [facility], [address] [place], [place] [address]  Urinalysis, Microscopic (reflex)     Status: Abnormal   Collection Time: 05/29/23 12:50 PM  Result Value Ref Range   RBC / HPF 0-5 0 - 5 RBC/hpf   WBC, UA 0-5 0 - 5 WBC/hpf   Bacteria, UA FEW (A) NONE SEEN   Squamous Epithelial / HPF 0-5 0 - 5 /HPF   Mucus PRESENT     Comment: Performed at [facility], [address], [place], [place] [address]  Glucose, capillary     Status: Abnormal   Collection Time: 05/29/23  3:46 PM   Result Value Ref Range   Glucose-Capillary 163 (H) 70 - 99 mg/dL    Comment: Glucose reference range applies only to samples taken after fasting for at least 8 hours.  Glucose, capillary     Status: Abnormal   Collection Time: 05/29/23  6:01 PM  Result Value Ref Range   Glucose-Capillary 156 (H) 70 - 99 mg/dL    Comment: Glucose reference range applies only to samples taken after fasting for at least 8 hours.  Glucose, capillary     Status: Abnormal   Collection Time: 05/29/23  7:29 PM  Result Value Ref Range   Glucose-Capillary 180 (H) 70 - 99 mg/dL    Comment: Glucose reference range applies only to samples taken after fasting for at least 8 hours.  Glucose, capillary     Status: Abnormal   Collection Time: 05/29/23 11:16 PM  Result Value Ref Range   Glucose-Capillary 218 (H) 70 - 99 mg/dL    Comment: Glucose reference range applies only to samples taken after fasting for at least 8  hours.  CBC     Status: Abnormal   Collection Time: 05/30/23 12:01 AM  Result Value Ref Range   WBC 14.6 (H) 4.0 - 10.5 K/uL   RBC 3.51 (L) 3.87 - 5.11 MIL/uL   Hemoglobin 10.1 (L) 12.0 - 15.0 g/dL   HCT 16.1 (L) 09.6 - 04.5 %   MCV 85.8 80.0 - 100.0 fL   MCH 28.8 26.0 - 34.0 pg   MCHC 33.6 30.0 - 36.0 g/dL   RDW 40.9 81.1 - 91.4 %   Platelets 316 150 - 400 K/uL   nRBC 0.0 0.0 - 0.2 %    Comment: Performed at [facility], [address] [place], [place] [address]  Comprehensive metabolic panel     Status: Abnormal   Collection Time: 05/30/23 12:01 AM  Result Value Ref Range   Sodium 135 135 - 145 mmol/L   Potassium 3.7 3.5 - 5.1 mmol/L   Chloride 102 98 - 111 mmol/L   CO2 24 22 - 32 mmol/L   Glucose, Bld 226 (H) 70 - 99 mg/dL    Comment: Glucose reference range applies only to samples taken after fasting for at least 8 hours.   BUN 15 6 - 20 mg/dL   Creatinine, Ser 6.21 0.44 - 1.00 mg/dL   Calcium 8.1 (L) 8.9 - 10.3 mg/dL   Total Protein 5.5 (L) 6.5 - 8.1 g/dL   Albumin 2.5 (L) 3.5  - 5.0 g/dL   AST 27 15 - 41 U/L   ALT 21 0 - 44 U/L   Alkaline Phosphatase 53 38 - 126 U/L   Total Bilirubin 0.4 0.3 - 1.2 mg/dL   GFR, Estimated >30 >86 mL/min    Comment: (NOTE) Calculated using the CKD-EPI Creatinine Equation (2021)    Anion gap 9 5 - 15    Comment: Performed at [facility], [address], [place], [place] [address]  Triglycerides     Status: Abnormal   Collection Time: 05/30/23 12:01 AM  Result Value Ref Range   Triglycerides 170 (H) <150 mg/dL    Comment: Performed at [facility], [address], [place], [place] [address]  Magnesium     Status: None   Collection Time: 05/30/23 12:01 AM  Result Value Ref Range   Magnesium 2.2 1.7 - 2.4 mg/dL    Comment: Performed at [facility], [address], [place], [place] [address]  Phosphorus     Status: Abnormal   Collection Time: 05/30/23 12:01 AM  Result Value Ref Range   Phosphorus 1.6 (L) 2.5 - 4.6 mg/dL    Comment: Performed at [facility], [address] [place], [place] [address]  Glucose, capillary     Status: Abnormal   Collection Time: 05/30/23  3:15 AM  Result Value Ref Range   Glucose-Capillary 202 (H) 70 - 99 mg/dL    Comment: Glucose reference range applies only to samples taken after fasting for at least 8 hours.  Glucose, capillary     Status: Abnormal   Collection Time: 05/30/23  8:08 AM  Result Value Ref Range   Glucose-Capillary 183 (H) 70 - 99 mg/dL    Comment: Glucose reference range applies only to samples taken after fasting for at least 8 hours.  Glucose, capillary     Status: Abnormal   Collection Time: 05/30/23 11:37 AM  Result Value Ref Range   Glucose-Capillary 160 (H) 70 - 99 mg/dL    Comment: Glucose  reference range applies only to samples taken after fasting for at least 8 hours.   Date 05/30/23 0701 - 05/31/23 0700  Shift 0701-1900 1901-0700 24 Hour Total  INTAKE  I.V.(mL/kg) 367(4.4)  367(4.4)    Volume (mL) (0.9 %  sodium chloride infusion) 17.2   17.2    TPN Volume (TPN ADULT (ION)) 349.8  349.8  IV Piggyback 319  319    Volume (mL) (potassium PHOSPHATE 15 mmol in sodium chloride 0.9 % 250 mL infusion) 219  219    Volume (mL) (acetaminophen (OFIRMEV) IV 1,000 mg) 100  100  Shift Total(mL/kg) 685.9(8.2)  685.9(8.2)  OUTPUT  Urine(mL/kg/hr) 680  680    Urine 250  250    Output (mL) ([REMOVED] Urethral Catheter Annie Sable RN Latex;Straight-tip 14 Fr.) 430  430  Emesis/NG output 159  159    Output (mL) (NG/OG Vented/Dual Lumen 16 Fr. Left nare Marking at nare/corner of mouth 47 cm) 159  159  Drains 20  20    Output (mL) (Closed System Drain 1 LUQ Bulb (JP) 19 Fr.) 20  20  Shift Total(mL/kg) 859(10.3)  859(10.3)  NET -173.1  -173.1  Weight (kg) 83.2 83.2 83.2     ASSESSMENT/PLAN: POD#3 s/p Ex-lap, hernia reduction, Vicryl mesh underlay and fascial closure on 7/27.  -NPO, NG tube in place -GI management per gen surgery -pain management per team -DM/fluid management: per critical care team - Continue physical therapy  Will continue to follow appreciate recs from general surgery and critical care team  DVT Prophylaxis:  Lovenox Code Status: Full  Catalina Antigua, MD Attending Obstetrician & Gynecologist, [facility] for [employer], [facility]

## 2023-05-30 NOTE — Progress Notes (Signed)
Physical Therapy Treatment Patient Details Name: Maria [name] MRN: [id number] DOB: 1981-05-23 Today's Date: 05/30/2023   History of Present Illness 42 yo female admitted 7/27 with N/V, ileus. Fascial dehiscence of incision and small bowel herniation with obstruction requiring re-exploration, hernia reduction, vicryl mesh underlay and fascial closure. PMHx: Pt admission 7/19-7/25/24 with lt flank pain. 7/20 S/p ex-lap with evacuation of hemorrhagic ovarian cyst and serosal repair of colon. Migraine, CKD, DM    PT Comments  Pt pleasant with twin sister present throughout session. Pt able to stand, walk and be up to chair despite 9/10 abdominal pain with use of PCA during session. Pt reports throat soreness from NGT limiting desire to speak. Pt eager to return home to spend time with family and dancing/drinking. Pt encouraged to be OOB throughout the day and will continue to follow to maximize independence.     If plan is discharge home, recommend the following: A little help with walking and/or transfers;Assistance with cooking/housework;Assist for transportation;Help with stairs or ramp for entrance   Can travel by private vehicle        Equipment Recommendations  BSC/3in1    Recommendations for Other Services       Precautions / Restrictions Precautions Precautions: Other (comment) Precaution Comments: abdominal binder, jp drain, NGT     Mobility  Bed Mobility Overal bed mobility: Needs Assistance Bed Mobility: Supine to Sit Rolling: Min assist Sidelying to sit: Min assist, HOB elevated       General bed mobility comments: HOB 30 degrees with cues and assist for sequence and trunk elevation    Transfers Overall transfer level: Needs assistance   Transfers: Sit to/from Stand Sit to Stand: Min guard           General transfer comment: cues for hand placement and scooting back in chair with return to surface    Ambulation/Gait Ambulation/Gait assistance: Min  guard Gait Distance (Feet): 150 Feet Assistive device: Rolling walker (2 wheels) Gait Pattern/deviations: Step-through pattern, Decreased stride length, Trunk flexed   Gait velocity interpretation: <1.31 ft/sec, indicative of household ambulator   General Gait Details: cues for posture, proximity to RW and breathing technique. Very slow steady gait   Stairs             Wheelchair Mobility     Tilt Bed    Modified Rankin (Stroke Patients Only)       Balance Overall balance assessment: Mild deficits observed, not formally tested                                          Cognition Arousal/Alertness: Awake/alert Behavior During Therapy: WFL for tasks assessed/performed Overall Cognitive Status: Within Functional Limits for tasks assessed                                          Exercises      General Comments        Pertinent Vitals/Pain Pain Assessment Pain Score: 9  Pain Location: abdomen Pain Descriptors / Indicators: Grimacing, Guarding Pain Intervention(s): Limited activity within patient's tolerance, Monitored during session, PCA encouraged, Repositioned    Home Living                          Prior Function  PT Goals (current goals can now be found in the care plan section) Progress towards PT goals: Progressing toward goals    Frequency    Min 1X/week      PT Plan Current plan remains appropriate    Co-evaluation              AM-PAC PT "6 Clicks" Mobility   Outcome Measure  Help needed turning from your back to your side while in a flat bed without using bedrails?: A Little Help needed moving from lying on your back to sitting on the side of a flat bed without using bedrails?: A Little Help needed moving to and from a bed to a chair (including a wheelchair)?: A Little Help needed standing up from a chair using your arms (e.g., wheelchair or bedside chair)?: A Little Help  needed to walk in hospital room?: A Little Help needed climbing 3-5 steps with a railing? : A Little 6 Click Score: 18    End of Session Equipment Utilized During Treatment: Other (comment) (abd binder) Activity Tolerance: Patient tolerated treatment well Patient left: with call bell/phone within reach;with family/visitor present;in chair;with nursing/sitter in room Nurse Communication: Mobility status PT Visit Diagnosis: Other abnormalities of gait and mobility (R26.89)     Time: 1010-1043 PT Time Calculation (min) (ACUTE ONLY): 33 min  Charges:    $Gait Training: 8-22 mins $Therapeutic Activity: 8-22 mins PT General Charges $$ ACUTE PT VISIT: 1 Visit                     Merryl Hacker, PT Acute Rehabilitation Services Office: [phone]    Enedina Finner Javyon Fontan 05/30/2023, 11:14 AM

## 2023-05-30 NOTE — Progress Notes (Signed)
PHARMACY - TOTAL PARENTERAL NUTRITION CONSULT NOTE   Indication: Small bowel obstruction and possible ileus  Patient Measurements: Height: 4' 9.01" (144.8 cm) Weight: 86.8 kg (191 lb 5.8 oz) IBW/kg (Calculated) : 38.62 TPN AdjBW (KG): 47.1 Body mass index is 41.4 kg/m. Usual Weight: patient reports she is typically 193 lbs (87.7 kg)  Assessment: 41yo female who presented 7/27 following previous 7/20-25 where she underwent exlap with lysis of adhesions, serosa repair, evacuation of hemorrhagic ovarian cyst. Diet was advanced to normal over course of admission with emesis POD3-4 but overall tolerated with normal bowel sounds, BM and flatus prior to discharge 7/25. Pt began to experience abdominal distension and cramping 7/26 that progressed to vomiting 7/27 and has been unable to maintain PO intake. Underwent re-exlap 7/27, hernia reduction, Vicryl mesh underlay, fascial closure. Pharmacy is consulted to initiate TPN.   Glucose / Insulin: CBG 200s on TPN - 11u /24h SSI Electrolytes: K 3.7, Ca 8.1, Phos 1.6, otherwise WNL Renal: Scr 0.85, BUN 15 Hepatic: LFTs stable , TG 170  Intake / Output; MIVF: drain 45, UOP 0.9 ml/kg/hr, net +4.4L GI Imaging: GI Surgeries / Procedures:  Central access: PICC TPN start date: 7/29 PM  Nutritional Goals:  Goal rate TPN 90 ml/hr to provide 2190 kcal and 125 g protein (100% of estimated needs)  RD Assessment: Estimated Needs Total Energy Estimated Needs: 2200-2400 Total Protein Estimated Needs: 125-145 grams Total Fluid Estimated Needs: >2.2 L  Current Nutrition:  NPO  Plan:  Advance TPN modestly to 31ml/hr (66% rate to provide ~66% of estimated needs) - noted new recs from RD - appreciate assistance Electrolytes in TPN: Na 50 mEq/L, K 45 mEq/L, Ca 10 mEq/L, Mg 5 mEq/L, and Phos 25 mmol/L. Cl:Ac 1:1 Famotidine to TPN Increase to Moderate q4h SSI and adjust as needed  Monitor TPN labs on Mon/Thurs, daily until stable   Calton Dach,  PharmD Clinical Pharmacist 05/30/2023 9:35 AM

## 2023-05-31 ENCOUNTER — Ambulatory Visit: Payer: Managed Care, Other (non HMO)

## 2023-05-31 DIAGNOSIS — K567 Ileus, unspecified: Secondary | ICD-10-CM | POA: Diagnosis not present

## 2023-05-31 LAB — GLUCOSE, CAPILLARY
Glucose-Capillary: 156 mg/dL — ABNORMAL HIGH (ref 70–99)
Glucose-Capillary: 169 mg/dL — ABNORMAL HIGH (ref 70–99)
Glucose-Capillary: 170 mg/dL — ABNORMAL HIGH (ref 70–99)
Glucose-Capillary: 182 mg/dL — ABNORMAL HIGH (ref 70–99)
Glucose-Capillary: 185 mg/dL — ABNORMAL HIGH (ref 70–99)
Glucose-Capillary: 189 mg/dL — ABNORMAL HIGH (ref 70–99)
Glucose-Capillary: 194 mg/dL — ABNORMAL HIGH (ref 70–99)

## 2023-05-31 MED ORDER — METOPROLOL TARTRATE 5 MG/5ML IV SOLN
5.0000 mg | Freq: Four times a day (QID) | INTRAVENOUS | Status: DC
  Administered 2023-05-31 – 2023-06-04 (×16): 5 mg via INTRAVENOUS
  Filled 2023-05-31 (×16): qty 5

## 2023-05-31 MED ORDER — TRAVASOL 10 % IV SOLN
INTRAVENOUS | Status: AC
  Filled 2023-05-31: qty 1512

## 2023-05-31 MED ORDER — LABETALOL HCL 5 MG/ML IV SOLN: 10.0000 mg | INTRAVENOUS | Status: DC | PRN

## 2023-05-31 MED ORDER — ORAL CARE MOUTH RINSE: 15.0000 mL | OROMUCOSAL | Status: DC | PRN

## 2023-05-31 MED ORDER — TRAVASOL 10 % IV SOLN
INTRAVENOUS | Status: DC
  Filled 2023-05-31: qty 1231.2

## 2023-05-31 NOTE — Plan of Care (Signed)

## 2023-05-31 NOTE — Progress Notes (Signed)
Patient ID: FLORA HILLSTEAD, female   DOB: 02-24-1981, 42 y.o.   MRN: [id number] 4 Days Post-Op    Subjective: No flatus or BM, reports moving around better ROS negative except as listed above. Objective: Vital signs in last 24 hours: Temp:  [98.4 F (36.9 C)-99.7 F (37.6 C)] 99.5 F (37.5 C) (07/31 0758) Pulse Rate:  [103-121] 121 (07/31 0758) Resp:  [19-26] 20 (07/31 0758) BP: (114-165)/(77-102) 151/93 (07/31 0758) SpO2:  [90 %-98 %] 98 % (07/31 0758) FiO2 (%):  [21 %] 21 % (07/30 1102) Weight:  [83.2 kg] 83.2 kg (07/30 1400) Last BM Date : 05/19/23  Intake/Output from previous day: 07/30 0701 - 07/31 0700 In: 944.9 [I.V.:586.9; IV Piggyback:357.9] Out: 1497 [Urine:1140; Emesis/NG output:249; Drains:108] Intake/Output this shift: Total I/O In: 341 [I.V.:341] Out: -   Abd soft, incision some SS drainage, JP SS as well, no sig cellulitis, I changed her dressing  Lab Results: CBC  Recent Labs    05/30/23 0001 05/31/23 0423  WBC 14.6* 13.1*  HGB 10.1* 10.1*  HCT 30.1* 31.0*  PLT 316 390   BMET Recent Labs    05/30/23 0001 05/31/23 0423  NA 135 139  K 3.7 3.7  CL 102 102  CO2 24 26  GLUCOSE 226* 171*  BUN 15 14  CREATININE 0.85 0.44  CALCIUM 8.1* 8.7*   PT/INR No results for input(s): "LABPROT", "INR" in the last 72 hours. ABG No results for input(s): "PHART", "HCO3" in the last 72 hours.  Invalid input(s): "PCO2", "PO2"  Studies/Results: DG CHEST PORT 1 VIEW  Result Date: 05/29/2023 CLINICAL DATA:  Nausea, emesis, leukocytosis EXAM: PORTABLE CHEST 1 VIEW COMPARISON:  Chest radiograph 2 days prior FINDINGS: The enteric catheter tip is in the stomach. The side-hole is suspected to be at the level of the GE junction. The right upper extremity PICC tip is in the right atrium. The cardiomediastinal silhouette is stable, allowing for lower lung volumes Lung volumes are low with asymmetric elevation of the right hemidiaphragm. There is no focal airspace  consolidation. There is no pulmonary edema. There is no significant pleural effusion. There is no pneumothorax There is no acute osseous abnormality. IMPRESSION: 1. Enteric catheter side hole suspected to be at the level of the GE junction. Recommend advancement by approximately 5 cm. 2. Low lung volumes.  No focal consolidation or pleural effusion. Electronically Signed   By: Lesia Hausen M.D.   On: 05/29/2023 14:30    Anti-infectives: Anti-infectives (From admission, onward)    Start     Dose/Rate Route Frequency Ordered Stop   05/27/23 1545  ceFAZolin (ANCEF) IVPB 2g/100 mL premix       Placed in "And" Linked Group   2 g 200 mL/hr over 30 Minutes Intravenous On call to O.R. 05/27/23 1539 05/27/23 1655   05/27/23 1545  metroNIDAZOLE (FLAGYL) IVPB 500 mg       Placed in "And" Linked Group   500 mg 100 mL/hr over 60 Minutes Intravenous On call to O.R. 05/27/23 1539 05/27/23 1753   05/27/23 1545  metroNIDAZOLE (FLAGYL) 500 MG/100ML IVPB       Note to Pharmacy: Crissie Sickles: cabinet override      05/27/23 1545 05/27/23 1653   05/27/23 1545  ceFAZolin (ANCEF) 2-4 GM/100ML-% IVPB       Note to Pharmacy: Crissie Sickles: cabinet override      05/27/23 1545 05/27/23 1651       Assessment/Plan: S/p  ex-lap with extensive LOA,  evacuation of hemorrhagic ovarian cyst and serosal repair of colon 05/20/23  Fascial dehiscence of Pfannenstiel incision and small bowel herniation with obstruction  POD4 s/p re-exploration, hernia reduction, vicryl mesh underlay and fascial closure   - ileus post-op as expected - TPN, continue NGT on LIWS until return in bowel function  - binder and continue drain in SQ - mobilize as able - WBC trending down, monitor incision - may need to remove some staples if worsening drainage or cellulitis but not so far  FEN: NPO, IVF, TPN, NGT to LIWS VTE: SQH ID: no current abx, WBC 13K from 14K, afebrile  - per primary attending -  T2DM AKI resolved  LOS: 4 days     Violeta Gelinas, MD, MPH, FACS Trauma & General Surgery Use [email or web address] to contact on call provider  05/31/2023

## 2023-05-31 NOTE — Progress Notes (Addendum)
   05/31/23 2329  Vitals  Temp (!) 101.9 F (38.8 C)  Temp Source Oral  BP (!) 146/95  MAP (mmHg) 109  BP Location Left Arm  BP Method Automatic  Patient Position (if appropriate) Lying  Pulse Rate Source Monitor  ECG Heart Rate (!) 127  Resp 18  Level of Consciousness  Level of Consciousness Alert  MEWS COLOR  MEWS Score Color Red  Oxygen Therapy  SpO2 96 %  O2 Device Nasal Cannula  O2 Flow Rate (L/min) 2 L/min  Pain Assessment  Pain Scale 0-10  Pain Score 10  Pain Type Surgical pain  Pain Location Abdomen  Pain Orientation Lower  Pain Descriptors / Indicators Sore  Pain Frequency Constant  Pain Onset Gradual  Pain Intervention(s) Medication (See eMAR);Repositioned  MEWS Score  MEWS Temp 2  MEWS Systolic 0  MEWS Pulse 2  MEWS RR 0  MEWS LOC 0  MEWS Score 4   Text page Dr Loney Loh red mews score with v.s.  Stephanine Rn aware and will call Rapid Response

## 2023-05-31 NOTE — Progress Notes (Signed)
Patient ID: JURIDIA NAMBO, female   DOB: 09-10-1981, 42 y.o.   MRN: [id number]           [facility] OB/GYN Attending Progress Note  ADMISSION DIAGNOSIS:   Principal Problem:   Ileus (HCC) Active Problems:   Hypoglycemia   Intestinal obstruction (HCC)   Malnutrition of moderate degree   Subjective  Patient reports feeling stable. Nausea is improving. No emesis since Sunday evening. Her pain is still present and well controlled.  Denies passing flatus.  Denies fever/chills. She ambulates to chair with assistance.    Objective  VITALS:  height is 4' 9.01" (1.448 m) and weight is 83.2 kg. Her oral temperature is 98.4 F (36.9 C). Her blood pressure is 150/95 (abnormal) and her pulse is 109 (abnormal). Her respiration is 20 and oxygen saturation is 98%.   EXAMINATION: CONSTITUTIONAL: no acute distress SKIN: warm and dry, non-diaphoretic NEUROLOGIC: Alert and oriented, no focal deficits CARDIOVASCULAR: Regular rate and rhythm RESPIRATORY: Effort and breath sounds normal, no problems with respiration noted Abdomen: soft, mild distension.  Some BS noted. Appropriately tender.  Abdominal binder in place. Incision with dressing stained with serous fluid- overall clean and dry.  JP drain in place with serosanguinous fluid (about 20-30 cc over 8 hr) Ext: SCDs in place, no calf tenderness   Laboratory Reports:    Latest Ref Rng & Units 05/31/2023    4:23 AM 05/30/2023   12:01 AM 05/29/2023    5:32 AM  CBC  WBC 4.0 - 10.5 K/uL 13.1  14.6  17.5   Hemoglobin 12.0 - 15.0 g/dL 01.7  51.0  25.8   Hematocrit 36.0 - 46.0 % 31.0  30.1  30.8   Platelets 150 - 400 K/uL 390  316  317       Latest Ref Rng & Units 05/31/2023    4:23 AM 05/30/2023   12:01 AM 05/29/2023    5:32 AM  CMP  Glucose 70 - 99 mg/dL 527  782  423   BUN 6 - 20 mg/dL 14  15  24    Creatinine 0.44 - 1.00 mg/dL 5.36  1.44  3.15   Sodium 135 - 145 mmol/L 139  135  137   Potassium 3.5 - 5.1 mmol/L 3.7  3.7   3.6   Chloride 98 - 111 mmol/L 102  102  103   CO2 22 - 32 mmol/L 26  24  27    Calcium 8.9 - 10.3 mg/dL 8.7  8.1  7.7   Total Protein 6.5 - 8.1 g/dL  5.5    Total Bilirubin 0.3 - 1.2 mg/dL  0.4    Alkaline Phos 38 - 126 U/L  53    AST 15 - 41 U/L  27    ALT 0 - 44 U/L  21     Output by Drain (mL) 05/29/23 0701 - 05/29/23 1900 05/29/23 1901 - 05/30/23 0700 05/30/23 0701 - 05/30/23 1900 05/30/23 1901 - 05/31/23 0700 05/31/23 0701 - 05/31/23 0737  Closed System Drain 1 LUQ Bulb (JP) 19 Fr. 43 2 28 80    JP with bilious fluid in the tube but minimal in the canister.  ASSESSMENT/PLAN: POD#4 s/p Ex-lap, hernia reduction, Vicryl mesh underlay and fascial closure on 7/27.  - NPO, NG tube in place - GI management per gen surgery - Pain management per team - Continue physical therapy  Will continue to follow appreciate recs from general surgery and critical care team  DVT Prophylaxis:  Heparin/SCDs Code Status: Full  Milas Hock, MD Attending Obstetrician & Gynecologist, [facility] for [facility], [facility]

## 2023-05-31 NOTE — Progress Notes (Signed)
RD briefly visited patient to check TPN which is infusing at 67ml/hr (goal- 90 ml/hr) . Patient has NG tube to LWS with light yellow output.   Labs reviewed  Meds revewed  Patient reported no questions or concerns.   Leodis Rains, RDN, LDN  Clinical Nutrition

## 2023-05-31 NOTE — Progress Notes (Addendum)
PHARMACY - TOTAL PARENTERAL NUTRITION CONSULT NOTE   Indication: Small bowel obstruction and possible ileus  Patient Measurements: Height: 4' 9.01" (144.8 cm) Weight: 83.2 kg (183 lb 6.8 oz) IBW/kg (Calculated) : 38.62 TPN AdjBW (KG): 47.1 Body mass index is 39.68 kg/m. Usual Weight: patient reports she is typically 193 lbs (87.7 kg)  Assessment: 42yo female who presented 7/27 following previous 7/20-25 where she underwent exlap with lysis of adhesions, serosa repair, evacuation of hemorrhagic ovarian cyst. Diet was advanced to normal over course of admission with emesis POD3-4 but overall tolerated with normal bowel sounds, BM and flatus prior to discharge 7/25. Pt began to experience abdominal distension and cramping 7/26 that progressed to vomiting 7/27 and has been unable to maintain PO intake. Underwent re-exlap 7/27, hernia reduction, Vicryl mesh underlay, fascial closure. Pharmacy is consulted to initiate TPN.   Glucose / Insulin: CBGs <180 on TPN - 17u /24h SSI Electrolytes: K 3.7, Ca 8.7, 7/30 Phos 1.6, otherwise WNL Renal: Scr 0.44, BUN WNL Hepatic: 7/30 LFTs stable , TG 170  Intake / Output; MIVF: LUQ drain 108, UOP 0.6 ml/kg/hr, NG output: 249 mL, net +3.9L GI Imaging: None since initiation of TPN GI Surgeries / Procedures: None since initiation of TPN   Central access: PICC TPN start date: 7/29 PM  Nutritional Goals:  Goal rate TPN 105 ml/hr to provide 2356 kcal and 151 g protein (100% of estimated needs)  RD Assessment: Estimated Needs Total Energy Estimated Needs: 2200-2400 Total Protein Estimated Needs: 125-145 grams Total Fluid Estimated Needs: >2.2 L  Current Nutrition:  NPO  Plan:  Advance TPN to goal rate of 128ml/hr (to provide ~100% of estimated needs) Electrolytes in TPN: Na 50 mEq/L, K 45 mEq/L, Ca 10 mEq/L, Mg 5 mEq/L, and Phos 25 mmol/L. Cl:Ac 1:1 Add famotidine 20mg  to TPN Continue Moderate q4h SSI and adjust as needed  Monitor TPN labs daily  until stable, then every Mon/Thurs   Wilburn Cornelia, PharmD, BCPS Clinical Pharmacist 05/31/2023 12:08 PM   Please refer to [facility] for pharmacy phone number

## 2023-05-31 NOTE — Progress Notes (Signed)
PROGRESS NOTE    Maria [name]  RUE:[id number] DOB: 12-31-1980 DOA: 05/27/2023 PCP: Deatra James, MD   Brief Narrative:  42 yo F PMH DM on glyburide, depression, who is POD 7 (Op date 7/20) ex lap evac of L hemorrhagic ovarian cyst, and extensive intraabdominal LOA, sigmoid colon serosal tear repair. She remained in the hospital until dc 7/25. Prior to dc she had + BM and + flatus. On 7/27 admited to ICU with hypoglycemia, concern for ileus. S/p re-exploration, hernia reduction, Vicryl mesh underlay and fascial closure.   Assessment & Plan:   Principal Problem:   Ileus (HCC) Active Problems:   Hypoglycemia   Intestinal obstruction (HCC)   Malnutrition of moderate degree  DM2, hypoglycemia -most likely poor intake + vomiting + taking home sulfonylurea, initially on dextrose drip and now on TPN, slightly hyperglycemic.  Blood sugar managed by insulin via TPN.    Hernia with bowel obstruction, fascial dehiscence  -- s/p ex lap hernia reduction, fascia closure (7/27)  Recent ex lap evac of hemorrhagic ovarian cyst, extensive LOA and sigmoid colon serosal repait (7/20)  -post op per CCS including wound care -N.p.o., NG tube to intermittent suction -TPN for nutrition.  Still with some intermittent nausea and flatus.   Leukocytosis, low grade fever -reactive v infectious, favor reactive, has been afebrile for at least 24 hours now. Observe off antibiotics. -U/A negative, chest x ray negative   AKI -resolved   Low phos -replace in TPN   Anemia Stable.  Monitor daily.  Sinus tachycardia/hypertension: Blood pressure elevated, no previous history of hypertension.  Also has sinus tachycardia but she appears to be calm.  Could be due to pain.  Will start on Lopressor scheduled 5 mg IV every 6 hours.  DVT prophylaxis: heparin injection 5,000 Units Start: 05/30/23 1800   Code Status: Full Code  Family Communication:  None present at bedside.  Plan of care discussed with patient in  length and he/she verbalized understanding and agreed with it.  Status is: Inpatient Remains inpatient appropriate because: Postop ileus   Estimated body mass index is 39.68 kg/m as calculated from the following:   Height as of this encounter: 4' 9.01" (1.448 m).   Weight as of this encounter: 83.2 kg.    Nutritional Assessment: Body mass index is 39.68 kg/m.Marland Kitchen Seen by dietician.  I agree with the assessment and plan as outlined below: Nutrition Status: Nutrition Problem: Moderate Malnutrition Etiology: acute illness (bowel obstruction, nausea, vomiting) Signs/Symptoms: mild muscle depletion, energy intake < or equal to 50% for > or equal to 5 days Interventions: TPN  . Skin Assessment: I have examined the patient's skin and I agree with the wound assessment as performed by the wound care RN as outlined below:    Consultants:  Gynecology and general surgery  Procedures:  As above  Antimicrobials:  Anti-infectives (From admission, onward)    Start     Dose/Rate Route Frequency Ordered Stop   05/27/23 1545  ceFAZolin (ANCEF) IVPB 2g/100 mL premix       Placed in "And" Linked Group   2 g 200 mL/hr over 30 Minutes Intravenous On call to O.R. 05/27/23 1539 05/27/23 1655   05/27/23 1545  metroNIDAZOLE (FLAGYL) IVPB 500 mg       Placed in "And" Linked Group   500 mg 100 mL/hr over 60 Minutes Intravenous On call to O.R. 05/27/23 1539 05/27/23 1753   05/27/23 1545  metroNIDAZOLE (FLAGYL) 500 MG/100ML IVPB  Note to Pharmacy: Crissie Sickles: cabinet override      05/27/23 1545 05/27/23 1653   05/27/23 1545  ceFAZolin (ANCEF) 2-4 GM/100ML-% IVPB       Note to Pharmacy: Crissie Sickles: cabinet override      05/27/23 1545 05/27/23 1651         Subjective: Patient seen and examined.  Her abdominal pain is improving, 7 out of 10 today, was 10 out of 10 yesterday.  Has intermittent nausea but no vomiting.  Not passing flatus.  Objective: Vitals:   05/30/23 2042  05/30/23 2333 05/31/23 0419 05/31/23 0758  BP: (!) 151/89 (!) 153/102 (!) 150/95 (!) 151/93  Pulse: (!) 109   (!) 121  Resp: 20   20  Temp: 99.2 F (37.3 C) 98.4 F (36.9 C) 98.4 F (36.9 C) 99.5 F (37.5 C)  TempSrc: Oral Oral Oral Oral  SpO2: 98%   98%  Weight:      Height:        Intake/Output Summary (Last 24 hours) at 05/31/2023 1059 Last data filed at 05/31/2023 0944 Gross per 24 hour  Intake 1088.85 ml  Output 1067 ml  Net 21.85 ml   Filed Weights   05/28/23 0437 05/29/23 0800 05/30/23 1400  Weight: 83.6 kg 86.8 kg 83.2 kg    Examination:  General exam: Appears calm and comfortable  Respiratory system: Clear to auscultation. Respiratory effort normal. Cardiovascular system: S1 & S2 heard, RRR. No JVD, murmurs, rubs, gallops or clicks. No pedal edema. Gastrointestinal system: Abdomen is distended, firm and generalized tenderness. No organomegaly or masses felt. Normal bowel sounds heard. Central nervous system: Alert and oriented. No focal neurological deficits. Extremities: Symmetric 5 x 5 power. Skin: No rashes, lesions or ulcers Psychiatry: Judgement and insight appear normal. Mood & affect appropriate.    Data Reviewed: I have personally reviewed following labs and imaging studies  CBC: Recent Labs  Lab 05/27/23 2018 05/28/23 0010 05/29/23 0532 05/30/23 0001 05/31/23 0423  WBC 14.3* 15.1* 17.5* 14.6* 13.1*  HGB 12.4 12.3 10.3* 10.1* 10.1*  HCT 36.4 35.5* 30.8* 30.1* 31.0*  MCV 84.3 84.5 86.5 85.8 85.2  PLT 335 323 317 316 390   Basic Metabolic Panel: Recent Labs  Lab 05/27/23 1243 05/27/23 1758 05/27/23 2018 05/28/23 0010 05/29/23 0532 05/30/23 0001 05/31/23 0423  NA 139   < > 138 138 137 135 139  K 2.9*   < > 3.2* 3.6 3.6 3.7 3.7  CL 96*   < > 100 100 103 102 102  CO2 31  --  28 28 27 24 26   GLUCOSE 30*   < > 85 141* 172* 226* 171*  BUN 5*   < > 6 7 24* 15 14  CREATININE 0.77   < > 0.74 0.73 1.49* 0.85 0.44  CALCIUM 9.6  --  8.6* 8.2*  7.7* 8.1* 8.7*  MG 2.2  --   --  1.7 2.4 2.2  --   PHOS  --   --   --  5.2* 3.6 1.6*  --    < > = values in this interval not displayed.   GFR: Estimated Creatinine Clearance: 82.4 mL/min (by C-G formula based on SCr of 0.44 mg/dL). Liver Function Tests: Recent Labs  Lab 05/27/23 1243 05/30/23 0001  AST 36 27  ALT 46* 21  ALKPHOS 69 53  BILITOT 0.6 0.4  PROT 8.0 5.5*  ALBUMIN 4.0 2.5*   Recent Labs  Lab 05/27/23 1243  LIPASE 38  No results for input(s): "AMMONIA" in the last 168 hours. Coagulation Profile: No results for input(s): "INR", "PROTIME" in the last 168 hours. Cardiac Enzymes: No results for input(s): "CKTOTAL", "CKMB", "CKMBINDEX", "TROPONINI" in the last 168 hours. BNP (last 3 results) No results for input(s): "PROBNP" in the last 8760 hours. HbA1C: No results for input(s): "HGBA1C" in the last 72 hours. CBG: Recent Labs  Lab 05/30/23 1529 05/30/23 2040 05/30/23 2359 05/31/23 0420 05/31/23 0942  GLUCAP 170* 174* 185* 169* 194*   Lipid Profile: Recent Labs    05/30/23 0001  TRIG 170*   Thyroid Function Tests: No results for input(s): "TSH", "T4TOTAL", "FREET4", "T3FREE", "THYROIDAB" in the last 72 hours. Anemia Panel: No results for input(s): "VITAMINB12", "FOLATE", "FERRITIN", "TIBC", "IRON", "RETICCTPCT" in the last 72 hours. Sepsis Labs: No results for input(s): "PROCALCITON", "LATICACIDVEN" in the last 168 hours.  Recent Results (from the past 240 hour(s))  MRSA Next Gen by PCR, Nasal     Status: None   Collection Time: 05/27/23  8:06 PM   Specimen: Nasal Mucosa; Nasal Swab  Result Value Ref Range Status   MRSA by PCR Next Gen NOT DETECTED NOT DETECTED Final    Comment: (NOTE) The GeneXpert MRSA Assay (FDA approved for NASAL specimens only), is one component of a comprehensive MRSA colonization surveillance program. It is not intended to diagnose MRSA infection nor to guide or monitor treatment for MRSA infections. Test performance is  not FDA approved in patients less than 47 years old. Performed at [facility], [address], [place], [place] [address]      Radiology Studies: No results found.  Scheduled Meds:  Chlorhexidine Gluconate Cloth  6 each Topical Daily   heparin injection (subcutaneous)  5,000 Units Subcutaneous Q8H   insulin aspart  0-15 Units Subcutaneous Q4H   metoprolol tartrate  5 mg Intravenous Q6H   sodium chloride flush  10-40 mL Intracatheter Q12H   Continuous Infusions:  sodium chloride 10 mL/hr (05/30/23 1552)   TPN ADULT (ION) 60 mL/hr at 05/30/23 1750     LOS: 4 days   Hughie Closs, MD [facility]  05/31/2023, 10:59 AM   *Please note that this is a verbal dictation therefore any spelling or grammatical errors are due to the "Dragon Medical One" system interpretation.  Please page via Amion and do not message via secure chat for urgent patient care matters. Secure chat can be used for non urgent patient care matters.  How to contact the [facility] Attending or Consulting provider 7A - 7P or covering provider during after hours 7P -7A, for this patient?  Check the care team in [facility] and look for a) attending/consulting [facility] provider listed and b) the [facility] team listed. Page or secure chat 7A-7P. Log into [email or web address] and use [facility]'s universal password to access. If you do not have the password, please contact the hospital operator. Locate the [facility] provider you are looking for under [facility] and page to a number that you can be directly reached. If you still have difficulty reaching the provider, please page the [facility] (Director on Call) for the Hospitalists listed on amion for assistance.

## 2023-05-31 NOTE — Progress Notes (Signed)
Mobility Specialist Progress Note:   05/31/23 1143  Mobility  Activity Ambulated with assistance in hallway  Level of Assistance Contact guard assist, steadying assist  Assistive Device  (IV Pole)  Distance Ambulated (ft) 225 ft  Activity Response Tolerated well  Mobility Referral Yes  $Mobility charge 1 Mobility  Mobility Specialist Start Time (ACUTE ONLY) 1125  Mobility Specialist Stop Time (ACUTE ONLY) 1140  Mobility Specialist Time Calculation (min) (ACUTE ONLY) 15 min   During Mobility: 143 HR  Post Mobility: 123 HR   Pt received in chair agreeable to mobility. C/o abdominal discomfort during ambulation, otherwise asymptomatic throughout. Pt returned to chair with NT present in room.  Leory Plowman  Mobility Specialist Please contact via [employer] office at [phone]

## 2023-06-01 ENCOUNTER — Inpatient Hospital Stay (HOSPITAL_COMMUNITY): Payer: 59

## 2023-06-01 DIAGNOSIS — K567 Ileus, unspecified: Secondary | ICD-10-CM | POA: Diagnosis not present

## 2023-06-01 LAB — GLUCOSE, CAPILLARY
Glucose-Capillary: 189 mg/dL — ABNORMAL HIGH (ref 70–99)
Glucose-Capillary: 188 mg/dL — ABNORMAL HIGH (ref 70–99)
Glucose-Capillary: 162 mg/dL — ABNORMAL HIGH (ref 70–99)
Glucose-Capillary: 178 mg/dL — ABNORMAL HIGH (ref 70–99)
Glucose-Capillary: 168 mg/dL — ABNORMAL HIGH (ref 70–99)

## 2023-06-01 LAB — CBC
HCT: 30.6 % — ABNORMAL LOW (ref 36.0–46.0)
Hemoglobin: 10.1 g/dL — ABNORMAL LOW (ref 12.0–15.0)
MCH: 27.9 pg (ref 26.0–34.0)
MCHC: 33 g/dL (ref 30.0–36.0)
MCV: 84.5 fL (ref 80.0–100.0)
Platelets: 404 10*3/uL — ABNORMAL HIGH (ref 150–400)
RBC: 3.62 MIL/uL — ABNORMAL LOW (ref 3.87–5.11)
RDW: 14.6 % (ref 11.5–15.5)
WBC: 14.3 10*3/uL — ABNORMAL HIGH (ref 4.0–10.5)
nRBC: 0 % (ref 0.0–0.2)

## 2023-06-01 LAB — LACTIC ACID, PLASMA: Lactic Acid, Venous: 1.2 mmol/L (ref 0.5–1.9)

## 2023-06-01 LAB — PROCALCITONIN: Procalcitonin: 0.76 ng/mL

## 2023-06-01 MED ORDER — ACETAMINOPHEN 10 MG/ML IV SOLN
1000.0000 mg | Freq: Three times a day (TID) | INTRAVENOUS | Status: AC | PRN
  Administered 2023-06-01 – 2023-06-02 (×2): 1000 mg via INTRAVENOUS
  Filled 2023-06-01 (×2): qty 100

## 2023-06-01 MED ORDER — SODIUM CHLORIDE 0.9 % IV BOLUS
1000.0000 mL | Freq: Once | INTRAVENOUS | Status: AC
  Administered 2023-06-01: 1000 mL via INTRAVENOUS

## 2023-06-01 MED ORDER — ACETAMINOPHEN 650 MG RE SUPP: 650.0000 mg | Freq: Four times a day (QID) | RECTAL | Status: DC | PRN

## 2023-06-01 MED ORDER — IOHEXOL 9 MG/ML PO SOLN: 500.0000 mL | ORAL | Status: AC

## 2023-06-01 MED ORDER — IOHEXOL 350 MG/ML SOLN
75.0000 mL | Freq: Once | INTRAVENOUS | Status: AC | PRN
  Administered 2023-06-01: 75 mL via INTRAVENOUS

## 2023-06-01 MED ORDER — ACETAMINOPHEN 10 MG/ML IV SOLN
1000.0000 mg | Freq: Once | INTRAVENOUS | Status: AC
  Administered 2023-06-01: 1000 mg via INTRAVENOUS
  Filled 2023-06-01: qty 100

## 2023-06-01 MED ORDER — TRAVASOL 10 % IV SOLN
INTRAVENOUS | Status: AC
  Filled 2023-06-01: qty 1512

## 2023-06-01 MED ORDER — IOHEXOL 9 MG/ML PO SOLN
500.0000 mL | ORAL | Status: AC
  Administered 2023-06-01 (×2): 500 mL

## 2023-06-01 MED ORDER — ACETAMINOPHEN 10 MG/ML IV SOLN: 1000.0000 mg | Freq: Four times a day (QID) | INTRAVENOUS | Status: DC | PRN

## 2023-06-01 MED ORDER — PIPERACILLIN-TAZOBACTAM 3.375 G IVPB
3.3750 g | Freq: Three times a day (TID) | INTRAVENOUS | Status: DC
  Administered 2023-06-01 – 2023-06-08 (×21): 3.375 g via INTRAVENOUS
  Filled 2023-06-01 (×21): qty 50

## 2023-06-01 NOTE — Progress Notes (Signed)
PROGRESS NOTE    Maria [name]  QMV:[id number] DOB: 1981/07/01 DOA: 05/27/2023 PCP: Deatra James, MD   Brief Narrative:  42 yo F PMH DM on glyburide, depression, who is POD 7 (Op date 7/20) ex lap evac of L hemorrhagic ovarian cyst, and extensive intraabdominal LOA, sigmoid colon serosal tear repair. She remained in the hospital until dc 7/25. Prior to dc she had + BM and + flatus. On 7/27 admited to ICU with hypoglycemia, concern for ileus. S/p re-exploration, hernia reduction, Vicryl mesh underlay and fascial closure.   Assessment & Plan:   Principal Problem:   Ileus (HCC) Active Problems:   Hypoglycemia   Intestinal obstruction (HCC)   Malnutrition of moderate degree  DM2, hypoglycemia -most likely poor intake + vomiting + taking home sulfonylurea, initially on dextrose drip and now on TPN, slightly hyperglycemic.  Blood sugar managed by insulin via TPN.    Hernia with bowel obstruction, fascial dehiscence  -- s/p ex lap hernia reduction, fascia closure (7/27)  Recent ex lap evac of hemorrhagic ovarian cyst, extensive LOA and sigmoid colon serosal repait (7/20).  TPN for nutrition.  Fever overnight, CT abdomen pelvis ordered by general surgery today.  She is passing flatus and remains NPO.  Management purely per general surgery.   Leukocytosis, low grade fever -Noted to have low-grade intermittent fever with Tmax of 101.9 around midnight 05/31/2023.  No change in leukocytosis.  Wondering if this is secondary to wound infection at the surgical site.  Will defer to general surgery to evaluate that and start on antibiotics if needed.  Will check procalcitonin.  Looks like they are pursuing CT abdomen and pelvis.  Acute hypoxic respiratory failure: Patient dropped her oxygen saturation and required 2 L of oxygen, chest x-ray suggestive of atelectasis but no edema or consolidation.  This is likely due to pain prohibiting her from taking deep breaths.  Incentive spirometry is ordered.   Notified the nurse.  She is now on room air.   AKI -resolved   Low phos -replace in TPN   Anemia Stable.  Monitor daily.  Sinus tachycardia/hypertension: Both improving.  Continue IV scheduled Lopressor and as needed IV labetalol.  DVT prophylaxis: heparin injection 5,000 Units Start: 05/30/23 1800   Code Status: Full Code  Family Communication:  None present at bedside.  Plan of care discussed with patient in length and he/she verbalized understanding and agreed with it.  Status is: Inpatient Remains inpatient appropriate because: Postop ileus   Estimated body mass index is 39.59 kg/m as calculated from the following:   Height as of this encounter: 4' 9.01" (1.448 m).   Weight as of this encounter: 83 kg.    Nutritional Assessment: Body mass index is 39.59 kg/m.Marland Kitchen Seen by dietician.  I agree with the assessment and plan as outlined below: Nutrition Status: Nutrition Problem: Moderate Malnutrition Etiology: acute illness (bowel obstruction, nausea, vomiting) Signs/Symptoms: mild muscle depletion, energy intake < or equal to 50% for > or equal to 5 days Interventions: TPN  . Skin Assessment: I have examined the patient's skin and I agree with the wound assessment as performed by the wound care RN as outlined below:    Consultants:  Gynecology and general surgery  Procedures:  As above  Antimicrobials:  Anti-infectives (From admission, onward)    Start     Dose/Rate Route Frequency Ordered Stop   05/27/23 1545  ceFAZolin (ANCEF) IVPB 2g/100 mL premix       Placed in "And" Linked Group  2 g 200 mL/hr over 30 Minutes Intravenous On call to O.R. 05/27/23 1539 05/27/23 1655   05/27/23 1545  metroNIDAZOLE (FLAGYL) IVPB 500 mg       Placed in "And" Linked Group   500 mg 100 mL/hr over 60 Minutes Intravenous On call to O.R. 05/27/23 1539 05/27/23 1753   05/27/23 1545  metroNIDAZOLE (FLAGYL) 500 MG/100ML IVPB       Note to Pharmacy: Crissie Sickles: cabinet override       05/27/23 1545 05/27/23 1653   05/27/23 1545  ceFAZolin (ANCEF) 2-4 GM/100ML-% IVPB       Note to Pharmacy: Crissie Sickles: cabinet override      05/27/23 1545 05/27/23 1651         Subjective: Patient seen and examined as she says that she is not feeling well due to worsening abdominal pain which is 10 out of 10 today.  Some nausea but she is passing flatus.  Objective: Vitals:   06/01/23 0355 06/01/23 0443 06/01/23 0610 06/01/23 0700  BP: 128/81  (!) 126/94 (!) 131/95  Pulse: (!) 117   87  Resp: 20  16 17   Temp: 99.4 F (37.4 C)  99.2 F (37.3 C) 98.8 F (37.1 C)  TempSrc: Oral  Oral Oral  SpO2: 97%  97% 97%  Weight:  83 kg    Height:        Intake/Output Summary (Last 24 hours) at 06/01/2023 0758 Last data filed at 06/01/2023 0612 Gross per 24 hour  Intake 340.98 ml  Output 1600 ml  Net -1259.02 ml   Filed Weights   05/29/23 0800 05/30/23 1400 06/01/23 0443  Weight: 86.8 kg 83.2 kg 83 kg    Examination:  General exam: Appears in pain. Respiratory system: Clear to auscultation. Respiratory effort normal. Cardiovascular system: S1 & S2 heard, RRR. No JVD, murmurs, rubs, gallops or clicks. No pedal edema. Gastrointestinal system: Abdomen is moderately distended, firm with generalized tenderness, has abdominal binder. No organomegaly or masses felt. Normal bowel sounds heard. Central nervous system: Alert and oriented. No focal neurological deficits. Extremities: Symmetric 5 x 5 power. Skin: No rashes, lesions or ulcers.  Psychiatry: Judgement and insight appear normal. Mood & affect appropriate.    Data Reviewed: I have personally reviewed following labs and imaging studies  CBC: Recent Labs  Lab 05/28/23 0010 05/29/23 0532 05/30/23 0001 05/31/23 0423 06/01/23 0203  WBC 15.1* 17.5* 14.6* 13.1* 14.3*  HGB 12.3 10.3* 10.1* 10.1* 10.1*  HCT 35.5* 30.8* 30.1* 31.0* 30.6*  MCV 84.5 86.5 85.8 85.2 84.5  PLT 323 317 316 390 404*   Basic Metabolic  Panel: Recent Labs  Lab 05/27/23 1243 05/27/23 1758 05/28/23 0010 05/29/23 0532 05/30/23 0001 05/31/23 0423 06/01/23 0203  NA 139   < > 138 137 135 139 140  K 2.9*   < > 3.6 3.6 3.7 3.7 3.8  CL 96*   < > 100 103 102 102 102  CO2 31   < > 28 27 24 26 24   GLUCOSE 30*   < > 141* 172* 226* 171* 164*  BUN 5*   < > 7 24* 15 14 16   CREATININE 0.77   < > 0.73 1.49* 0.85 0.44 0.72  CALCIUM 9.6   < > 8.2* 7.7* 8.1* 8.7* 8.9  MG 2.2  --  1.7 2.4 2.2  --  2.1  PHOS  --   --  5.2* 3.6 1.6*  --  3.3   < > = values  in this interval not displayed.   GFR: Estimated Creatinine Clearance: 82.4 mL/min (by C-G formula based on SCr of 0.72 mg/dL). Liver Function Tests: Recent Labs  Lab 05/27/23 1243 05/30/23 0001 06/01/23 0203  AST 36 27 17  ALT 46* 21 18  ALKPHOS 69 53 62  BILITOT 0.6 0.4 0.4  PROT 8.0 5.5* 6.1*  ALBUMIN 4.0 2.5* 2.4*   Recent Labs  Lab 05/27/23 1243  LIPASE 38   No results for input(s): "AMMONIA" in the last 168 hours. Coagulation Profile: No results for input(s): "INR", "PROTIME" in the last 168 hours. Cardiac Enzymes: No results for input(s): "CKTOTAL", "CKMB", "CKMBINDEX", "TROPONINI" in the last 168 hours. BNP (last 3 results) No results for input(s): "PROBNP" in the last 8760 hours. HbA1C: No results for input(s): "HGBA1C" in the last 72 hours. CBG: Recent Labs  Lab 05/31/23 1647 05/31/23 2050 05/31/23 2350 06/01/23 0351 06/01/23 0713  GLUCAP 170* 182* 189* 189* 188*   Lipid Profile: Recent Labs    05/30/23 0001 06/01/23 0203  TRIG 170* 331*   Thyroid Function Tests: No results for input(s): "TSH", "T4TOTAL", "FREET4", "T3FREE", "THYROIDAB" in the last 72 hours. Anemia Panel: No results for input(s): "VITAMINB12", "FOLATE", "FERRITIN", "TIBC", "IRON", "RETICCTPCT" in the last 72 hours. Sepsis Labs: Recent Labs  Lab 06/01/23 0203  LATICACIDVEN 1.2    Recent Results (from the past 240 hour(s))  MRSA Next Gen by PCR, Nasal     Status:  None   Collection Time: 05/27/23  8:06 PM   Specimen: Nasal Mucosa; Nasal Swab  Result Value Ref Range Status   MRSA by PCR Next Gen NOT DETECTED NOT DETECTED Final    Comment: (NOTE) The GeneXpert MRSA Assay (FDA approved for NASAL specimens only), is one component of a comprehensive MRSA colonization surveillance program. It is not intended to diagnose MRSA infection nor to guide or monitor treatment for MRSA infections. Test performance is not FDA approved in patients less than 25 years old. Performed at [facility], [address], [place], [place] [address]      Radiology Studies: No results found.  Scheduled Meds:  Chlorhexidine Gluconate Cloth  6 each Topical Daily   heparin injection (subcutaneous)  5,000 Units Subcutaneous Q8H   insulin aspart  0-15 Units Subcutaneous Q4H   metoprolol tartrate  5 mg Intravenous Q6H   sodium chloride flush  10-40 mL Intracatheter Q12H   Continuous Infusions:  sodium chloride 10 mL/hr (05/30/23 1552)   TPN ADULT (ION) 105 mL/hr at 05/31/23 1829     LOS: 5 days   Hughie Closs, MD [facility]  06/01/2023, 7:58 AM   *Please note that this is a verbal dictation therefore any spelling or grammatical errors are due to the "Dragon Medical One" system interpretation.  Please page via Amion and do not message via secure chat for urgent patient care matters. Secure chat can be used for non urgent patient care matters.  How to contact the [facility] Attending or Consulting provider 7A - 7P or covering provider during after hours 7P -7A, for this patient?  Check the care team in [facility] and look for a) attending/consulting TRH provider listed and b) the [facility] team listed. Page or secure chat 7A-7P. Log into [email or web address] and use [place]'s universal password to access. If you do not have the password, please contact the hospital operator. Locate the [facility] provider you are looking for under [facility] and page to a number that you can  be directly reached.  If you still have difficulty reaching the provider, please page the [facility] (Director on Call) for the Hospitalists listed on amion for assistance.

## 2023-06-01 NOTE — Progress Notes (Signed)
           [facility] Faculty Practice OB/GYN Attending Progress Note  ADMISSION DIAGNOSIS:   Principal Problem:   Ileus (HCC) Active Problems:   Hypoglycemia   Intestinal obstruction (HCC)   Malnutrition of moderate degree   Subjective  Pt just received IV pain medication and was fairly drowsy for our visit this am.  She notes her pain is well controlled.  Some nausea, no vomiting.  States she is passing flatus.  No BM.  Up to void with assistance.  Fever overnight noted.  Pt denies chills.   Objective  VITALS:  height is 4' 9.01" (1.448 m) and weight is 83 kg. Her oral temperature is 98.8 F (37.1 C). Her blood pressure is 131/95 (abnormal) and her pulse is 87. Her respiration is 17 and oxygen saturation is 97%.   EXAMINATION: CONSTITUTIONAL: Well-developed, well-nourished female in no acute distress.  EYES:  No scleral icterus.  SKIN: Skin is warm and dry. No rash noted. Not diaphoretic. NEUROLOGIC: Alert and oriented to person, place, and time. PSYCHIATRIC: Normal mood and affect. Normal behavior. CARDIOVASCULAR: Normal heart rate noted RESPIRATORY: Effort and breath sounds normal, no problems with respiration noted ABDOMEN: Soft, mild distension, some BS noted.  Bandage recently changed- appears clean and dry. JP drain in place serosanguinous fluid. Ext: No calf tenderness bilaterally, 1+ edema     ASSESSMENT/PLAN: POD#5 s/p re-exploration, hernia reduction, vicryl mesh underlay and fascial closure Initial surgery 7/20- s/p  ex-lap with extensive LOA, evacuation of hemorrhagic ovarian cyst and serosal repair of colon 05/20/23  Fascial dehiscence of Pfannenstiel incision and small bowel herniation with obstruction   1)Fever -Differential includes intraabdominal infection vs atlectasis- imaging ordered CT, CXR per gen surgery -Leukocytosis noted- slightly elevated from 7/30 -await results of imaging for next step  2) Postop ileus -NG in place -pt still NPO -TPN  ordered  3) FEN, DM2 -management per IM -on sliding scale for insulin  4) DVT Prophylaxis:  heparin   DISPO: Will continue to follow, appreciate management per IM and general surgery  Myna Hidalgo, DO Attending Obstetrician & Gynecologist, Faculty Practice Center for [employer], [facility]

## 2023-06-01 NOTE — Progress Notes (Signed)
Physical Therapy Treatment Patient Details Name: Maria [name] MRN: [id number] DOB: 1981-05-14 Today's Date: 06/01/2023   History of Present Illness 42 yo female admitted 7/27 with N/V, ileus. Fascial dehiscence of incision and small bowel herniation with obstruction requiring re-exploration, hernia reduction, vicryl mesh underlay and fascial closure. PMHx: Pt admission 7/19-7/25/24 with lt flank pain. 7/20 S/p ex-lap with evacuation of hemorrhagic ovarian cyst and serosal repair of colon. Migraine, CKD, DM    PT Comments  Pt received in supine and agreeable to session. Pt making good progress towards functional mobility goals this session. Pt able to perform all mobility tasks with up to min guard for safety. Pt able to tolerate increased gait distance with IV pole support with little unsteadiness. Pt is able to perform mobility with increased time, however activity tolerance continues to be limited by abdominal pain. Pt continues to benefit from PT services to progress toward functional mobility goals.    If plan is discharge home, recommend the following: A little help with walking and/or transfers;Assistance with cooking/housework;Assist for transportation;Help with stairs or ramp for entrance   Can travel by private vehicle        Equipment Recommendations  BSC/3in1    Recommendations for Other Services       Precautions / Restrictions Precautions Precautions: Other (comment) Precaution Comments: abdominal binder, jp drain, NGT Restrictions Weight Bearing Restrictions: No     Mobility  Bed Mobility Overal bed mobility: Needs Assistance Bed Mobility: Supine to Sit, Sit to Supine     Supine to sit: Supervision, HOB elevated Sit to supine: Min guard, HOB elevated   General bed mobility comments: Increased time and use of bedrails    Transfers Overall transfer level: Needs assistance Equipment used: None Transfers: Sit to/from Stand Sit to Stand: Min guard            General transfer comment: Cues for hand placement and min guard for safety    Ambulation/Gait Ambulation/Gait assistance: Min guard Gait Distance (Feet): 300 Feet Assistive device: IV Pole Gait Pattern/deviations: Step-through pattern, Decreased stride length, Trunk flexed       General Gait Details: Pt demonstrating a slow, steady step-through pattern with trunk flexion for abdomen bracing and no overt LOB or unsteadiness with IV pole support.      Balance Overall balance assessment: Mild deficits observed, not formally tested                                          Cognition Arousal/Alertness: Awake/alert Behavior During Therapy: WFL for tasks assessed/performed Overall Cognitive Status: Within Functional Limits for tasks assessed                                          Exercises      General Comments General comments (skin integrity, edema, etc.): VSS on RA      Pertinent Vitals/Pain Pain Assessment Pain Assessment: 0-10 Pain Score: 10-Worst pain ever Pain Location: abdomen Pain Descriptors / Indicators: Grimacing, Guarding Pain Intervention(s): Premedicated before session, Monitored during session, Repositioned     PT Goals (current goals can now be found in the care plan section) Acute Rehab PT Goals Patient Stated Goal: Go to her sisters house on discharge PT Goal Formulation: With patient Time For Goal Achievement: 06/12/23 Potential to  Achieve Goals: Good Progress towards PT goals: Progressing toward goals    Frequency    Min 1X/week      PT Plan Current plan remains appropriate       AM-PAC PT "6 Clicks" Mobility   Outcome Measure  Help needed turning from your back to your side while in a flat bed without using bedrails?: A Little Help needed moving from lying on your back to sitting on the side of a flat bed without using bedrails?: A Little Help needed moving to and from a bed to a chair  (including a wheelchair)?: A Little Help needed standing up from a chair using your arms (e.g., wheelchair or bedside chair)?: A Little Help needed to walk in hospital room?: A Little Help needed climbing 3-5 steps with a railing? : A Little 6 Click Score: 18    End of Session Equipment Utilized During Treatment:  (abd binder) Activity Tolerance: Patient tolerated treatment well Patient left: with call bell/phone within reach;in bed Nurse Communication: Mobility status PT Visit Diagnosis: Other abnormalities of gait and mobility (R26.89)     Time: 8119-1478 PT Time Calculation (min) (ACUTE ONLY): 17 min  Charges:    $Gait Training: 8-22 mins PT General Charges $$ ACUTE PT VISIT: 1 Visit                     Johny Shock, PTA Acute Rehabilitation Services Secure Chat Preferred  [phone]    Johny Shock 06/01/2023, 11:53 AM

## 2023-06-01 NOTE — Progress Notes (Signed)
Mobility Specialist Progress Note:   06/01/23 1500  Mobility  Activity Ambulated with assistance in hallway  Level of Assistance Standby assist, set-up cues, supervision of patient - no hands on  Assistive Device Other (Comment) (IV Pole)  Distance Ambulated (ft) 700 ft  Activity Response Tolerated well  Mobility Referral Yes  $Mobility charge 1 Mobility  Mobility Specialist Start Time (ACUTE ONLY) 1531  Mobility Specialist Stop Time (ACUTE ONLY) 1557  Mobility Specialist Time Calculation (min) (ACUTE ONLY) 26 min    Pre Mobility: 127 HR Post Mobility:  137 HR  Pt received on EOB, agreeable to mobility. Assisted to BR before hallway ambulation. Void successful. Asymptomatic throughout. Pt left on EOB with call bell and all needs met.  [name] Mobility Specialist Please contact via Special educational needs teacher or Rehab office at [phone]

## 2023-06-01 NOTE — Plan of Care (Signed)

## 2023-06-01 NOTE — Progress Notes (Signed)
PHARMACY - TOTAL PARENTERAL NUTRITION CONSULT NOTE   Indication: Small bowel obstruction and possible ileus  Patient Measurements: Height: 4' 9.01" (144.8 cm) Weight: 83 kg (182 lb 15.7 oz) IBW/kg (Calculated) : 38.62 TPN AdjBW (KG): 47.1 Body mass index is 39.59 kg/m. Usual Weight: patient reports she is typically 193 lbs (87.7 kg)  Assessment: 42yo female who presented 7/27 following previous 7/20-25 where she underwent exlap with lysis of adhesions, serosa repair, evacuation of hemorrhagic ovarian cyst. Diet was advanced to normal over course of admission with emesis POD3-4 but overall tolerated with normal bowel sounds, BM and flatus prior to discharge 7/25. Pt began to experience abdominal distension and cramping 7/26 that progressed to vomiting 7/27 and has been unable to maintain PO intake. Underwent re-exlap 7/27, hernia reduction, Vicryl mesh underlay, fascial closure. Pharmacy is consulted to initiate TPN.   Glucose / Insulin: CBGs <180 on TPN , 18 units/24h SSI Electrolytes: K 3.8, CoCa ~10, Phos up at 3.3, Mg 2.1, all others WNL Renal: Scr 0.72, BUN WNL Hepatic: LFTs stable , TG up at 331  Intake / Output; MIVF: LUQ drain 108, UOP 0.6 ml/kg/hr, NG output: 249 mL, net +3.9L  GI Imaging: None since initiation of TPN GI Surgeries / Procedures: None since initiation of TPN   Central access: PICC TPN start date: 7/29 PM  Nutritional Goals:  Goal rate TPN 105 ml/hr to provide 2356 kcal and 151 g protein (100% of estimated needs)  RD Assessment: Estimated Needs Total Energy Estimated Needs: 2200-2400 Total Protein Estimated Needs: 125-145 grams Total Fluid Estimated Needs: >2.2 L  Current Nutrition:  NPO  Plan:  Continue TPN at goal rate of 139ml/hr (to provide ~100% of estimated needs) Electrolytes in TPN: Na 50 mEq/L, K 45 mEq/L, Ca 10 mEq/L, Mg 5 mEq/L, and Phos 25 mmol/L. Cl:Ac 1:1 Add famotidine 20mg  to TPN Add insulin 10 units regular to TPN, continue Moderate  q4h SSI and adjust as needed Monitor TPN labs daily until stable, then every Mon/Thurs  Thank you for allowing pharmacy to be a part of this patient's care.  Thelma Barge, PharmD Clinical Pharmacist

## 2023-06-01 NOTE — Progress Notes (Signed)
   06/01/23 1611  Assess: MEWS Score  Temp 98.8 F (37.1 C)  BP (!) 129/90  MAP (mmHg) 101  Pulse Rate (!) 123  ECG Heart Rate (!) 124  Resp (!) 22  Level of Consciousness Alert  SpO2 94 %  O2 Device Room Air  Assess: MEWS Score  MEWS Temp 0  MEWS Systolic 0  MEWS Pulse 2  MEWS RR 1  MEWS LOC 0  MEWS Score 3  MEWS Score Color Yellow  Assess: if the MEWS score is Yellow or Red  Were vital signs accurate and taken at a resting state? Yes  Does the patient meet 2 or more of the SIRS criteria? Yes  Does the patient have a confirmed or suspected source of infection? No  MEWS guidelines implemented  Yes, yellow  Treat  MEWS Interventions Considered administering scheduled or prn medications/treatments as ordered  Take Vital Signs  Increase Vital Sign Frequency  Yellow: Q2hr x1, continue Q4hrs until patient remains green for 12hrs  Escalate  MEWS: Escalate Yellow: Discuss with charge nurse and consider notifying provider and/or RRT  Assess: SIRS CRITERIA  SIRS Temperature  0  SIRS Pulse 1  SIRS Respirations  1  SIRS WBC 0  SIRS Score Sum  2

## 2023-06-01 NOTE — Progress Notes (Signed)
   06/01/23 0029  Vitals  Temp (!) 100.7 F (38.2 C)  Temp Source Oral  BP 134/88  MAP (mmHg) 99  BP Location Left Arm  BP Method Automatic  Patient Position (if appropriate) Lying  Pulse Rate Source Monitor  ECG Heart Rate (!) 121  Resp 20  Level of Consciousness  Level of Consciousness Alert  MEWS COLOR  MEWS Score Color Yellow  Oxygen Therapy  SpO2 96 %  O2 Device Nasal Cannula  O2 Flow Rate (L/min) 2 L/min  Patient Activity (if Appropriate) In bed  Pain Assessment  Pain Scale 0-10  Pain Score Asleep  MEWS Score  MEWS Temp 1  MEWS Systolic 0  MEWS Pulse 2  MEWS RR 0  MEWS LOC 0  MEWS Score 3   Text Dr Alma Downs about earlier red mews and now v.s. is yellow

## 2023-06-01 NOTE — Progress Notes (Signed)
Progress Note  5 Days Post-Op  Subjective: Pt reports increased pain overnight, unable to describe how it is different. Increased drainage from wound overnight and she reports this is malodorous. Passing some flatus. Coughing more and O2 sats dropped to 88% yesterday with trying to wean O2 per RN. Febrile overnight.   Objective: Vital signs in last 24 hours: Temp:  [98.5 F (36.9 C)-101.9 F (38.8 C)] 98.8 F (37.1 C) (08/01 0700) Pulse Rate:  [87-127] 87 (08/01 0700) Resp:  [16-21] 17 (08/01 0700) BP: (118-149)/(74-95) 131/95 (08/01 0700) SpO2:  [96 %-98 %] 97 % (08/01 0700) Weight:  [83 kg] 83 kg (08/01 0443) Last BM Date : 06/19/23  Intake/Output from previous day: 07/31 0701 - 08/01 0700 In: 341 [I.V.:341] Out: 1600 [Urine:200; Emesis/NG output:1340; Drains:60] Intake/Output this shift: No intake/output data recorded.  PE: General: pleasant, WD, WN female who is laying in bed in NAD Heart: sinus tachy Lungs: Respiratory effort nonlabored Abd: appropriately ttp, incision probed with some SS drainage and staples present, mild erythema, abdominal binder present, drain SS, NGT with bilious output Psych: A&Ox3 with an appropriate affect.    Lab Results:  Recent Labs    05/31/23 0423 06/01/23 0203  WBC 13.1* 14.3*  HGB 10.1* 10.1*  HCT 31.0* 30.6*  PLT 390 404*   BMET Recent Labs    05/31/23 0423 06/01/23 0203  NA 139 140  K 3.7 3.8  CL 102 102  CO2 26 24  GLUCOSE 171* 164*  BUN 14 16  CREATININE 0.44 0.72  CALCIUM 8.7* 8.9   PT/INR No results for input(s): "LABPROT", "INR" in the last 72 hours. CMP     Component Value Date/Time   NA 140 06/01/2023 0203   K 3.8 06/01/2023 0203   CL 102 06/01/2023 0203   CO2 24 06/01/2023 0203   GLUCOSE 164 (H) 06/01/2023 0203   BUN 16 06/01/2023 0203   CREATININE 0.72 06/01/2023 0203   CALCIUM 8.9 06/01/2023 0203   PROT 6.1 (L) 06/01/2023 0203   ALBUMIN 2.4 (L) 06/01/2023 0203   AST 17 06/01/2023 0203   ALT  18 06/01/2023 0203   ALKPHOS 62 06/01/2023 0203   BILITOT 0.4 06/01/2023 0203   GFRNONAA >60 06/01/2023 0203   GFRAA >60 05/31/2018 1753   Lipase     Component Value Date/Time   LIPASE 38 05/27/2023 1243       Studies/Results: DG CHEST PORT 1 VIEW  Result Date: 06/01/2023 CLINICAL DATA:  Fever, shortness of breath EXAM: PORTABLE CHEST 1 VIEW COMPARISON:  None Available. FINDINGS: Transverse diameter of heart is increased. There are no signs of alveolar pulmonary edema. Low lung volumes. Linear densities are seen in lower lung fields. There is no focal consolidation. There is no pleural effusion or pneumothorax. Tip of right PICC line is seen at the junction of superior vena cava and right atrium. Tip of NG tube is seen in the stomach. Side port in the NG tube is noted at the gastroesophageal junction. IMPRESSION: Linear densities are seen in the lower lung fields suggesting subsegmental atelectasis. There are no signs of pulmonary edema or focal pulmonary consolidation. Side-port in NG tube is noted at the gastroesophageal junction. NG tube could be advanced 5-10 cm to place the side port within the stomach. Electronically Signed   By: Ernie Avena M.D.   On: 06/01/2023 09:25    Anti-infectives: Anti-infectives (From admission, onward)    Start     Dose/Rate Route Frequency Ordered Stop  05/27/23 1545  ceFAZolin (ANCEF) IVPB 2g/100 mL premix       Placed in "And" Linked Group   2 g 200 mL/hr over 30 Minutes Intravenous On call to O.R. 05/27/23 1539 05/27/23 1655   05/27/23 1545  metroNIDAZOLE (FLAGYL) IVPB 500 mg       Placed in "And" Linked Group   500 mg 100 mL/hr over 60 Minutes Intravenous On call to O.R. 05/27/23 1539 05/27/23 1753   05/27/23 1545  metroNIDAZOLE (FLAGYL) 500 MG/100ML IVPB       Note to Pharmacy: Crissie Sickles: cabinet override      05/27/23 1545 05/27/23 1653   05/27/23 1545  ceFAZolin (ANCEF) 2-4 GM/100ML-% IVPB       Note to Pharmacy: Crissie Sickles: cabinet override      05/27/23 1545 05/27/23 1651        Assessment/Plan S/p  ex-lap with extensive LOA, evacuation of hemorrhagic ovarian cyst and serosal repair of colon 05/20/23  Fascial dehiscence of Pfannenstiel incision and small bowel herniation with obstruction  POD5 s/p re-exploration, hernia reduction, vicryl mesh underlay and fascial closure   - ileus post-op as expected although starting to pass some gas - TPN, NGT - binder and continue drain in SQ, monitoring incision closely  - mobilize as able - WBC 14 and febrile overnight, CXR with some atelectasis but no consolidation - check CT CAP today with PO/IV contrast   FEN: NPO, IVF, TPN, NGT to LIWS VTE: SQH ID: no current abx, WBC 14K, febrile  - per primary attending -  T2DM AKI on CKD  LOS: 5 days     Juliet Rude, [facility] Surgery 06/01/2023, 10:26 AM Please see Amion for pager number during day hours 7:00am-4:30pm

## 2023-06-01 NOTE — Progress Notes (Signed)
Dr. Alma Downs return call and see new orders.

## 2023-06-02 ENCOUNTER — Encounter (HOSPITAL_COMMUNITY): Payer: Self-pay | Admitting: Pulmonary Disease

## 2023-06-02 ENCOUNTER — Inpatient Hospital Stay (HOSPITAL_COMMUNITY): Payer: 59

## 2023-06-02 DIAGNOSIS — K567 Ileus, unspecified: Secondary | ICD-10-CM | POA: Diagnosis not present

## 2023-06-02 LAB — GLUCOSE, CAPILLARY
Glucose-Capillary: 165 mg/dL — ABNORMAL HIGH (ref 70–99)
Glucose-Capillary: 166 mg/dL — ABNORMAL HIGH (ref 70–99)
Glucose-Capillary: 173 mg/dL — ABNORMAL HIGH (ref 70–99)
Glucose-Capillary: 173 mg/dL — ABNORMAL HIGH (ref 70–99)
Glucose-Capillary: 174 mg/dL — ABNORMAL HIGH (ref 70–99)
Glucose-Capillary: 179 mg/dL — ABNORMAL HIGH (ref 70–99)
Glucose-Capillary: 182 mg/dL — ABNORMAL HIGH (ref 70–99)

## 2023-06-02 LAB — PROTIME-INR
INR: 1.2 (ref 0.8–1.2)
Prothrombin Time: 15.5 seconds — ABNORMAL HIGH (ref 11.4–15.2)

## 2023-06-02 LAB — AEROBIC/ANAEROBIC CULTURE W GRAM STAIN (SURGICAL/DEEP WOUND)

## 2023-06-02 MED ORDER — LIDOCAINE HCL (PF) 1 % IJ SOLN
10.0000 mL | Freq: Once | INTRAMUSCULAR | Status: AC
  Administered 2023-06-02: 10 mL via INTRADERMAL

## 2023-06-02 MED ORDER — MIDAZOLAM HCL 2 MG/2ML IJ SOLN
INTRAMUSCULAR | Status: AC | PRN
Start: 2023-06-02 — End: 2023-06-02
  Administered 2023-06-02 (×2): 1 mg via INTRAVENOUS

## 2023-06-02 MED ORDER — MIDAZOLAM HCL 2 MG/2ML IJ SOLN
INTRAMUSCULAR | Status: AC
  Filled 2023-06-02: qty 2

## 2023-06-02 MED ORDER — POTASSIUM CHLORIDE 10 MEQ/100ML IV SOLN
10.0000 meq | INTRAVENOUS | Status: AC
  Administered 2023-06-02 (×3): 10 meq via INTRAVENOUS
  Filled 2023-06-02 (×3): qty 100

## 2023-06-02 MED ORDER — FENTANYL CITRATE (PF) 100 MCG/2ML IJ SOLN
INTRAMUSCULAR | Status: AC
  Filled 2023-06-02: qty 2

## 2023-06-02 MED ORDER — FENTANYL CITRATE (PF) 100 MCG/2ML IJ SOLN
INTRAMUSCULAR | Status: AC | PRN
Start: 2023-06-02 — End: 2023-06-02
  Administered 2023-06-02: 50 ug via INTRAVENOUS
  Administered 2023-06-02: 25 ug via INTRAVENOUS

## 2023-06-02 MED ORDER — TRAVASOL 10 % IV SOLN
INTRAVENOUS | Status: AC
  Filled 2023-06-02: qty 1512

## 2023-06-02 NOTE — Progress Notes (Signed)
[facility] OB/GYN Attending Progress Note  ADMISSION DIAGNOSIS:   Principal Problem:   Ileus (HCC) Active Problems:   Hypoglycemia   Intestinal obstruction (HCC)   Malnutrition of moderate degree   Subjective  Pt resting comfortably in bed, no acute complaints overnight.  Pain well controlled.  Passing flatus, no BM.  Denies nausea/vomiting.  Pt using bedside commode to void.  Denies fever/chills overnight.  Denies CP/SOB. States she feels about the same.   Objective  VITALS:  height is 4' 9.01" (1.448 m) and weight is 83.1 kg. Her oral temperature is 99.6 F (37.6 C). Her blood pressure is 135/93 (abnormal) and her pulse is 120 (abnormal). Her respiration is 20 and oxygen saturation is 95%.   EXAMINATION: CONSTITUTIONAL: Well-developed, well-nourished female in no acute distress.  EYES:  No scleral icterus.  SKIN: Skin is warm and dry. No rash noted. Not diaphoretic. NEUROLOGIC: Alert and oriented to person, place, and time. PSYCHIATRIC: Normal mood and affect. Normal behavior. CARDIOVASCULAR: +tachycardia noted RESPIRATORY: CTAB, normal respiratory rate ABDOMEN: Soft, mild distension, some BS noted.  Bandage recently changed- appears clean and dry. JP drain in place serosanguinous fluid. Ext: No calf tenderness bilaterally, no edema  Results for orders placed or performed during the hospital encounter of 05/27/23 (from the past 24 hour(s))  Glucose, capillary     Status: Abnormal   Collection Time: 06/01/23 11:22 AM  Result Value Ref Range   Glucose-Capillary 162 (H) 70 - 99 mg/dL  Procalcitonin     Status: None   Collection Time: 06/01/23 11:34 AM  Result Value Ref Range   Procalcitonin 0.76 ng/mL  Culture, blood (Routine X 2) w Reflex to ID Panel     Status: None (Preliminary result)   Collection Time: 06/01/23 11:35 AM   Specimen: BLOOD LEFT ARM  Result Value Ref Range   Specimen Description BLOOD LEFT ARM    Special Requests       BOTTLES DRAWN AEROBIC AND ANAEROBIC Blood Culture adequate volume   Culture      NO GROWTH < 24 HOURS Performed at [facility], [address], [place], [place] [address]    Report Status PENDING   Glucose, capillary     Status: Abnormal   Collection Time: 06/01/23  4:09 PM  Result Value Ref Range   Glucose-Capillary 178 (H) 70 - 99 mg/dL  Glucose, capillary     Status: Abnormal   Collection Time: 06/01/23  8:12 PM  Result Value Ref Range   Glucose-Capillary 168 (H) 70 - 99 mg/dL  Glucose, capillary     Status: Abnormal   Collection Time: 06/02/23 12:08 AM  Result Value Ref Range   Glucose-Capillary 179 (H) 70 - 99 mg/dL   Comment 1 Notify RN    Comment 2 Document in Chart   Glucose, capillary     Status: Abnormal   Collection Time: 06/02/23  4:09 AM  Result Value Ref Range   Glucose-Capillary 166 (H) 70 - 99 mg/dL   Comment 1 Notify RN    Comment 2 Document in Chart   CBC with Differential/Platelet     Status: Abnormal   Collection Time: 06/02/23  5:15 AM  Result Value Ref Range   WBC 16.3 (H) 4.0 - 10.5 K/uL   RBC 3.44 (L) 3.87 - 5.11 MIL/uL   Hemoglobin 9.5 (L) 12.0 - 15.0 g/dL   HCT 60.4 (L) 54.0 - 98.1 %  MCV 83.7 80.0 - 100.0 fL   MCH 27.6 26.0 - 34.0 pg   MCHC 33.0 30.0 - 36.0 g/dL   RDW 19.1 47.8 - 29.5 %   Platelets 398 150 - 400 K/uL   nRBC 0.0 0.0 - 0.2 %   Neutrophils Relative % 55 %   Neutro Abs 9.1 (H) 1.7 - 7.7 K/uL   Lymphocytes Relative 17 %   Lymphs Abs 2.8 0.7 - 4.0 K/uL   Monocytes Relative 15 %   Monocytes Absolute 2.4 (H) 0.1 - 1.0 K/uL   Eosinophils Relative 2 %   Eosinophils Absolute 0.3 0.0 - 0.5 K/uL   Basophils Relative 1 %   Basophils Absolute 0.1 0.0 - 0.1 K/uL   WBC Morphology DOHLE BODIES    RBC Morphology MORPHOLOGY UNREMARKABLE    Smear Review Reviewed    Immature Granulocytes 10 %   Abs Immature Granulocytes 1.58 (H) 0.00 - 0.07 K/uL  Basic metabolic panel     Status: Abnormal   Collection Time: 06/02/23  5:15 AM   Result Value Ref Range   Sodium 138 135 - 145 mmol/L   Potassium 3.7 3.5 - 5.1 mmol/L   Chloride 104 98 - 111 mmol/L   CO2 24 22 - 32 mmol/L   Glucose, Bld 169 (H) 70 - 99 mg/dL   BUN 15 6 - 20 mg/dL   Creatinine, Ser 6.21 0.44 - 1.00 mg/dL   Calcium 8.4 (L) 8.9 - 10.3 mg/dL   GFR, Estimated >30 >86 mL/min   Anion gap 10 5 - 15     ASSESSMENT/PLAN: POD#6 s/p re-exploration, hernia reduction, vicryl mesh underlay and fascial closure Initial surgery 7/20- s/p  ex-lap with extensive LOA, evacuation of hemorrhagic ovarian cyst and serosal repair of colon 05/20/23  Fascial dehiscence of Pfannenstiel incision and small bowel herniation with obstruction   1)Low grade temp  -CT completed- small fluid collection- referral to IR for possible drainage -currently on empiric zosyn -CXR with moderate atelectasis, IS ordered -Urine culture ordered to r/o UTI   2) Postop ileus -NG in place with moderate bilious content -per gen surgery- slowly advancing to sips/chips -TPN ordered -strongly encouraged pt to work with PT regarding mobility  3) FEN, DM2 -management per IM -on sliding scale for insulin  4) DVT Prophylaxis:  heparin   DISPO: Will continue to follow, appreciate management per IM and general surgery  Myna Hidalgo, DO Attending Obstetrician & Gynecologist, [facility] for [employer], [facility]

## 2023-06-02 NOTE — Progress Notes (Signed)
   06/01/23 2132  Assess: MEWS Score  Temp 100.2 F (37.9 C)  BP (!) 120/93  MAP (mmHg) 101  Pulse Rate (!) 130 at rest  ECG Heart Rate (!) 130 ar rest  Resp (!) 23  Level of Consciousness Alert  SpO2 96 %  O2 Device Room Air  Assess: MEWS Score  MEWS Temp 0  MEWS Systolic 0  MEWS Pulse 3  MEWS RR 1  MEWS LOC 0  MEWS Score 4  MEWS Score Color Red  Assess: SIRS CRITERIA  SIRS Temperature  0  SIRS Pulse 1  SIRS Respirations  1  SIRS WBC 0  SIRS Score Sum  2     Pt has been alert and fully oriented x 4. Pt's MEWS Score is red due sinus tachycardia, HR 130 with low grade of fever, BP stable. Dr. Misty Stanley is notified. We receive orders for blood culture x 2, Tylenol IV and 1000 ml NSS bolus. Pain is tolerated after Dilaludid 2 mg, PRN Q 2-3 hrs.   We will continue to monitor.   Filiberto Pinks, RN

## 2023-06-02 NOTE — Procedures (Signed)
Interventional Radiology Procedure Note  Procedure: Placement of 75F drain by CT.  Small volume turbid peach colored fluid evacuated.  Sent for culture  Complications: None  Estimated Blood Loss: None  Recommendations: - Bad to JP - Flush Q shift  Signed,  Sterling Big, MD

## 2023-06-02 NOTE — Progress Notes (Signed)
Progress Note  6 Days Post-Op  Subjective: Pt reports stable abdominal pain. Still passing some flatus but no BM. Drainage from incision stable. Discussed CT results and plan to have IR evaluate for possible drain placement.   Objective: Vital signs in last 24 hours: Temp:  [98.8 F (37.1 C)-100.2 F (37.9 C)] 99.6 F (37.6 C) (08/02 0750) Pulse Rate:  [111-130] 120 (08/02 0750) Resp:  [15-29] 20 (08/02 0750) BP: (105-145)/(82-98) 135/93 (08/02 0750) SpO2:  [91 %-99 %] 95 % (08/02 0750) Weight:  [83.1 kg] 83.1 kg (08/02 0410) Last BM Date : 05/19/23 (PTA per previous document)  Intake/Output from previous day: 08/01 0701 - 08/02 0700 In: 4321 [I.V.:4118.9; IV Piggyback:202.1] Out: 1840 [Emesis/NG output:1800; Drains:40] Intake/Output this shift: No intake/output data recorded.  PE: General: pleasant, WD, WN female who is laying in bed in NAD Heart: sinus tachy Lungs: Respiratory effort nonlabored Abd: appropriately ttp, abdominal dressings clean, abdominal binder present, drain SS, NGT with bilious output Psych: A&Ox3 with an appropriate affect.    Lab Results:  Recent Labs    06/01/23 0203 06/02/23 0515  WBC 14.3* 16.3*  HGB 10.1* 9.5*  HCT 30.6* 28.8*  PLT 404* 398   BMET Recent Labs    06/01/23 0203 06/02/23 0515  NA 140 138  K 3.8 3.7  CL 102 104  CO2 24 24  GLUCOSE 164* 169*  BUN 16 15  CREATININE 0.72 0.76  CALCIUM 8.9 8.4*   PT/INR No results for input(s): "LABPROT", "INR" in the last 72 hours. CMP     Component Value Date/Time   NA 138 06/02/2023 0515   K 3.7 06/02/2023 0515   CL 104 06/02/2023 0515   CO2 24 06/02/2023 0515   GLUCOSE 169 (H) 06/02/2023 0515   BUN 15 06/02/2023 0515   CREATININE 0.76 06/02/2023 0515   CALCIUM 8.4 (L) 06/02/2023 0515   PROT 6.1 (L) 06/01/2023 0203   ALBUMIN 2.4 (L) 06/01/2023 0203   AST 17 06/01/2023 0203   ALT 18 06/01/2023 0203   ALKPHOS 62 06/01/2023 0203   BILITOT 0.4 06/01/2023 0203    GFRNONAA >60 06/02/2023 0515   GFRAA >60 05/31/2018 1753   Lipase     Component Value Date/Time   LIPASE 38 05/27/2023 1243       Studies/Results: CT CHEST ABDOMEN PELVIS W CONTRAST  Result Date: 06/01/2023 CLINICAL DATA:  Abdominal pain, post-op EXAM: CT CHEST, ABDOMEN, AND PELVIS WITH CONTRAST TECHNIQUE: Multidetector CT imaging of the chest, abdomen and pelvis was performed following the standard protocol during bolus administration of intravenous contrast. RADIATION DOSE REDUCTION: This exam was performed according to the departmental dose-optimization program which includes automated exposure control, adjustment of the mA and/or kV according to patient size and/or use of iterative reconstruction technique. CONTRAST:  75mL OMNIPAQUE IOHEXOL 350 MG/ML SOLN COMPARISON:  May 27, 2023. April 21, 2015 FINDINGS: CT CHEST FINDINGS Cardiovascular: Heart is normal in size. No pericardial effusion. RIGHT upper extremity PICC tip terminates at the superior cavoatrial junction. Thoracic aorta is normal in course and caliber. Mediastinum/Nodes: Visualized thyroid is unremarkable. No axillary or mediastinal adenopathy. Lungs/Pleura: No pleural effusion or pneumothorax. Moderate bibasilar predominant platelike enhancing consolidative opacities most consistent with atelectasis. Scattered biapical centrilobular ground-glass opacities. Musculoskeletal: No chest wall mass or suspicious bone lesions identified. CT ABDOMEN PELVIS FINDINGS Hepatobiliary: No focal liver abnormality is seen. No gallstones, gallbladder wall thickening, or biliary dilatation. Pancreas: Unremarkable. No pancreatic ductal dilatation or surrounding inflammatory changes. Spleen: Normal in size without  focal abnormality. Adrenals/Urinary Tract: Adrenal glands are unremarkable. Kidneys enhance symmetrically. No hydronephrosis. No obstructing nephrolithiasis. Small volume intraluminal air within the otherwise unremarkable bladder. Stomach/Bowel:  Enteric tube terminates in the stomach. There are multiple dilated loops of small bowel in the upper and mid abdomen. Ileum is decompressed. There is a possible transition point in the RIGHT lower quadrant (series 6, image 77, series 7, image 77; series 3, image 80). This area is just superior to the postsurgical changes. Several prominent areas of enhancement in the small bowel, favored reactive. Appendix is normal. Contrast delineates the proximal small bowel. Vascular/Lymphatic: No significant vascular findings are present. No enlarged abdominal or pelvic lymph nodes. Reproductive: Status post hysterectomy. No adnexal masses. Other: Patient is status post surgical repair of an inferior ventral hernia of the abdominal wall. Surgical drain is in the anterior subcutaneous soft tissues. There is a small volume subcutaneous air with adjacent fat stranding in the subcutaneous tissues. Mild expansion of the abdominal musculature at this level, likely postsurgical. No discrete hematoma. Deep to this there is a rim enhancing fluid collection that is intraperitoneal in location. This measures approximately 7.4 x 3.3 by 5.3 cm. This layers superior to the bladder Musculoskeletal: No acute or significant osseous findings. Transitional anatomy with bilateral assimilation joints at L5-S1. Unchanged sclerotic area of L1 since at least 2016, consistent with a benign fibro-osseous lesion. IMPRESSION: 1. There are multiple dilated loops of small bowel in the upper and mid abdomen with a possible transition point in the RIGHT lower quadrant. Findings are may reflect small bowel obstruction versus postsurgical ileus. Recommend serial abdominal radiographs to assess for progression of enteric contrast. 2. Patient is status post surgical repair of an inferior ventral hernia of the abdominal wall. There is a rim enhancing fluid collection deep to the surgical site measuring up to 7.4 cm. Differential considerations include postsurgical  seroma versus abscess. Recommend correlation with clinical symptomatology 3. Small volume intraluminal air within the otherwise unremarkable bladder. This is favored to be secondary to recent instrumentation. 4. Moderate bibasilar predominant atelectasis. 5. Scattered biapical centrilobular ground-glass opacities could reflect aspiration, infection or atelectasis. Electronically Signed   By: Meda Klinefelter M.D.   On: 06/01/2023 16:25   DG CHEST PORT 1 VIEW  Result Date: 06/01/2023 CLINICAL DATA:  Fever, shortness of breath EXAM: PORTABLE CHEST 1 VIEW COMPARISON:  None Available. FINDINGS: Transverse diameter of heart is increased. There are no signs of alveolar pulmonary edema. Low lung volumes. Linear densities are seen in lower lung fields. There is no focal consolidation. There is no pleural effusion or pneumothorax. Tip of right PICC line is seen at the junction of superior vena cava and right atrium. Tip of NG tube is seen in the stomach. Side port in the NG tube is noted at the gastroesophageal junction. IMPRESSION: Linear densities are seen in the lower lung fields suggesting subsegmental atelectasis. There are no signs of pulmonary edema or focal pulmonary consolidation. Side-port in NG tube is noted at the gastroesophageal junction. NG tube could be advanced 5-10 cm to place the side port within the stomach. Electronically Signed   By: Ernie Avena M.D.   On: 06/01/2023 09:25    Anti-infectives: Anti-infectives (From admission, onward)    Start     Dose/Rate Route Frequency Ordered Stop   06/01/23 1400  piperacillin-tazobactam (ZOSYN) IVPB 3.375 g        3.375 g 12.5 mL/hr over 240 Minutes Intravenous Every 8 hours 06/01/23 1312  05/27/23 1545  ceFAZolin (ANCEF) IVPB 2g/100 mL premix       Placed in "And" Linked Group   2 g 200 mL/hr over 30 Minutes Intravenous On call to O.R. 05/27/23 1539 05/27/23 1655   05/27/23 1545  metroNIDAZOLE (FLAGYL) IVPB 500 mg       Placed in "And"  Linked Group   500 mg 100 mL/hr over 60 Minutes Intravenous On call to O.R. 05/27/23 1539 05/27/23 1753   05/27/23 1545  metroNIDAZOLE (FLAGYL) 500 MG/100ML IVPB       Note to Pharmacy: Crissie Sickles: cabinet override      05/27/23 1545 05/27/23 1653   05/27/23 1545  ceFAZolin (ANCEF) 2-4 GM/100ML-% IVPB       Note to Pharmacy: Crissie Sickles: cabinet override      05/27/23 1545 05/27/23 1651        Assessment/Plan S/p  ex-lap with extensive LOA, evacuation of hemorrhagic ovarian cyst and serosal repair of colon 05/20/23  Fascial dehiscence of Pfannenstiel incision and small bowel herniation with obstruction  POD6 s/p re-exploration, hernia reduction, vicryl mesh underlay and fascial closure   - ileus post-op although passing flatus - TPN, NGT, ok to have ice chips and sips of clears - CT yesterday with intraperitoneal collection in pelvis - IR consult for possible drain placement, did not show significant collection in abdominal wall  - binder and continue drain in SQ, monitoring incision closely  - mobilize as able - WBC 16 and low grade fevers - zosyn started yesterday   FEN: ice chips/sips, TPN, NGT to LIWS VTE: SQH ID: zosyn  - per primary attending -  T2DM AKI on CKD  LOS: 6 days     Juliet Rude, [facility] 06/02/2023, 10:11 AM Please see Amion for pager number during day hours 7:00am-4:30pm

## 2023-06-02 NOTE — Consult Note (Signed)
Chief Complaint: Patient was seen in consultation today for fascial dehiscence POD 10 L hemorrhage ovarian cyst.    Referring Physician(s): Dr. Elige Radon, PA-C  Supervising Physician: Malachy Moan  Patient Status: [facility] - In-pt  History of Present Illness: Maria [name] is a 42 y.o. female with past medical history DM, uterine fibroids s/p 10 from ex lap of L hemorrhagic ovarian cyst and extensive lysis of adhesions with sigmoid colon serosal tear repair 7/20.  She was originally discharged home 7/25 but returned 7/27 with abdominal pain, vomiting.  Found to have ileus requiring NGT and TPN initiation.  Spiked fevers yesterday prompting CT Abdomen Pelvis who showed: 1. There are multiple dilated loops of small bowel in the upper and mid abdomen with a possible transition point in the RIGHT lower quadrant. Findings are may reflect small bowel obstruction versus postsurgical ileus. Recommend serial abdominal radiographs to assess for progression of enteric contrast. 2. Patient is status post surgical repair of an inferior ventral hernia of the abdominal wall. There is a rim enhancing fluid collection deep to the surgical site measuring up to 7.4 cm. Differential considerations include postsurgical seroma versus abscess. Recommend correlation with clinical symptomatology 3. Small volume intraluminal air within the otherwise unremarkable bladder. This is favored to be secondary to recent instrumentation. 4. Moderate bibasilar predominant atelectasis. 5. Scattered biapical centrilobular ground-glass opacities could reflect aspiration, infection or atelectasis.  IR consulted for aspiration and drainage of intra-abdominal fluid collection.  Patient assessed at bedside alongside family.  She is understanding of the goals of the procedure today and is agreeable to proceed.   She is NPO with NGT in place.   Past Medical History:  Diagnosis Date   Acid reflux    Chronic kidney  disease    kidney stones    Diabetes mellitus without complication (HCC)    Dysmenorrhea    Fibroids    Headache    Kidney stone     Past Surgical History:  Procedure Laterality Date   ABDOMINAL HYSTERECTOMY N/A 05/27/2015   Procedure: HYSTERECTOMY ABDOMINAL;  Surgeon: Myna Hidalgo, DO;  Location: [facility];  Service: Gynecology;  Laterality: N/A;  669.1g    BILATERAL SALPINGECTOMY Bilateral 05/27/2015   Procedure: BILATERAL SALPINGECTOMY;  Surgeon: Myna Hidalgo, DO;  Location: [facility];  Service: Gynecology;  Laterality: Bilateral;   CESAREAN SECTION     INSERTION OF MESH  05/27/2023   Procedure: INSERTION OF MESH, VICRYL;  Surgeon: Fritzi Mandes, MD;  Location: [facility] OR;  Service: General;;   LAPAROSCOPY N/A 05/20/2023   Procedure: LAPAROSCOPY DIAGNOSTIC;  Surgeon: Warden Fillers, MD;  Location: [facility] OR;  Service: Gynecology;  Laterality: N/A;   LAPAROTOMY  05/20/2023   Procedure: EXPLORATORY LAPAROTOMY WITH LYSIS OF ADHESIONS, SEROSA REPAIR, EVACUATION OF HEMMORHAGIC OVARIAN CYST;  Surgeon: Warden Fillers, MD;  Location: [facility] OR;  Service: Gynecology;;   LAPAROTOMY N/A 05/27/2023   Procedure: RE-EXPLORATORY LAPAROTOMY, FASCIAL CLOSURE;  Surgeon: Fritzi Mandes, MD;  Location: [facility] OR;  Service: General;  Laterality: N/A;   LITHOTRIPSY      Allergies: Patient has no known allergies.  Medications: Prior to Admission medications   Medication Sig Start Date End Date Taking? Authorizing Provider  albuterol (PROVENTIL HFA;VENTOLIN HFA) 108 (90 BASE) MCG/ACT inhaler Inhale 1-2 puffs into the lungs every 6 (six) hours as needed for wheezing or shortness of breath. 10/10/15  Yes Lorre Nick, MD  atorvastatin (LIPITOR) 10 MG tablet Take 10 mg by mouth daily. 04/08/23  Yes [provider]  Dulaglutide (TRULICITY) 0.75 MG/0.5ML SOPN Inject 0.75 mg into the skin every Sunday.   Yes [provider]  escitalopram (LEXAPRO) 20 MG tablet Take 20 mg by mouth daily.   Yes [provider]  fluticasone (FLONASE) 50 MCG/ACT nasal spray Place 1 spray into both nostrils 2 (two) times daily as needed for allergies or rhinitis. 02/17/23  Yes [provider]  glyBURIDE (DIABETA) 5 MG tablet Take 1 tablet (5 mg total) by mouth 2 (two) times daily with a meal. 05/25/23  Yes Hermina Staggers, MD  hydrOXYzine (ATARAX) 25 MG tablet Take 25 mg by mouth every 8 (eight) hours as needed (sleep).   Yes [provider]  ibuprofen (ADVIL) 600 MG tablet Take 1 tablet (600 mg total) by mouth every 6 (six) hours as needed. Patient taking differently: Take 600 mg by mouth every 6 (six) hours as needed (for pain). 05/25/23  Yes Hermina Staggers, MD  ondansetron (ZOFRAN-ODT) 4 MG disintegrating tablet Take 1 tablet (4 mg total) by mouth every 8 (eight) hours as needed for nausea. Patient taking differently: Take 4 mg by mouth every 8 (eight) hours as needed for nausea (DISSOLVE ORALLY). 05/25/23  Yes Hermina Staggers, MD  oxyCODONE (OXY IR/ROXICODONE) 5 MG immediate release tablet Take 1 tablet (5 mg total) by mouth every 6 (six) hours as needed for moderate pain. 05/25/23  Yes Hermina Staggers, MD     Family History  Problem Relation Age of Onset   Diabetes Mother    Hypertension Mother    Heart attack Mother    Healthy Father     Social History   Socioeconomic History   Marital status: Single    Spouse name: Not on file   Number of children: 1   Years of education: some college   Highest education level: Not on file  Occupational History   Occupation: works in Museum/gallery curator  Tobacco Use   Smoking status: Every Day    Current packs/day: 0.50    Types: Cigarettes   Smokeless tobacco: Never  Vaping Use   Vaping status: Never Used  Substance and Sexual Activity   Alcohol use: Yes    Comment: social    Drug use: No   Sexual activity: Not on file  Other Topics Concern   Not on file  Social History Narrative   Lives at home with her partner, Inetta Fermo and  children.   Right-handed.   One cup caffeine per day.   Social Determinants of Health   Financial Resource Strain: Not on file  Food Insecurity: No Food Insecurity (05/19/2023)   Hunger Vital Sign    Worried About Running Out of Food in the Last Year: Never true    Ran Out of Food in the Last Year: Never true  Transportation Needs: No Transportation Needs (05/19/2023)   PRAPARE - Administrator, Civil Service (Medical): No    Lack of Transportation (Non-Medical): No  Physical Activity: Not on file  Stress: Not on file  Social Connections: Not on file     Review of Systems: A 12 point ROS discussed and pertinent positives are indicated in the HPI above.  All other systems are negative.  Review of Systems  Constitutional:  Positive for fever. Negative for fatigue.  Respiratory:  Negative for cough.   Gastrointestinal:  Positive for abdominal distention, abdominal pain and nausea.  Musculoskeletal:  Negative for back pain.  Psychiatric/Behavioral:  Negative for  behavioral problems.     Vital Signs: BP 139/83 (BP Location: Right Arm)   Pulse (!) 133   Temp 99.9 F (37.7 C) (Oral)   Resp 20   Ht 4' 9.01" (1.448 m)   Wt 183 lb 4.8 oz (83.1 kg)   LMP 05/12/2015 (Exact Date)   SpO2 95%   BMI 39.65 kg/m   Physical Exam Vitals and nursing note reviewed.  Constitutional:      General: She is not in acute distress.    Appearance: Normal appearance. She is not ill-appearing.  Cardiovascular:     Rate and Rhythm: Normal rate and regular rhythm.  Pulmonary:     Effort: Pulmonary effort is normal.  Abdominal:     General: Abdomen is flat.  Skin:    General: Skin is warm.  Neurological:     General: No focal deficit present.     Mental Status: She is alert and oriented to person, place, and time. Mental status is at baseline.  Psychiatric:        Mood and Affect: Mood normal.        Behavior: Behavior normal.        Thought Content: Thought content normal.       MD Evaluation Airway: WNL Heart: WNL Abdomen: WNL Chest/ Lungs: WNL ASA  Classification: 3   Imaging: CT CHEST ABDOMEN PELVIS W CONTRAST  Result Date: 06/01/2023 CLINICAL DATA:  Abdominal pain, post-op EXAM: CT CHEST, ABDOMEN, AND PELVIS WITH CONTRAST TECHNIQUE: Multidetector CT imaging of the chest, abdomen and pelvis was performed following the standard protocol during bolus administration of intravenous contrast. RADIATION DOSE REDUCTION: This exam was performed according to the departmental dose-optimization program which includes automated exposure control, adjustment of the mA and/or kV according to patient size and/or use of iterative reconstruction technique. CONTRAST:  75mL OMNIPAQUE IOHEXOL 350 MG/ML SOLN COMPARISON:  May 27, 2023. April 21, 2015 FINDINGS: CT CHEST FINDINGS Cardiovascular: Heart is normal in size. No pericardial effusion. RIGHT upper extremity PICC tip terminates at the superior cavoatrial junction. Thoracic aorta is normal in course and caliber. Mediastinum/Nodes: Visualized thyroid is unremarkable. No axillary or mediastinal adenopathy. Lungs/Pleura: No pleural effusion or pneumothorax. Moderate bibasilar predominant platelike enhancing consolidative opacities most consistent with atelectasis. Scattered biapical centrilobular ground-glass opacities. Musculoskeletal: No chest wall mass or suspicious bone lesions identified. CT ABDOMEN PELVIS FINDINGS Hepatobiliary: No focal liver abnormality is seen. No gallstones, gallbladder wall thickening, or biliary dilatation. Pancreas: Unremarkable. No pancreatic ductal dilatation or surrounding inflammatory changes. Spleen: Normal in size without focal abnormality. Adrenals/Urinary Tract: Adrenal glands are unremarkable. Kidneys enhance symmetrically. No hydronephrosis. No obstructing nephrolithiasis. Small volume intraluminal air within the otherwise unremarkable bladder. Stomach/Bowel: Enteric tube terminates in the stomach.  There are multiple dilated loops of small bowel in the upper and mid abdomen. Ileum is decompressed. There is a possible transition point in the RIGHT lower quadrant (series 6, image 77, series 7, image 77; series 3, image 80). This area is just superior to the postsurgical changes. Several prominent areas of enhancement in the small bowel, favored reactive. Appendix is normal. Contrast delineates the proximal small bowel. Vascular/Lymphatic: No significant vascular findings are present. No enlarged abdominal or pelvic lymph nodes. Reproductive: Status post hysterectomy. No adnexal masses. Other: Patient is status post surgical repair of an inferior ventral hernia of the abdominal wall. Surgical drain is in the anterior subcutaneous soft tissues. There is a small volume subcutaneous air with adjacent fat stranding in the subcutaneous tissues. Mild expansion  of the abdominal musculature at this level, likely postsurgical. No discrete hematoma. Deep to this there is a rim enhancing fluid collection that is intraperitoneal in location. This measures approximately 7.4 x 3.3 by 5.3 cm. This layers superior to the bladder Musculoskeletal: No acute or significant osseous findings. Transitional anatomy with bilateral assimilation joints at L5-S1. Unchanged sclerotic area of L1 since at least 2016, consistent with a benign fibro-osseous lesion. IMPRESSION: 1. There are multiple dilated loops of small bowel in the upper and mid abdomen with a possible transition point in the RIGHT lower quadrant. Findings are may reflect small bowel obstruction versus postsurgical ileus. Recommend serial abdominal radiographs to assess for progression of enteric contrast. 2. Patient is status post surgical repair of an inferior ventral hernia of the abdominal wall. There is a rim enhancing fluid collection deep to the surgical site measuring up to 7.4 cm. Differential considerations include postsurgical seroma versus abscess. Recommend  correlation with clinical symptomatology 3. Small volume intraluminal air within the otherwise unremarkable bladder. This is favored to be secondary to recent instrumentation. 4. Moderate bibasilar predominant atelectasis. 5. Scattered biapical centrilobular ground-glass opacities could reflect aspiration, infection or atelectasis. Electronically Signed   By: Meda Klinefelter M.D.   On: 06/01/2023 16:25   DG CHEST PORT 1 VIEW  Result Date: 06/01/2023 CLINICAL DATA:  Fever, shortness of breath EXAM: PORTABLE CHEST 1 VIEW COMPARISON:  None Available. FINDINGS: Transverse diameter of heart is increased. There are no signs of alveolar pulmonary edema. Low lung volumes. Linear densities are seen in lower lung fields. There is no focal consolidation. There is no pleural effusion or pneumothorax. Tip of right PICC line is seen at the junction of superior vena cava and right atrium. Tip of NG tube is seen in the stomach. Side port in the NG tube is noted at the gastroesophageal junction. IMPRESSION: Linear densities are seen in the lower lung fields suggesting subsegmental atelectasis. There are no signs of pulmonary edema or focal pulmonary consolidation. Side-port in NG tube is noted at the gastroesophageal junction. NG tube could be advanced 5-10 cm to place the side port within the stomach. Electronically Signed   By: Ernie Avena M.D.   On: 06/01/2023 09:25   DG CHEST PORT 1 VIEW  Result Date: 05/29/2023 CLINICAL DATA:  Nausea, emesis, leukocytosis EXAM: PORTABLE CHEST 1 VIEW COMPARISON:  Chest radiograph 2 days prior FINDINGS: The enteric catheter tip is in the stomach. The side-hole is suspected to be at the level of the GE junction. The right upper extremity PICC tip is in the right atrium. The cardiomediastinal silhouette is stable, allowing for lower lung volumes Lung volumes are low with asymmetric elevation of the right hemidiaphragm. There is no focal airspace consolidation. There is no  pulmonary edema. There is no significant pleural effusion. There is no pneumothorax There is no acute osseous abnormality. IMPRESSION: 1. Enteric catheter side hole suspected to be at the level of the GE junction. Recommend advancement by approximately 5 cm. 2. Low lung volumes.  No focal consolidation or pleural effusion. Electronically Signed   By: Lesia Hausen M.D.   On: 05/29/2023 14:30   [place] EKG SITE RITE  Result Date: 05/28/2023 If Site Rite image not attached, placement could not be confirmed due to current cardiac rhythm.  CT ABDOMEN PELVIS W CONTRAST  Result Date: 05/27/2023 CLINICAL DATA:  Acute generalized abdominal pain EXAM: CT ABDOMEN AND PELVIS WITH CONTRAST TECHNIQUE: Multidetector CT imaging of the abdomen and pelvis was performed  using the standard protocol following bolus administration of intravenous contrast. RADIATION DOSE REDUCTION: This exam was performed according to the departmental dose-optimization program which includes automated exposure control, adjustment of the mA and/or kV according to patient size and/or use of iterative reconstruction technique. CONTRAST:  75mL OMNIPAQUE IOHEXOL 350 MG/ML SOLN COMPARISON:  May 19, 2023. FINDINGS: Lower chest: No acute abnormality. Hepatobiliary: No focal liver abnormality is seen. No gallstones, gallbladder wall thickening, or biliary dilatation. Pancreas: Unremarkable. No pancreatic ductal dilatation or surrounding inflammatory changes. Spleen: Normal in size without focal abnormality. Adrenals/Urinary Tract: Adrenal glands are unremarkable. Kidneys are normal, without renal calculi, focal lesion, or hydronephrosis. Bladder is unremarkable. Stomach/Bowel: There is now noted moderate to severe gastric distention as well as moderate to severe small bowel dilatation due to interval development of large ventral hernia seen anteriorly in the pelvis. This hernia contains multiple loops of small bowel, several which are dilated. This appears to  be resulting at least partial obstruction of the more proximal small bowel loops. The colon is unremarkable and contains stool. The appendix is unremarkable. 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 No other vascular abnormality is noted. No adenopathy is noted. Reproductive: Status post hysterectomy and bilateral salpingectomy. No definite adnexal abnormality is noted currently. Other: No definite ascites is noted. Postsurgical staples are seen involving the lower abdominal wall and along the inferior margin of previously described large hernia. Musculoskeletal: No acute or significant osseous findings. IMPRESSION: There is interval development of large ventral hernia anteriorly in the pelvis which contains multiple loops of small bowel, several of which are dilated. This appears to be resulting in at least partial obstruction of the more proximal small bowel loops, as well as moderate to severe gastric distention. There are noted surgical clips involving the lower anterior abdominal wall in the vicinity of the hernia. Electronically Signed   By: Lupita Raider M.D.   On: 05/27/2023 15:15   CT HEAD WO CONTRAST ( )  Result Date: 05/27/2023 CLINICAL DATA:  Delirium. EXAM: CT HEAD WITHOUT CONTRAST TECHNIQUE: Contiguous axial images were obtained from the base of the skull through the vertex without intravenous contrast. RADIATION DOSE REDUCTION: This exam was performed according to the departmental dose-optimization program which includes automated exposure control, adjustment of the mA and/or kV according to patient size and/or use of iterative reconstruction technique. COMPARISON:  05/25/2014 FINDINGS: Brain: No evidence of intracranial hemorrhage, acute infarction, hydrocephalus, extra-axial collection, or mass lesion/mass effect. Vascular:  No hyperdense vessel or other acute findings. Skull: No evidence of  fracture or other significant bone abnormality. Sinuses/Orbits:  No acute findings. Other: None. IMPRESSION: Negative. Electronically Signed   By: Danae Orleans M.D.   On: 05/27/2023 15:09   DG Abdomen 1 View  Result Date: 05/27/2023 CLINICAL DATA:  Nausea and vomiting EXAM: ABDOMEN - 1 VIEW COMPARISON:  None Available. FINDINGS: Multiple dilated loops of small bowel throughout the midabdomen. There is gas and stool in the colon. Surgical changes along the pelvis. Films are under penetrated. No obvious free air on these portable supine radiographs. The diaphragm is clipped off the edge of the film. IMPRESSION: Surgical changes along the pelvis. Multiple dilated loops of bowel in the midabdomen. Ileus versus obstruction is possible. Please correlate with clinical presentation and if needed follow up IV contrast CT scan when appropriate Electronically Signed   By: Karen Kays M.D.   On: 05/27/2023 13:49   DG  CHEST PORT 1 VIEW  Result Date: 05/27/2023 CLINICAL DATA:  Hypoxemia.  Nausea and vomiting EXAM: PORTABLE CHEST 1 VIEW COMPARISON:  X-ray 05/31/2018 FINDINGS: No consolidation, pneumothorax or effusion. No edema. Normal cardiopericardial silhouette. IMPRESSION: No acute cardiopulmonary disease. Electronically Signed   By: Karen Kays M.D.   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 EXAM: CT ANGIOGRAPHY ABDOMEN AND PELVIS WITH CONTRAST AND WITHOUT CONTRAST TECHNIQUE: Multidetector CT imaging of the abdomen and pelvis was performed using the standard protocol during bolus administration of intravenous contrast. Multiplanar reconstructed images and MIPs were obtained and reviewed to evaluate the vascular anatomy. RADIATION DOSE REDUCTION: This exam was performed according to the departmental dose-optimization program which includes automated exposure control, adjustment of  the mA and/or kV according to patient size and/or use of iterative reconstruction technique. CONTRAST:  OMNIPAQUE IOHEXOL 350 MG/ML SOLN COMPARISON:  Noncontrast CT abdomen pelvis from earlier the same day. FINDINGS: VASCULAR Aorta: Normal caliber aorta without aneurysm, dissection, vasculitis or significant stenosis. Celiac: The celiac is patent. Arising from a proximal branch of the left gastric artery, just medial to the lesser curvature of the stomach is a saccular, peripherally calcified and at least partially patent aneurysm measuring 1.1 x 0.9 x 0.6 cm (AP by trans by cc). SMA: Patent without evidence of aneurysm, dissection, vasculitis or significant stenosis. Renals: Single bilateral renal arteries are patent without evidence of aneurysm, dissection, vasculitis, fibromuscular dysplasia or significant stenosis. IMA: Patent without evidence of aneurysm, dissection, vasculitis or significant stenosis. Inflow: Patent without evidence of aneurysm, dissection, vasculitis or significant stenosis. Proximal Outflow: Bilateral common femoral and visualized portions of the superficial and profunda femoral arteries are patent without evidence of aneurysm, dissection, vasculitis or significant stenosis. Veins: No obvious venous abnormality within the limitations of this arterial phase study. Review of the MIP images confirms the above findings. NON-VASCULAR Lower chest: No acute abnormality. Hepatobiliary: No focal liver abnormality is seen. No gallstones, gallbladder wall thickening, or biliary dilatation. Pancreas: Unremarkable. No pancreatic ductal dilatation or surrounding inflammatory changes. Spleen: No splenic injury or perisplenic hematoma. Adrenals/Urinary Tract: Adrenal glands are unremarkable. Kidneys are normal, without renal calculi, focal lesion, or hydronephrosis. Bladder is unremarkable. Stomach/Bowel: Stomach is within normal limits. Appendix appears normal. No evidence of bowel wall thickening,  distention, or inflammatory changes. Lymphatic: No abdominopelvic lymphadenopathy. Reproductive: Again seen is a large left adnexal hypoattenuating mass measuring up to approximately 5.2 cm which contains a fluid-fluid level. No significant surrounding inflammatory changes. The uterus is surgically absent. Other: No abdominal wall hernia or abnormality. No abdominopelvic ascites. Musculoskeletal: No acute or significant osseous findings. 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 NON-VASCULAR Again seen is a large left adnexal cyst measuring up to approximately 5.2 cm which demonstrates a fluid-fluid level on this study, introducing differential diagnosis of hemorrhagic ovarian cyst. Marliss Coots, MD [facility] Electronically Signed   By: Marliss Coots M.D.   On: 05/19/2023 14:05   US PELVIC COMPLETE W TRANSVAGINAL AND TORSION R/O  Result Date: 05/19/2023 CLINICAL DATA:  Follow-up of prior ultrasound examination with concern for left ovarian torsion EXAM: TRANSABDOMINAL AND TRANSVAGINAL ULTRASOUND OF PELVIS DOPPLER ULTRASOUND OF OVARIES TECHNIQUE: Both transabdominal and transvaginal ultrasound examinations of the pelvis were performed. Transabdominal technique was  performed for global imaging of the pelvis including uterus, ovaries, adnexal regions, and pelvic cul-de-sac. It was necessary to proceed with endovaginal exam following the transabdominal exam to visualize the ovaries. Color and duplex Doppler ultrasound was utilized to evaluate blood flow to the ovaries. COMPARISON:  Ultrasound pelvis dated 05/19/2023, same day CTA abdomen and pelvis dated 05/19/2023 FINDINGS: Uterus Surgically absent Endometrium Surgically absent Right ovary Measurements: 3.6 x 3.0 x 2.3 cm = volume: 12.6 mL. Similar mildly heterogeneous appearance.  Left ovary Measurements: 5.4 x 4.6 x 4.2 cm = volume: 54.6 mL. Persistent heterogeneous echogenicity. Pulsed Doppler evaluation of both ovaries again demonstrates absent central arterial flow within the left ovary with a single focus of questionable venous waveforms. Peripheral waveforms are again seen. Right ovary demonstrates preserved internal arterial and venous waveforms. Other findings No abnormal free fluid. IMPRESSION: 1. Persistent findings of asymmetrically enlarged, diffusely echogenic left ovary with absence of central vascularity. While it is presumable that the left ovarian stroma is effaced and displaced peripherally due to a large hemorrhagic cyst/endometrioma, findings again raising suspicion for ovarian torsion. 2. Similar mildly heterogeneous appearance of the right ovary. Critical Value/emergent results were called by telephone at the time of interpretation on 05/19/2023 at 1:22 pm to provider HAYLEY NAASZ , who verbally acknowledged these results. Electronically Signed   By: Agustin Cree M.D.   On: 05/19/2023 13:23   US PELVIC COMPLETE W TRANSVAGINAL AND TORSION R/O  Result Date: 05/19/2023 CLINICAL DATA:  Acute onset left flank/left pelvic pain. EXAM: TRANSABDOMINAL AND TRANSVAGINAL ULTRASOUND OF PELVIS DOPPLER ULTRASOUND OF OVARIES TECHNIQUE: Both transabdominal and transvaginal ultrasound examinations of the pelvis were performed. Transabdominal technique was performed for global imaging of the pelvis including uterus, ovaries, adnexal regions, and pelvic cul-de-sac. It was necessary to proceed with endovaginal exam following the transabdominal exam to visualize the ovaries. Color and duplex Doppler ultrasound was utilized to evaluate blood flow to the ovaries. COMPARISON:  CT abdomen and pelvis dated 05/19/2023, MR pelvis dated 05/16/2016 FINDINGS: Uterus Surgically absent Endometrium Surgically absent Right ovary Measurements: 3.7 x 3.1 x 2.3 cm = volume: 13.7 mL. Mildly heterogeneous in  appearance. No adnexal mass. Left ovary Measurements: 5.2 x 5.1 x 4.2 cm = volume: 57.3 mL. Diffusely echogenic. Pulsed Doppler evaluation of both ovaries demonstrates slightly diminished but normal low-resistance arterial and venous waveforms in the right ovary and absent flow within the central left ovary. Arterial and venous waveforms are detected along the periphery of the left ovary. Other findings No abnormal free fluid. IMPRESSION: 1. Asymmetrically enlarged and edematous appearance of the left ovary with absent internal vascularity, suspicious for ovarian torsion in the setting of acute onset left pelvic pain. 2. Mildly heterogeneous appearance of the right ovary with slightly diminished but present internal vascularity. Findings may reflect an ovary containing multiple follicles/cysts, suboptimally seen on ultrasound examination. Recommend correlation with right pelvic tenderness on physical examination. Critical Value/emergent results were called by telephone at the time of interpretation on 05/19/2023 at 11:08 am to provider Dr. Jearld Fenton, who verbally acknowledged these results. Electronically Signed   By: Agustin Cree M.D.   On: 05/19/2023 11:14   CT Renal Stone Study  Result Date: 05/19/2023 CLINICAL DATA:  Abdominal/flank pain, stone suspected. Left flank pain. EXAM: CT ABDOMEN AND PELVIS WITHOUT CONTRAST TECHNIQUE: Multidetector CT imaging of the abdomen and pelvis was performed following the standard protocol without IV contrast. RADIATION DOSE REDUCTION: This exam was performed according to the departmental dose-optimization program which includes automated exposure  control, adjustment of the mA and/or kV according to patient size and/or use of iterative reconstruction technique. COMPARISON:  CT abdomen/pelvis 04/21/2015. FINDINGS: Lower chest: No acute abnormality. Hepatobiliary: No focal liver abnormality is seen. No gallstones, gallbladder wall thickening, or biliary dilatation. Pancreas:  Unremarkable. No pancreatic ductal dilatation or surrounding inflammatory changes. Spleen: Normal in size without focal abnormality. Adrenals/Urinary Tract: Adrenal glands are unremarkable. Bilateral punctate calyceal calculi. No hydronephrosis or hydroureter. Bladder is unremarkable. Stomach/Bowel: Normal stomach and duodenum. No dilated loops of small bowel. Normal appendix is visualized on axial image 39 series 2. Colon is unremarkable. No bowel wall thickening or surrounding inflammation. 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 No enlarged abdominal or pelvic lymph nodes. Reproductive: Prior hysterectomy. Markedly enlarged left ovary, measuring up to 5.0 cm (axial image 60 series 2). Other: No abdominal wall hernia or abnormality. No abdominopelvic ascites. Musculoskeletal: No acute or significant osseous findings. IMPRESSION: 1. Markedly enlarged left ovary, measuring up to 5.0 cm. Recommend pelvic ultrasound for further evaluation. 2. Bilateral punctate calyceal calculi. No hydronephrosis or hydroureter. 3. 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 CTA of the abdomen and pelvis is recommended for further characterization. Critical Value/emergent results were called by telephone at the time of interpretation on 05/19/2023 at 9:22 am to provider Renato Battles, PA-C, who verbally acknowledged these results. Electronically Signed   By: Orvan Falconer M.D.   On: 05/19/2023 09:22    Labs:  CBC: Recent Labs    05/30/23 0001 05/31/23 0423 06/01/23 0203 06/02/23 0515  WBC 14.6* 13.1* 14.3* 16.3*  HGB 10.1* 10.1* 10.1* 9.5*  HCT 30.1* 31.0* 30.6* 28.8*  PLT 316 390 404* 398    COAGS: No results for input(s): "INR", "APTT" in the last 8760 hours.  BMP: Recent Labs    05/30/23 0001 05/31/23 0423  06/01/23 0203 06/02/23 0515  NA 135 139 140 138  K 3.7 3.7 3.8 3.7  CL 102 102 102 104  CO2 24 26 24 24   GLUCOSE 226* 171* 164* 169*  BUN 15 14 16 15   CALCIUM 8.1* 8.7* 8.9 8.4*  CREATININE 0.85 0.44 0.72 0.76  GFRNONAA >60 >60 >60 >60    LIVER FUNCTION TESTS: Recent Labs    05/21/23 0446 05/27/23 1243 05/30/23 0001 06/01/23 0203  BILITOT 0.3 0.6 0.4 0.4  AST 39 36 27 17  ALT 23 46* 21 18  ALKPHOS 52 69 53 62  PROT 6.6 8.0 5.5* 6.1*  ALBUMIN 3.7 4.0 2.5* 2.4*    TUMOR MARKERS: No results for input(s): "AFPTM", "CEA", "CA199", "CHROMGRNA" in the last 8760 hours.  Assessment and Plan: Intra-abdominal fluid collection POD 10 s/p ex lap evacuation of L hemorrhagic cyst with extensive lysis of adhesions and sigmoid tear requiring repair. Patient with persistent ileus, now with fevers and elevated WBC. CT Abdomen Pelvis obtained shows intra-abdominal/pelvic fluid collection.  IR consulted for aspiration vs. Drainage.  Case reviewed by Dr. Archer Asa who approves patient for procedure.  She has been NPO-- on TPN with NGT in place.  INR pending.  Last dose heparin was 8am.   Risks and benefits discussed with the patient including bleeding, infection, damage to adjacent structures, bowel perforation/fistula connection, and sepsis.  All of the patient's questions were answered, patient is agreeable to proceed. Consent signed and in chart.   Thank you for this interesting consult.  I  greatly enjoyed meeting Wrenly KYNNEDI ZWEIG and look forward to participating in their care.  A copy of this report was sent to the requesting provider on this date.  Electronically Signed: Hoyt Koch, PA 06/02/2023, 3:06 PM   I spent a total of 40 Minutes    in face to face in clinical consultation, greater than 50% of which was counseling/coordinating care for intr-abdominal fluid collection.

## 2023-06-02 NOTE — Progress Notes (Signed)
PHARMACY - TOTAL PARENTERAL NUTRITION CONSULT NOTE   Indication: Small bowel obstruction and possible ileus  Patient Measurements: Height: 4' 9.01" (144.8 cm) Weight: 83.1 kg (183 lb 4.8 oz) IBW/kg (Calculated) : 38.62 TPN AdjBW (KG): 47.1 Body mass index is 39.65 kg/m. Usual Weight: patient reports she is typically 193 lbs (87.7 kg)  Assessment: 41yo female who presented 7/27 following previous 7/20-25 where she underwent exlap with lysis of adhesions, serosa repair, evacuation of hemorrhagic ovarian cyst. Diet was advanced to normal over course of admission with emesis POD3-4 but overall tolerated with normal bowel sounds, BM and flatus prior to discharge 7/25. Pt began to experience abdominal distension and cramping 7/26 that progressed to vomiting 7/27 and has been unable to maintain PO intake. Underwent re-exlap 7/27, hernia reduction, Vicryl mesh underlay, fascial closure. Pharmacy is consulted to initiate TPN.   Glucose / Insulin: CBGs <180 on TPN , 18 units/24h SSI in addition to 10 units added to TPN Electrolytes: K 3.7, CoCa ~9.7, 8/1 Phos up at 3.3, Mg 2.1, all others WNL Renal: Scr 0.76, BUN WNL Hepatic: 8/1 LFTs stable , TG up at 331  Intake / Output; MIVF: LUQ drain 40 ml, UOP x6 occurrences documented, NG output: 1800 mL, +flatus  GI Imaging:  8/1 CT A/P: small bowel obstruction vs. post-op ileus, intraperitoneal collection in pelvis GI Surgeries / Procedures: None since initiation of TPN   Central access: PICC TPN start date: 7/29 PM  Nutritional Goals:  Goal rate TPN 105 ml/hr to provide 2356 kcal and 151 g protein (100% of estimated needs)  RD Assessment: Estimated Needs Total Energy Estimated Needs: 2200-2400 Total Protein Estimated Needs: 125-145 grams Total Fluid Estimated Needs: >2.2 L  Current Nutrition:  NPO  Plan:  Continue TPN at goal rate of 12ml/hr (to provide ~100% of estimated needs) Electrolytes in TPN: Na 50 mEq/L, K increased to 54 mEq/L,  Ca 10 mEq/L, Mg 5 mEq/L, and Phos 25 mmol/L. Cl:Ac 1:1 Add famotidine 20mg  to TPN Increase regular insulin to 15 units added to TPN, continue Moderate q4h SSI and adjust as needed Give KCl x 3 in addition to TPN Monitor TPN labs daily until stable, then every Mon/Thurs  Thank you for allowing pharmacy to be a part of this patient's care.  Wilburn Cornelia, PharmD, BCPS Clinical Pharmacist 06/02/2023 11:21 AM   Please refer to [facility] for pharmacy phone number

## 2023-06-02 NOTE — Progress Notes (Signed)
PROGRESS NOTE    RODERICK SWEEZY  UEA:[id number] DOB: 15-May-1981 DOA: 05/27/2023 PCP: Deatra James, MD   Brief Narrative:  42 yo F PMH DM on glyburide, depression, who is POD 7 (Op date 7/20) ex lap evac of L hemorrhagic ovarian cyst, and extensive intraabdominal LOA, sigmoid colon serosal tear repair. She remained in the hospital until dc 7/25. Prior to dc she had + BM and + flatus. On 7/27 admited to ICU with hypoglycemia, concern for ileus. S/p re-exploration, hernia reduction, Vicryl mesh underlay and fascial closure.   Assessment & Plan:   Principal Problem:   Ileus (HCC) Active Problems:   Hypoglycemia   Intestinal obstruction (HCC)   Malnutrition of moderate degree  DM2, hypoglycemia -most likely poor intake + vomiting + taking home sulfonylurea, initially on dextrose drip and now on TPN, slightly hyperglycemic.  Blood sugar managed by insulin via TPN.    Hernia with bowel obstruction, fascial dehiscence/leukocytosis/low-grade fever -- s/p ex lap hernia reduction, fascia closure (7/27)  Recent ex lap evac of hemorrhagic ovarian cyst, extensive LOA and sigmoid colon serosal repait (7/20).  TPN for nutrition. last temperature spike of 100.9 around 1 AM on 06/01/2023.  She is passing flatus but no bowel movement.  Abdominal pain has improved somewhat and nausea is improving as well. CT yesterday with intraperitoneal collection in pelvis - IR consult for possible drain placement per general surgery.   Acute hypoxic respiratory failure: Patient dropped her oxygen saturation and required 2 L of oxygen, chest x-ray suggestive of atelectasis but no edema or consolidation.  This is likely due to pain prohibiting her from taking deep breaths.  She is on room air and comfortable.  Once again encouraged her to use incentive spirometry more often.   AKI -resolved   Low phos -replace in TPN   Anemia Stable.  Monitor daily.  Sinus tachycardia/hypertension: Blood pressure stable but she  still is tachycardic but asymptomatic.  Continue IV scheduled Lopressor and as needed IV labetalol.  DVT prophylaxis: heparin injection 5,000 Units Start: 05/30/23 1800   Code Status: Full Code  Family Communication:  None present at bedside.  Plan of care discussed with patient in length and he/she verbalized understanding and agreed with it.  Status is: Inpatient Remains inpatient appropriate because: Postop ileus   Estimated body mass index is 39.65 kg/m as calculated from the following:   Height as of this encounter: 4' 9.01" (1.448 m).   Weight as of this encounter: 83.1 kg.    Nutritional Assessment: Body mass index is 39.65 kg/m.Marland Kitchen Seen by dietician.  I agree with the assessment and plan as outlined below: Nutrition Status: Nutrition Problem: Moderate Malnutrition Etiology: acute illness (bowel obstruction, nausea, vomiting) Signs/Symptoms: mild muscle depletion, energy intake < or equal to 50% for > or equal to 5 days Interventions: TPN  . Skin Assessment: I have examined the patient's skin and I agree with the wound assessment as performed by the wound care RN as outlined below:    Consultants:  Gynecology and general surgery  Procedures:  As above  Antimicrobials:  Anti-infectives (From admission, onward)    Start     Dose/Rate Route Frequency Ordered Stop   06/01/23 1400  piperacillin-tazobactam (ZOSYN) IVPB 3.375 g        3.375 g 12.5 mL/hr over 240 Minutes Intravenous Every 8 hours 06/01/23 1312     05/27/23 1545  ceFAZolin (ANCEF) IVPB 2g/100 mL premix       Placed in "And" Linked Group  2 g 200 mL/hr over 30 Minutes Intravenous On call to O.R. 05/27/23 1539 05/27/23 1655   05/27/23 1545  metroNIDAZOLE (FLAGYL) IVPB 500 mg       Placed in "And" Linked Group   500 mg 100 mL/hr over 60 Minutes Intravenous On call to O.R. 05/27/23 1539 05/27/23 1753   05/27/23 1545  metroNIDAZOLE (FLAGYL) 500 MG/100ML IVPB       Note to Pharmacy: Crissie Sickles:  cabinet override      05/27/23 1545 05/27/23 1653   05/27/23 1545  ceFAZolin (ANCEF) 2-4 GM/100ML-% IVPB       Note to Pharmacy: Crissie Sickles: cabinet override      05/27/23 1545 05/27/23 1651         Subjective: Seen and examined.  Overall looks and feels better.  Nausea is improving and so is abdominal pain.  Passing flatus but no bowel movement.  Objective: Vitals:   06/02/23 0500 06/02/23 0600 06/02/23 0750 06/02/23 1140  BP: (!) 141/98 (!) 145/91 (!) 135/93 139/83  Pulse: (!) 117 (!) 116 (!) 120 (!) 133  Resp: (!) 22  20 20   Temp: 99 F (37.2 C)  99.6 F (37.6 C) 99.9 F (37.7 C)  TempSrc:   Oral Oral  SpO2: 95% 94% 95% 95%  Weight:      Height:        Intake/Output Summary (Last 24 hours) at 06/02/2023 1231 Last data filed at 06/02/2023 1148 Gross per 24 hour  Intake 5232.35 ml  Output 2340 ml  Net 2892.35 ml   Filed Weights   05/30/23 1400 06/01/23 0443 06/02/23 0410  Weight: 83.2 kg 83 kg 83.1 kg    Examination:  General exam: Appears calm and comfortable  Respiratory system: Clear to auscultation. Respiratory effort normal. Cardiovascular system: S1 & S2 heard, RRR. No JVD, murmurs, rubs, gallops or clicks. No pedal edema. Gastrointestinal system: Abdomen is slightly distended but soft with generalized tenderness, has abdominal binder in place. No organomegaly or masses felt. Normal bowel sounds heard. Central nervous system: Alert and oriented. No focal neurological deficits. Extremities: Symmetric 5 x 5 power. Skin: No rashes, lesions or ulcers.  Psychiatry: Judgement and insight appear normal. Mood & affect appropriate.   Data Reviewed: I have personally reviewed following labs and imaging studies  CBC: Recent Labs  Lab 05/29/23 0532 05/30/23 0001 05/31/23 0423 06/01/23 0203 06/02/23 0515  WBC 17.5* 14.6* 13.1* 14.3* 16.3*  NEUTROABS  --   --   --   --  9.1*  HGB 10.3* 10.1* 10.1* 10.1* 9.5*  HCT 30.8* 30.1* 31.0* 30.6* 28.8*  MCV 86.5 85.8  85.2 84.5 83.7  PLT 317 316 390 404* 398   Basic Metabolic Panel: Recent Labs  Lab 05/27/23 1243 05/27/23 1758 05/28/23 0010 05/29/23 0532 05/30/23 0001 05/31/23 0423 06/01/23 0203 06/02/23 0515  NA 139   < > 138 137 135 139 140 138  K 2.9*   < > 3.6 3.6 3.7 3.7 3.8 3.7  CL 96*   < > 100 103 102 102 102 104  CO2 31   < > 28 27 24 26 24 24   GLUCOSE 30*   < > 141* 172* 226* 171* 164* 169*  BUN 5*   < > 7 24* 15 14 16 15   CREATININE 0.77   < > 0.73 1.49* 0.85 0.44 0.72 0.76  CALCIUM 9.6   < > 8.2* 7.7* 8.1* 8.7* 8.9 8.4*  MG 2.2  --  1.7 2.4 2.2  --  2.1  --   PHOS  --   --  5.2* 3.6 1.6*  --  3.3  --    < > = values in this interval not displayed.   GFR: Estimated Creatinine Clearance: 82.4 mL/min (by C-G formula based on SCr of 0.76 mg/dL). Liver Function Tests: Recent Labs  Lab 05/27/23 1243 05/30/23 0001 06/01/23 0203  AST 36 27 17  ALT 46* 21 18  ALKPHOS 69 53 62  BILITOT 0.6 0.4 0.4  PROT 8.0 5.5* 6.1*  ALBUMIN 4.0 2.5* 2.4*   Recent Labs  Lab 05/27/23 1243  LIPASE 38   No results for input(s): "AMMONIA" in the last 168 hours. Coagulation Profile: No results for input(s): "INR", "PROTIME" in the last 168 hours. Cardiac Enzymes: No results for input(s): "CKTOTAL", "CKMB", "CKMBINDEX", "TROPONINI" in the last 168 hours. BNP (last 3 results) No results for input(s): "PROBNP" in the last 8760 hours. HbA1C: No results for input(s): "HGBA1C" in the last 72 hours. CBG: Recent Labs  Lab 06/01/23 2012 06/02/23 0008 06/02/23 0409 06/02/23 0800 06/02/23 1059  GLUCAP 168* 179* 166* 173* 173*   Lipid Profile: Recent Labs    06/01/23 0203  TRIG 331*   Thyroid Function Tests: No results for input(s): "TSH", "T4TOTAL", "FREET4", "T3FREE", "THYROIDAB" in the last 72 hours. Anemia Panel: No results for input(s): "VITAMINB12", "FOLATE", "FERRITIN", "TIBC", "IRON", "RETICCTPCT" in the last 72 hours. Sepsis Labs: Recent Labs  Lab 06/01/23 0203  06/01/23 1134  PROCALCITON  --  0.76  LATICACIDVEN 1.2  --     Recent Results (from the past 240 hour(s))  MRSA Next Gen by PCR, Nasal     Status: None   Collection Time: 05/27/23  8:06 PM   Specimen: Nasal Mucosa; Nasal Swab  Result Value Ref Range Status   MRSA by PCR Next Gen NOT DETECTED NOT DETECTED Final    Comment: (NOTE) The GeneXpert MRSA Assay (FDA approved for NASAL specimens only), is one component of a comprehensive MRSA colonization surveillance program. It is not intended to diagnose MRSA infection nor to guide or monitor treatment for MRSA infections. Test performance is not FDA approved in patients less than 60 years old. Performed at [facility], [address], [place], [place] [address]   Culture, blood (Routine X 2) w Reflex to ID Panel     Status: None (Preliminary result)   Collection Time: 06/01/23  1:11 AM   Specimen: BLOOD  Result Value Ref Range Status   Specimen Description BLOOD SITE NOT SPECIFIED  Final   Special Requests   Final    BOTTLES DRAWN AEROBIC AND ANAEROBIC Blood Culture adequate volume   Culture   Final    NO GROWTH < 24 HOURS Performed at [facility], [address] [place], [place] [address]    Report Status PENDING  Incomplete  Culture, blood (Routine X 2) w Reflex to ID Panel     Status: None (Preliminary result)   Collection Time: 06/01/23 11:35 AM   Specimen: BLOOD LEFT ARM  Result Value Ref Range Status   Specimen Description BLOOD LEFT ARM  Final   Special Requests   Final    BOTTLES DRAWN AEROBIC AND ANAEROBIC Blood Culture adequate volume   Culture   Final    NO GROWTH < 24 HOURS Performed at [facility], [address], [place], [place] [address]    Report Status PENDING  Incomplete     Radiology Studies: CT CHEST ABDOMEN  PELVIS W CONTRAST  Result Date: 06/01/2023 CLINICAL DATA:  Abdominal pain, post-op EXAM: CT CHEST, ABDOMEN, AND PELVIS WITH CONTRAST TECHNIQUE: Multidetector CT imaging of the  chest, abdomen and pelvis was performed following the standard protocol during bolus administration of intravenous contrast. RADIATION DOSE REDUCTION: This exam was performed according to the departmental dose-optimization program which includes automated exposure control, adjustment of the mA and/or kV according to patient size and/or use of iterative reconstruction technique. CONTRAST:  75mL OMNIPAQUE IOHEXOL 350 MG/ML SOLN COMPARISON:  May 27, 2023. April 21, 2015 FINDINGS: CT CHEST FINDINGS Cardiovascular: Heart is normal in size. No pericardial effusion. RIGHT upper extremity PICC tip terminates at the superior cavoatrial junction. Thoracic aorta is normal in course and caliber. Mediastinum/Nodes: Visualized thyroid is unremarkable. No axillary or mediastinal adenopathy. Lungs/Pleura: No pleural effusion or pneumothorax. Moderate bibasilar predominant platelike enhancing consolidative opacities most consistent with atelectasis. Scattered biapical centrilobular ground-glass opacities. Musculoskeletal: No chest wall mass or suspicious bone lesions identified. CT ABDOMEN PELVIS FINDINGS Hepatobiliary: No focal liver abnormality is seen. No gallstones, gallbladder wall thickening, or biliary dilatation. Pancreas: Unremarkable. No pancreatic ductal dilatation or surrounding inflammatory changes. Spleen: Normal in size without focal abnormality. Adrenals/Urinary Tract: Adrenal glands are unremarkable. Kidneys enhance symmetrically. No hydronephrosis. No obstructing nephrolithiasis. Small volume intraluminal air within the otherwise unremarkable bladder. Stomach/Bowel: Enteric tube terminates in the stomach. There are multiple dilated loops of small bowel in the upper and mid abdomen. Ileum is decompressed. There is a possible transition point in the RIGHT lower quadrant (series 6, image 77, series 7, image 77; series 3, image 80). This area is just superior to the postsurgical changes. Several prominent areas of  enhancement in the small bowel, favored reactive. Appendix is normal. Contrast delineates the proximal small bowel. Vascular/Lymphatic: No significant vascular findings are present. No enlarged abdominal or pelvic lymph nodes. Reproductive: Status post hysterectomy. No adnexal masses. Other: Patient is status post surgical repair of an inferior ventral hernia of the abdominal wall. Surgical drain is in the anterior subcutaneous soft tissues. There is a small volume subcutaneous air with adjacent fat stranding in the subcutaneous tissues. Mild expansion of the abdominal musculature at this level, likely postsurgical. No discrete hematoma. Deep to this there is a rim enhancing fluid collection that is intraperitoneal in location. This measures approximately 7.4 x 3.3 by 5.3 cm. This layers superior to the bladder Musculoskeletal: No acute or significant osseous findings. Transitional anatomy with bilateral assimilation joints at L5-S1. Unchanged sclerotic area of L1 since at least 2016, consistent with a benign fibro-osseous lesion. IMPRESSION: 1. There are multiple dilated loops of small bowel in the upper and mid abdomen with a possible transition point in the RIGHT lower quadrant. Findings are may reflect small bowel obstruction versus postsurgical ileus. Recommend serial abdominal radiographs to assess for progression of enteric contrast. 2. Patient is status post surgical repair of an inferior ventral hernia of the abdominal wall. There is a rim enhancing fluid collection deep to the surgical site measuring up to 7.4 cm. Differential considerations include postsurgical seroma versus abscess. Recommend correlation with clinical symptomatology 3. Small volume intraluminal air within the otherwise unremarkable bladder. This is favored to be secondary to recent instrumentation. 4. Moderate bibasilar predominant atelectasis. 5. Scattered biapical centrilobular ground-glass opacities could reflect aspiration, infection  or atelectasis. Electronically Signed   By: Meda Klinefelter M.D.   On: 06/01/2023 16:25   DG CHEST PORT 1 VIEW  Result Date: 06/01/2023 CLINICAL DATA:  Fever, shortness of breath  EXAM: PORTABLE CHEST 1 VIEW COMPARISON:  None Available. FINDINGS: Transverse diameter of heart is increased. There are no signs of alveolar pulmonary edema. Low lung volumes. Linear densities are seen in lower lung fields. There is no focal consolidation. There is no pleural effusion or pneumothorax. Tip of right PICC line is seen at the junction of superior vena cava and right atrium. Tip of NG tube is seen in the stomach. Side port in the NG tube is noted at the gastroesophageal junction. IMPRESSION: Linear densities are seen in the lower lung fields suggesting subsegmental atelectasis. There are no signs of pulmonary edema or focal pulmonary consolidation. Side-port in NG tube is noted at the gastroesophageal junction. NG tube could be advanced 5-10 cm to place the side port within the stomach. Electronically Signed   By: Ernie Avena M.D.   On: 06/01/2023 09:25    Scheduled Meds:  Chlorhexidine Gluconate Cloth  6 each Topical Daily   heparin injection (subcutaneous)  5,000 Units Subcutaneous Q8H   insulin aspart  0-15 Units Subcutaneous Q4H   metoprolol tartrate  5 mg Intravenous Q6H   sodium chloride flush  10-40 mL Intracatheter Q12H   Continuous Infusions:  sodium chloride 10 mL/hr (05/30/23 1552)   acetaminophen Stopped (06/02/23 0757)   piperacillin-tazobactam (ZOSYN)  IV 3.375 g (06/02/23 0448)   potassium chloride 10 mEq (06/02/23 1218)   TPN ADULT (ION) 105 mL/hr at 06/02/23 1045   TPN ADULT (ION)       LOS: 6 days   Hughie Closs, MD [facility]  06/02/2023, 12:31 PM   *Please note that this is a verbal dictation therefore any spelling or grammatical errors are due to the "[employer]agon [employer] One" system interpretation.  Please page via Amion and do not message via secure chat for urgent  patient care matters. Secure chat can be used for non urgent patient care matters.  How to contact the [facility] Attending or Consulting provider 7A - 7P or covering provider during after hours 7P -7A, for this patient?  Check the care team in [facility] and look for a) attending/consulting [facility] provider listed and b) the [facility] team listed. Page or secure chat 7A-7P. Log into [email or web address] and use [place]'s universal password to access. If you do not have the password, please contact the hospital operator. Locate the [facility] provider you are looking for under [facility] and page to a number that you can be directly reached. If you still have difficulty reaching the provider, please page the [facility] (Director on Call) for the Hospitalists listed on amion for assistance.

## 2023-06-03 DIAGNOSIS — K567 Ileus, unspecified: Secondary | ICD-10-CM | POA: Diagnosis not present

## 2023-06-03 LAB — GLUCOSE, CAPILLARY
Glucose-Capillary: 186 mg/dL — ABNORMAL HIGH (ref 70–99)
Glucose-Capillary: 168 mg/dL — ABNORMAL HIGH (ref 70–99)
Glucose-Capillary: 186 mg/dL — ABNORMAL HIGH (ref 70–99)

## 2023-06-03 MED ORDER — METHOCARBAMOL 1000 MG/10ML IJ SOLN
500.0000 mg | Freq: Four times a day (QID) | INTRAVENOUS | Status: DC
  Administered 2023-06-03 – 2023-06-04 (×3): 500 mg via INTRAVENOUS
  Filled 2023-06-03 (×2): qty 5
  Filled 2023-06-03 (×2): qty 500
  Filled 2023-06-03 (×3): qty 5
  Filled 2023-06-03: qty 500

## 2023-06-03 MED ORDER — ACETAMINOPHEN 500 MG PO TABS
1000.0000 mg | ORAL_TABLET | Freq: Four times a day (QID) | ORAL | Status: DC
  Administered 2023-06-03 – 2023-06-05 (×6): 1000 mg via ORAL
  Filled 2023-06-03 (×7): qty 2

## 2023-06-03 MED ORDER — TRAVASOL 10 % IV SOLN
INTRAVENOUS | Status: AC
  Filled 2023-06-03: qty 1322.4

## 2023-06-03 MED ORDER — OXYCODONE HCL 5 MG PO TABS
5.0000 mg | ORAL_TABLET | ORAL | Status: DC | PRN
  Administered 2023-06-03 – 2023-06-05 (×5): 10 mg via ORAL
  Filled 2023-06-03 (×5): qty 2

## 2023-06-03 MED ORDER — ENOXAPARIN SODIUM 40 MG/0.4ML IJ SOSY
40.0000 mg | PREFILLED_SYRINGE | Freq: Every day | INTRAMUSCULAR | Status: DC
  Administered 2023-06-03 – 2023-06-09 (×7): 40 mg via SUBCUTANEOUS
  Filled 2023-06-03 (×7): qty 0.4

## 2023-06-03 MED ORDER — INSULIN ASPART 100 UNIT/ML IJ SOLN
0.0000 [IU] | Freq: Four times a day (QID) | INTRAMUSCULAR | Status: DC
  Administered 2023-06-03 – 2023-06-04 (×3): 3 [IU] via SUBCUTANEOUS

## 2023-06-03 MED ORDER — ACETAMINOPHEN 10 MG/ML IV SOLN
1000.0000 mg | Freq: Three times a day (TID) | INTRAVENOUS | Status: DC | PRN
  Administered 2023-06-03: 1000 mg via INTRAVENOUS
  Filled 2023-06-03: qty 100

## 2023-06-03 NOTE — Progress Notes (Signed)
[facility] Progress Note  7 Days Post-Op  Subjective: CC-  Up in chair. NG accidentally out. Passing gas and has had 3 Bms already  this morning. Denies worsening pain or nausea since NG coming out. IR placed new drain yesterday.  Objective: Vital signs in last 24 hours: Temp:  [98.6 F (37 C)-101.2 F (38.4 C)] 99.5 F (37.5 C) (08/03 0827) Pulse Rate:  [111-128] 115 (08/03 0340) Resp:  [16-30] 18 (08/03 0827) BP: (132-155)/(81-100) 135/90 (08/03 0827) SpO2:  [96 %-100 %] 97 % (08/03 0827) Weight:  [81.2 kg] 81.2 kg (08/03 0717) Last BM Date : 05/19/23 (PTA per previous document)  Intake/Output from previous day: 08/02 0701 - 08/03 0700 In: 1949.7 [P.O.:50; I.V.:1549.7; IV Piggyback:350] Out: 3437.5 [Urine:1700; Emesis/NG output:1700; Drains:37.5] Intake/Output this shift: No intake/output data recorded.  PE: General: Alert, NAD Heart: sinus tachy Lungs: Respiratory effort nonlabored on room air Abd: appropriately ttp, drains with cloudy/SS fluid, incision with faint erythema and scant purulent drainage from distal aspect, abdominal binder reapplied  Lab Results:  Recent Labs    06/02/23 0515 06/03/23 0541  WBC 16.3* 20.0*  HGB 9.5* 9.5*  HCT 28.8* 28.0*  PLT 398 395   BMET Recent Labs    06/02/23 0515 06/03/23 0541  NA 138 136  K 3.7 4.0  CL 104 102  CO2 24 24  GLUCOSE 169* 171*  BUN 15 16  CREATININE 0.76 0.66  CALCIUM 8.4* 8.4*   PT/INR Recent Labs    06/02/23 1748  LABPROT 15.5*  INR 1.2   CMP     Component Value Date/Time   NA 136 06/03/2023 0541   K 4.0 06/03/2023 0541   CL 102 06/03/2023 0541   CO2 24 06/03/2023 0541   GLUCOSE 171 (H) 06/03/2023 0541   BUN 16 06/03/2023 0541   CREATININE 0.66 06/03/2023 0541   CALCIUM 8.4 (L) 06/03/2023 0541   PROT 6.1 (L) 06/01/2023 0203   ALBUMIN 2.4 (L) 06/01/2023 0203   AST 17 06/01/2023 0203   ALT 18 06/01/2023 0203   ALKPHOS 62 06/01/2023 0203   BILITOT 0.4 06/01/2023 0203    GFRNONAA >60 06/03/2023 0541   GFRAA >60 05/31/2018 1753   Lipase     Component Value Date/Time   LIPASE 38 05/27/2023 1243       Studies/Results: CT GUIDED PERITONEAL/RETROPERITONEAL FLUID DRAIN BY PERC CATH  Result Date: 06/03/2023 INDICATION: 42 year old female with postoperative ileus and peripherally enhancing fluid collection in the low abdomen concerning for possible abscess. She presents for CT-guided drain placement. EXAM: CT-guided drain placement MEDICATIONS: The patient is currently admitted to the hospital and receiving intravenous antibiotics. The antibiotics were administered within an appropriate time frame prior to the initiation of the procedure. ANESTHESIA/SEDATION: Moderate (conscious) sedation was employed during this procedure. A total of Versed 2 mg and Fentanyl 75 mcg was administered intravenously by the radiology nurse. Total intra-service moderate Sedation Time: 12 minutes. The patient's level of consciousness and vital signs were monitored continuously by radiology nursing throughout the procedure under my direct supervision. COMPLICATIONS: None immediate. PROCEDURE: Informed written consent was obtained from the patient after a thorough discussion of the procedural risks, benefits and alternatives. All questions were addressed. Maximal Sterile Barrier Technique was utilized including caps, mask, sterile gowns, sterile gloves, sterile drape, hand hygiene and skin antiseptic. A timeout was performed prior to the initiation of the procedure. A planning axial CT scan was performed. The fluid collection in the low abdomen was identified. A suitable skin  entry site was selected and marked. Local anesthesia was attained by infiltration with 1% lidocaine. A small dermatotomy was made. Under intermittent CT guidance, an 18 gauge trocar needle was carefully advanced into the fluid collection. Care was taken to avoid the inferior epigastric vessels. A wire was then advanced and  coiled in the fluid collection. The percutaneous tract was dilated to 12 [place]. A Cook 12 [place] all-purpose drainage catheter was advanced over the wire and formed. Aspiration yields turbid peach colored fluid. A sample was sent for Gram stain and culture. Approximately 15 mL was aspirated. The drainage catheter was lavaged and connected to JP bulb suction. The catheter was then secured to the skin with an adhesive fixation device. Post placement CT imaging demonstrates a well-positioned drainage catheter in the collapsed fluid collection. IMPRESSION: Successful placement of 12 French percutaneous drain into the abdominal fluid collection. Approximately 15 mL of turbid peach colored fluid was evacuated. A sample was sent for Gram stain and culture. Electronically Signed   By: Malachy Moan M.D.   On: 06/03/2023 06:44   CT CHEST ABDOMEN PELVIS W CONTRAST  Result Date: 06/01/2023 CLINICAL DATA:  Abdominal pain, post-op EXAM: CT CHEST, ABDOMEN, AND PELVIS WITH CONTRAST TECHNIQUE: Multidetector CT imaging of the chest, abdomen and pelvis was performed following the standard protocol during bolus administration of intravenous contrast. RADIATION DOSE REDUCTION: This exam was performed according to the departmental dose-optimization program which includes automated exposure control, adjustment of the mA and/or kV according to patient size and/or use of iterative reconstruction technique. CONTRAST:  75mL OMNIPAQUE IOHEXOL 350 MG/ML SOLN COMPARISON:  May 27, 2023. April 21, 2015 FINDINGS: CT CHEST FINDINGS Cardiovascular: Heart is normal in size. No pericardial effusion. RIGHT upper extremity PICC tip terminates at the superior cavoatrial junction. Thoracic aorta is normal in course and caliber. Mediastinum/Nodes: Visualized thyroid is unremarkable. No axillary or mediastinal adenopathy. Lungs/Pleura: No pleural effusion or pneumothorax. Moderate bibasilar predominant platelike enhancing consolidative opacities  most consistent with atelectasis. Scattered biapical centrilobular ground-glass opacities. Musculoskeletal: No chest wall mass or suspicious bone lesions identified. CT ABDOMEN PELVIS FINDINGS Hepatobiliary: No focal liver abnormality is seen. No gallstones, gallbladder wall thickening, or biliary dilatation. Pancreas: Unremarkable. No pancreatic ductal dilatation or surrounding inflammatory changes. Spleen: Normal in size without focal abnormality. Adrenals/Urinary Tract: Adrenal glands are unremarkable. Kidneys enhance symmetrically. No hydronephrosis. No obstructing nephrolithiasis. Small volume intraluminal air within the otherwise unremarkable bladder. Stomach/Bowel: Enteric tube terminates in the stomach. There are multiple dilated loops of small bowel in the upper and mid abdomen. Ileum is decompressed. There is a possible transition point in the RIGHT lower quadrant (series 6, image 77, series 7, image 77; series 3, image 80). This area is just superior to the postsurgical changes. Several prominent areas of enhancement in the small bowel, favored reactive. Appendix is normal. Contrast delineates the proximal small bowel. Vascular/Lymphatic: No significant vascular findings are present. No enlarged abdominal or pelvic lymph nodes. Reproductive: Status post hysterectomy. No adnexal masses. Other: Patient is status post surgical repair of an inferior ventral hernia of the abdominal wall. Surgical drain is in the anterior subcutaneous soft tissues. There is a small volume subcutaneous air with adjacent fat stranding in the subcutaneous tissues. Mild expansion of the abdominal musculature at this level, likely postsurgical. No discrete hematoma. Deep to this there is a rim enhancing fluid collection that is intraperitoneal in location. This measures approximately 7.4 x 3.3 by 5.3 cm. This layers superior to the bladder Musculoskeletal: No acute or  significant osseous findings. Transitional anatomy with bilateral  assimilation joints at L5-S1. Unchanged sclerotic area of L1 since at least 2016, consistent with a benign fibro-osseous lesion. IMPRESSION: 1. There are multiple dilated loops of small bowel in the upper and mid abdomen with a possible transition point in the RIGHT lower quadrant. Findings are may reflect small bowel obstruction versus postsurgical ileus. Recommend serial abdominal radiographs to assess for progression of enteric contrast. 2. Patient is status post surgical repair of an inferior ventral hernia of the abdominal wall. There is a rim enhancing fluid collection deep to the surgical site measuring up to 7.4 cm. Differential considerations include postsurgical seroma versus abscess. Recommend correlation with clinical symptomatology 3. Small volume intraluminal air within the otherwise unremarkable bladder. This is favored to be secondary to recent instrumentation. 4. Moderate bibasilar predominant atelectasis. 5. Scattered biapical centrilobular ground-glass opacities could reflect aspiration, infection or atelectasis. Electronically Signed   By: Meda Klinefelter M.D.   On: 06/01/2023 16:25    Anti-infectives: Anti-infectives (From admission, onward)    Start     Dose/Rate Route Frequency Ordered Stop   06/01/23 1400  piperacillin-tazobactam (ZOSYN) IVPB 3.375 g        3.375 g 12.5 mL/hr over 240 Minutes Intravenous Every 8 hours 06/01/23 1312     05/27/23 1545  ceFAZolin (ANCEF) IVPB 2g/100 mL premix       Placed in "And" Linked Group   2 g 200 mL/hr over 30 Minutes Intravenous On call to O.R. 05/27/23 1539 05/27/23 1655   05/27/23 1545  metroNIDAZOLE (FLAGYL) IVPB 500 mg       Placed in "And" Linked Group   500 mg 100 mL/hr over 60 Minutes Intravenous On call to O.R. 05/27/23 1539 05/27/23 1753   05/27/23 1545  metroNIDAZOLE (FLAGYL) 500 MG/100ML IVPB       Note to Pharmacy: Crissie Sickles: cabinet override      05/27/23 1545 05/27/23 1653   05/27/23 1545  ceFAZolin (ANCEF) 2-4  GM/100ML-% IVPB       Note to Pharmacy: Crissie Sickles: cabinet override      05/27/23 1545 05/27/23 1651        Assessment/Plan S/p  ex-lap with extensive LOA, evacuation of hemorrhagic ovarian cyst and serosal repair of colon 05/20/23  Fascial dehiscence of Pfannenstiel incision and small bowel herniation with obstruction  POD#7 s/p re-exploration, hernia reduction, vicryl mesh underlay and fascial closure  7/27 - ileus slowly improving. NG fell out. Ok to leave out and start clear liquids. Continue TPN until reliably tolerating diet - CT 8/1 with intraperitoneal collection in pelvis - s/p IR drain 8/2 with peach colored fluid evacuated. Culture pending. Continue zosyn - binder and continue drain in SQ, monitoring incision closely (may need to take a staple out tomorrow if not properly draining) - mobilize as able   FEN: TPN, CLD VTE: lovenox ID: [email or web address]   - per primary attending -  T2DM AKI on CKD   LOS: 7 days    Franne Forts, [facility] 06/03/2023, 11:43 AM Please see Amion for pager number during day hours 7:00am-4:30pm

## 2023-06-03 NOTE — Progress Notes (Signed)
[facility] OB/GYN Attending Progress Note  ADMISSION DIAGNOSIS:   Principal Problem:   Ileus (HCC) Active Problems:   Hypoglycemia   Intestinal obstruction (HCC)   Malnutrition of moderate degree   Subjective  Pt on bedside commode this am- states she has had multiple BMs since early this am.  Notes feeling better this am.  Passing flatus.   Still noting fever/chills overnight.   Objective  VITALS:  height is 4' 9.01" (1.448 m) and weight is 81.2 kg. Her oral temperature is 99.9 F (37.7 C). Her blood pressure is 143/99 (abnormal) and her pulse is 112 (abnormal). Her respiration is 18 and oxygen saturation is 100%.    EXAMINATION: CONSTITUTIONAL: Well-developed, well-nourished female in no acute distress.  SKIN: Skin is warm and dry. No rash noted. Slightly diaphoretic. NEUROLOGIC: Alert and oriented to person, place, and time. No cranial nerve deficit noted. PSYCHIATRIC: Normal mood and affect. Normal behavior. Normal judgment and thought content. CARDIOVASCULAR: tachycardia noted RESPIRATORY: Effort and breath sounds normal, no problems with respiration noted Abd: deferred-pt declined exam Ext: no edema, no calf tenderness  Laboratory Reports: Results for orders placed or performed during the hospital encounter of 05/27/23 (from the past 72 hour(s))  Glucose, capillary     Status: Abnormal   Collection Time: 05/31/23  4:47 PM  Result Value Ref Range   Glucose-Capillary 170 (H) 70 - 99 mg/dL    Comment: Glucose reference range applies only to samples taken after fasting for at least 8 hours.  Glucose, capillary     Status: Abnormal   Collection Time: 05/31/23  8:50 PM  Result Value Ref Range   Glucose-Capillary 182 (H) 70 - 99 mg/dL    Comment: Glucose reference range applies only to samples taken after fasting for at least 8 hours.  Glucose, capillary     Status: Abnormal   Collection Time: 05/31/23 11:50 PM  Result Value Ref Range    Glucose-Capillary 189 (H) 70 - 99 mg/dL    Comment: Glucose reference range applies only to samples taken after fasting for at least 8 hours.  Culture, blood (Routine X 2) w Reflex to ID Panel     Status: None (Preliminary result)   Collection Time: 06/01/23  1:11 AM   Specimen: BLOOD  Result Value Ref Range   Specimen Description BLOOD SITE NOT SPECIFIED    Special Requests      BOTTLES DRAWN AEROBIC AND ANAEROBIC Blood Culture adequate volume   Culture      NO GROWTH 2 DAYS Performed at [facility], [address] [place], [place] [address]    Report Status PENDING   Comprehensive metabolic panel     Status: Abnormal   Collection Time: 06/01/23  2:03 AM  Result Value Ref Range   Sodium 140 135 - 145 mmol/L   Potassium 3.8 3.5 - 5.1 mmol/L   Chloride 102 98 - 111 mmol/L   CO2 24 22 - 32 mmol/L   Glucose, Bld 164 (H) 70 - 99 mg/dL    Comment: Glucose reference range applies only to samples taken after fasting for at least 8 hours.   BUN 16 6 - 20 mg/dL   Creatinine, Ser 1.91 0.44 - 1.00 mg/dL   Calcium 8.9 8.9 - 47.8 mg/dL   Total Protein 6.1 (L) 6.5 - 8.1 g/dL   Albumin 2.4 (L) 3.5 - 5.0 g/dL   AST 17 15 - 41 U/L  ALT 18 0 - 44 U/L   Alkaline Phosphatase 62 38 - 126 U/L   Total Bilirubin 0.4 0.3 - 1.2 mg/dL   GFR, Estimated >16 >10 mL/min    Comment: (NOTE) Calculated using the CKD-EPI Creatinine Equation (2021)    Anion gap 14 5 - 15    Comment: Performed at [facility], [address], [place], [place] [address]  Magnesium     Status: None   Collection Time: 06/01/23  2:03 AM  Result Value Ref Range   Magnesium 2.1 1.7 - 2.4 mg/dL    Comment: Performed at [facility], [address] [place], [place] [address]  Phosphorus     Status: None   Collection Time: 06/01/23  2:03 AM  Result Value Ref Range   Phosphorus 3.3 2.5 - 4.6 mg/dL    Comment: Performed at [facility], [address] [place], [place] [address]  Triglycerides     Status:  Abnormal   Collection Time: 06/01/23  2:03 AM  Result Value Ref Range   Triglycerides 331 (H) <150 mg/dL    Comment: Performed at [facility], [address], [place], [place] [address]  Lactic acid, plasma     Status: None   Collection Time: 06/01/23  2:03 AM  Result Value Ref Range   Lactic Acid, Venous 1.2 0.5 - 1.9 mmol/L    Comment: Performed at [facility], [address] [place], [place] [address]  CBC     Status: Abnormal   Collection Time: 06/01/23  2:03 AM  Result Value Ref Range   WBC 14.3 (H) 4.0 - 10.5 K/uL   RBC 3.62 (L) 3.87 - 5.11 MIL/uL   Hemoglobin 10.1 (L) 12.0 - 15.0 g/dL   HCT 65.7 (L) 84.6 - 96.2 %   MCV 84.5 80.0 - 100.0 fL   MCH 27.9 26.0 - 34.0 pg   MCHC 33.0 30.0 - 36.0 g/dL   RDW 95.2 84.1 - 32.4 %   Platelets 404 (H) 150 - 400 K/uL   nRBC 0.0 0.0 - 0.2 %    Comment: Performed at [facility], [address] [place], [place] [address]  Glucose, capillary     Status: Abnormal   Collection Time: 06/01/23  3:51 AM  Result Value Ref Range   Glucose-Capillary 189 (H) 70 - 99 mg/dL    Comment: Glucose reference range applies only to samples taken after fasting for at least 8 hours.  Glucose, capillary     Status: Abnormal   Collection Time: 06/01/23  7:13 AM  Result Value Ref Range   Glucose-Capillary 188 (H) 70 - 99 mg/dL    Comment: Glucose reference range applies only to samples taken after fasting for at least 8 hours.  Glucose, capillary     Status: Abnormal   Collection Time: 06/01/23 11:22 AM  Result Value Ref Range   Glucose-Capillary 162 (H) 70 - 99 mg/dL    Comment: Glucose reference range applies only to samples taken after fasting for at least 8 hours.  Procalcitonin     Status: None   Collection Time: 06/01/23 11:34 AM  Result Value Ref Range   Procalcitonin 0.76 ng/mL    Comment:        Interpretation: PCT > 0.5 ng/mL and <= 2 ng/mL: Systemic infection (sepsis) is possible, but other conditions are known to  elevate PCT as well. (NOTE)       Sepsis PCT Algorithm  Lower Respiratory Tract                                      Infection PCT Algorithm    ----------------------------     ----------------------------         PCT < 0.25 ng/mL                PCT < 0.10 ng/mL          Strongly encourage             Strongly discourage   discontinuation of antibiotics    initiation of antibiotics    ----------------------------     -----------------------------       PCT 0.25 - 0.50 ng/mL            PCT 0.10 - 0.25 ng/mL               OR       >80% decrease in PCT            Discourage initiation of                                            antibiotics      Encourage discontinuation           of antibiotics    ----------------------------     -----------------------------         PCT >= 0.50 ng/mL              PCT 0.26 - 0.50 ng/mL                AND       <80% decrease in PCT             Encourage initiation of                                             antibiotics       Encourage continuation           of antibiotics    ----------------------------     -----------------------------        PCT >= 0.50 ng/mL                  PCT > 0.50 ng/mL               AND         increase in PCT                  Strongly encourage                                      initiation of antibiotics    Strongly encourage escalation           of antibiotics                                     -----------------------------  PCT <= 0.25 ng/mL                                                 OR                                        > 80% decrease in PCT                                      Discontinue / Do not initiate                                             antibiotics  Performed at [facility], [address], [place], [place] [address]   Culture, blood (Routine X 2) w Reflex to ID Panel     Status: None (Preliminary result)   Collection Time:  06/01/23 11:35 AM   Specimen: BLOOD LEFT ARM  Result Value Ref Range   Specimen Description BLOOD LEFT ARM    Special Requests      BOTTLES DRAWN AEROBIC AND ANAEROBIC Blood Culture adequate volume   Culture      NO GROWTH 2 DAYS Performed at [facility], [address] [place], [place] [address]    Report Status PENDING   Glucose, capillary     Status: Abnormal   Collection Time: 06/01/23  4:09 PM  Result Value Ref Range   Glucose-Capillary 178 (H) 70 - 99 mg/dL    Comment: Glucose reference range applies only to samples taken after fasting for at least 8 hours.  Glucose, capillary     Status: Abnormal   Collection Time: 06/01/23  8:12 PM  Result Value Ref Range   Glucose-Capillary 168 (H) 70 - 99 mg/dL    Comment: Glucose reference range applies only to samples taken after fasting for at least 8 hours.  Glucose, capillary     Status: Abnormal   Collection Time: 06/02/23 12:08 AM  Result Value Ref Range   Glucose-Capillary 179 (H) 70 - 99 mg/dL    Comment: Glucose reference range applies only to samples taken after fasting for at least 8 hours.   Comment 1 Notify RN    Comment 2 Document in Chart   Glucose, capillary     Status: Abnormal   Collection Time: 06/02/23  4:09 AM  Result Value Ref Range   Glucose-Capillary 166 (H) 70 - 99 mg/dL    Comment: Glucose reference range applies only to samples taken after fasting for at least 8 hours.   Comment 1 Notify RN    Comment 2 Document in Chart   CBC with Differential/Platelet     Status: Abnormal   Collection Time: 06/02/23  5:15 AM  Result Value Ref Range   WBC 16.3 (H) 4.0 - 10.5 K/uL   RBC 3.44 (L) 3.87 - 5.11 MIL/uL   Hemoglobin 9.5 (L) 12.0 - 15.0 g/dL   HCT 09.8 (L) 11.9 - 14.7 %   MCV 83.7 80.0 - 100.0 fL   MCH 27.6 26.0 - 34.0 pg  MCHC 33.0 30.0 - 36.0 g/dL   RDW 35.0 09.3 - 81.8 %   Platelets 398 150 - 400 K/uL   nRBC 0.0 0.0 - 0.2 %   Neutrophils Relative % 55 %   Neutro Abs 9.1 (H) 1.7 - 7.7 K/uL    Lymphocytes Relative 17 %   Lymphs Abs 2.8 0.7 - 4.0 K/uL   Monocytes Relative 15 %   Monocytes Absolute 2.4 (H) 0.1 - 1.0 K/uL   Eosinophils Relative 2 %   Eosinophils Absolute 0.3 0.0 - 0.5 K/uL   Basophils Relative 1 %   Basophils Absolute 0.1 0.0 - 0.1 K/uL   WBC Morphology DOHLE BODIES     Comment: Mild Left Shift (1-5% metas, occ myelo)   RBC Morphology MORPHOLOGY UNREMARKABLE    Smear Review Reviewed    Immature Granulocytes 10 %   Abs Immature Granulocytes 1.58 (H) 0.00 - 0.07 K/uL    Comment: Performed at [facility], [address], [place], [place] [address]  Basic metabolic panel     Status: Abnormal   Collection Time: 06/02/23  5:15 AM  Result Value Ref Range   Sodium 138 135 - 145 mmol/L   Potassium 3.7 3.5 - 5.1 mmol/L   Chloride 104 98 - 111 mmol/L   CO2 24 22 - 32 mmol/L   Glucose, Bld 169 (H) 70 - 99 mg/dL    Comment: Glucose reference range applies only to samples taken after fasting for at least 8 hours.   BUN 15 6 - 20 mg/dL   Creatinine, Ser 1.69 0.44 - 1.00 mg/dL   Calcium 8.4 (L) 8.9 - 10.3 mg/dL   GFR, Estimated >67 >89 mL/min    Comment: (NOTE) Calculated using the CKD-EPI Creatinine Equation (2021)    Anion gap 10 5 - 15    Comment: Performed at [facility], [address] [place], [place] [address]  Glucose, capillary     Status: Abnormal   Collection Time: 06/02/23  8:00 AM  Result Value Ref Range   Glucose-Capillary 173 (H) 70 - 99 mg/dL    Comment: Glucose reference range applies only to samples taken after fasting for at least 8 hours.  Glucose, capillary     Status: Abnormal   Collection Time: 06/02/23 10:59 AM  Result Value Ref Range   Glucose-Capillary 173 (H) 70 - 99 mg/dL    Comment: Glucose reference range applies only to samples taken after fasting for at least 8 hours.   Comment 1 Notify RN    Comment 2 Document in Chart   Glucose, capillary     Status: Abnormal   Collection Time: 06/02/23  5:30 PM  Result Value Ref  Range   Glucose-Capillary 182 (H) 70 - 99 mg/dL    Comment: Glucose reference range applies only to samples taken after fasting for at least 8 hours.   Comment 1 Notify RN    Comment 2 Document in Chart   Protime-INR     Status: Abnormal   Collection Time: 06/02/23  5:48 PM  Result Value Ref Range   Prothrombin Time 15.5 (H) 11.4 - 15.2 seconds   INR 1.2 0.8 - 1.2    Comment: (NOTE) INR goal varies based on device and disease states. Performed at [facility], [address] [place], [place] [address]   Glucose, capillary     Status: Abnormal   Collection Time: 06/02/23  8:13 PM  Result Value Ref Range   Glucose-Capillary  174 (H) 70 - 99 mg/dL    Comment: Glucose reference range applies only to samples taken after fasting for at least 8 hours.  Aerobic/Anaerobic Culture w Gram Stain (surgical/deep wound)     Status: None (Preliminary result)   Collection Time: 06/02/23  8:19 PM   Specimen: Abscess  Result Value Ref Range   Specimen Description ABSCESS ABDOMEN    Special Requests NONE    Gram Stain      FEW WBC PRESENT, PREDOMINANTLY PMN NO ORGANISMS SEEN    Culture      NO GROWTH < 24 HOURS Performed at [facility], [address] [place], [place] [address]    Report Status PENDING   Glucose, capillary     Status: Abnormal   Collection Time: 06/02/23 11:30 PM  Result Value Ref Range   Glucose-Capillary 165 (H) 70 - 99 mg/dL    Comment: Glucose reference range applies only to samples taken after fasting for at least 8 hours.  Glucose, capillary     Status: Abnormal   Collection Time: 06/03/23  3:45 AM  Result Value Ref Range   Glucose-Capillary 186 (H) 70 - 99 mg/dL    Comment: Glucose reference range applies only to samples taken after fasting for at least 8 hours.  Basic metabolic panel     Status: Abnormal   Collection Time: 06/03/23  5:41 AM  Result Value Ref Range   Sodium 136 135 - 145 mmol/L   Potassium 4.0 3.5 - 5.1 mmol/L   Chloride 102 98 -  111 mmol/L   CO2 24 22 - 32 mmol/L   Glucose, Bld 171 (H) 70 - 99 mg/dL    Comment: Glucose reference range applies only to samples taken after fasting for at least 8 hours.   BUN 16 6 - 20 mg/dL   Creatinine, Ser 4.01 0.44 - 1.00 mg/dL   Calcium 8.4 (L) 8.9 - 10.3 mg/dL   GFR, Estimated >02 >72 mL/min    Comment: (NOTE) Calculated using the CKD-EPI Creatinine Equation (2021)    Anion gap 10 5 - 15    Comment: Performed at [facility], [address] [place], [place] [address]  Magnesium     Status: None   Collection Time: 06/03/23  5:41 AM  Result Value Ref Range   Magnesium 2.1 1.7 - 2.4 mg/dL    Comment: Performed at [facility], [address] [place], [place] [address]  Phosphorus     Status: None   Collection Time: 06/03/23  5:41 AM  Result Value Ref Range   Phosphorus 3.8 2.5 - 4.6 mg/dL    Comment: Performed at [facility], [address] [place], [place] [address]  CBC with Differential/Platelet     Status: Abnormal   Collection Time: 06/03/23  5:41 AM  Result Value Ref Range   WBC 20.0 (H) 4.0 - 10.5 K/uL   RBC 3.39 (L) 3.87 - 5.11 MIL/uL   Hemoglobin 9.5 (L) 12.0 - 15.0 g/dL   HCT 63.8 (L) 75.6 - 43.3 %   MCV 82.6 80.0 - 100.0 fL   MCH 28.0 26.0 - 34.0 pg   MCHC 33.9 30.0 - 36.0 g/dL   RDW 29.5 18.8 - 41.6 %   Platelets 395 150 - 400 K/uL   nRBC 0.1 0.0 - 0.2 %   Neutrophils Relative % 69 %   Neutro Abs 13.8 (H) 1.7 - 7.7 K/uL   Lymphocytes Relative 17 %  Lymphs Abs 3.4 0.7 - 4.0 K/uL   Monocytes Relative 9 %   Monocytes Absolute 1.8 (H) 0.1 - 1.0 K/uL   Eosinophils Relative 3 %   Eosinophils Absolute 0.6 (H) 0.0 - 0.5 K/uL   Basophils Relative 2 %   Basophils Absolute 0.4 (H) 0.0 - 0.1 K/uL   nRBC 0 0 /100 WBC   Abs Immature Granulocytes 0.00 0.00 - 0.07 K/uL    Comment: Performed at [facility], [address], [place], [place] [address]  Glucose, capillary     Status: Abnormal   Collection Time: 06/03/23 12:05 PM  Result  Value Ref Range   Glucose-Capillary 168 (H) 70 - 99 mg/dL    Comment: Glucose reference range applies only to samples taken after fasting for at least 8 hours.     ASSESSMENT/PLAN: POD#7 s/p re-exploration, hernia reduction, vicryl mesh underlay and fascial closure Initial surgery 7/20- s/p  ex-lap with extensive LOA, evacuation of hemorrhagic ovarian cyst and serosal repair of colon 05/20/23  Fascial dehiscence of Pfannenstiel incision and small bowel herniation with obstruction   1) Fever -on zosyn -CT with small fluid collection- IR Placed drain on 8/2- cultures pending -CXR with moderate atelectasis- IS ordered and encouraged -Urine culture pending -specific source of potential infection remains unclear -leukocytosis noted, will continue to monitor  2) Postop ileus -NG out -pt now passing flatus and BM -advancing diet - management per general surgery  3) FEN, DM2 -management per IM team including insulin management  DVT Prophylaxis:  Heparin  Code Status: Full Disposition Plan: Hopeful that with BM today we may start to see improvement of pt status and will work to transition of PCA to oral pain meds only.  Myna Hidalgo, DO Attending Obstetrician & Gynecologist, [facility] for [employer], [facility]

## 2023-06-03 NOTE — Progress Notes (Signed)
PHARMACY - TOTAL PARENTERAL NUTRITION CONSULT NOTE   Indication: Prolonged ileus  Patient Measurements: Height: 4' 9.01" (144.8 cm) Weight: 81.2 kg (179 lb) IBW/kg (Calculated) : 38.62 TPN AdjBW (KG): 47.1 Body mass index is 38.72 kg/m. Usual Weight: patient reports she is typically 193 lbs (87.7 kg)  Assessment:  42yo female who presented 7/27 following previous admission 7/20-25 where she underwent exlap with lysis of adhesions, serosa repair, evacuation of hemorrhagic ovarian cyst. Diet was advanced to normal over course of admission with emesis POD 3-4 but overall tolerated with normal bowel sounds, BM and flatus prior to discharge 7/25. Pt began to experience abdominal distension and cramping 7/26 that progressed to vomiting 7/27 and has been unable to maintain PO intake. Underwent re-exlap 7/27, hernia reduction, Vicryl mesh underlay, fascial closure. Pharmacy is consulted to initiate TPN.   Glucose / Insulin: BG 165-186, used 18 units/24h SSI and 15 units in TPN Electrolytes: K 4 (received 30 mEq IV), CoCa 9.7, others WNL Renal: Scr 0.66, BUN WNL Hepatic: 8/1 alk phos/AST/ALT/Tbili wnl, TG 331, Albumin 2.4 Intake / Output; MIVF: UOP 0.9 ml/kg/hr, NGT , drain 37.5 ml, +flatus. Net +3.6L GI Imaging:  8/1 CT A/P: SBO vs. post-op ileus, intraperitoneal collection in pelvis GI Surgeries / Procedures:  7/27 ex lap, hernia reduction, Vicryl mesh underlay, fascial closure. 8/2 drain to abdominal fluid collection  Central access: PICC TPN start date: 7/29    Nutritional Goals:  Goal rate TPN 95 ml/hr to provide 2221 kcal and 132 g protein   RD Assessment: Estimated Needs Total Energy Estimated Needs: 2200-2400 Total Protein Estimated Needs: 125-145 grams Total Fluid Estimated Needs: >2.2 L  Current Nutrition:  NPO + TPN  Plan:  Continue TPN at goal rate of 95 ml/hr, provides 100% of estimated needs Electrolytes in TPN (small adjustments with rate change): increase Na  100 mEq/L, K 60 mEq/L (=136 mEq), Ca 10 mEq/L, Mg 6 mEq/L, Phos 25 mmol/L. Cl:Ac 1:1 Add famotidine 20mg  to TPN Increase regular insulin 20 units in TPN Decrease Moderate Q6hr SSI and adjust as needed  Monitor TPN labs daily until stable, then every Mon/Thurs F/u TG, adjust lipids as needed F/u NGT output, decrease K if decreases   Thank you for allowing pharmacy to be a part of this patient's care.  Alphia Moh, PharmD, BCPS, BCCP Clinical Pharmacist  Please check AMION for all [facility] phone numbers After 10:00 PM, call Main Pharmacy [phone]

## 2023-06-03 NOTE — Plan of Care (Signed)
  Problem: Education: Goal: Knowledge of the prescribed therapeutic regimen will improve Outcome: Progressing   Problem: Self-Concept: Goal: Communication of feelings regarding changes in body function or appearance will improve Outcome: Progressing   Problem: Skin Integrity: Goal: Demonstration of wound healing without infection will improve Outcome: Progressing   Problem: Education: Goal: Knowledge of General Education information will improve Description Including pain rating scale, medication(s)/side effects and non-pharmacologic comfort measures Outcome: Progressing

## 2023-06-03 NOTE — Plan of Care (Signed)
  Problem: Education: Goal: Knowledge of the prescribed therapeutic regimen will improve Outcome: Progressing Goal: Understanding of sexual limitations or changes related to disease process or condition will improve Outcome: Progressing Goal: Individualized Educational Video(s) Outcome: Progressing   Problem: Self-Concept: Goal: Communication of feelings regarding changes in body function or appearance will improve Outcome: Progressing   Problem: Skin Integrity: Goal: Demonstration of wound healing without infection will improve Outcome: Progressing   Problem: Education: Goal: Knowledge of General Education information will improve Description: Including pain rating scale, medication(s)/side effects and non-pharmacologic comfort measures Outcome: Progressing   Problem: Health Behavior/Discharge Planning: Goal: Ability to manage health-related needs will improve Outcome: Progressing   Problem: Clinical Measurements: Goal: Ability to maintain clinical measurements within normal limits will improve Outcome: Progressing Goal: Will remain free from infection Outcome: Progressing Goal: Diagnostic test results will improve Outcome: Progressing Goal: Respiratory complications will improve Outcome: Progressing Goal: Cardiovascular complication will be avoided Outcome: Progressing   Problem: Nutrition: Goal: Adequate nutrition will be maintained Outcome: Progressing   Problem: Activity: Goal: Risk for activity intolerance will decrease Outcome: Progressing   Problem: Nutrition: Goal: Adequate nutrition will be maintained Outcome: Progressing   Problem: Coping: Goal: Level of anxiety will decrease Outcome: Progressing   Problem: Elimination: Goal: Will not experience complications related to bowel motility Outcome: Progressing Goal: Will not experience complications related to urinary retention Outcome: Progressing   Problem: Pain Managment: Goal: General experience  of comfort will improve Outcome: Progressing   Problem: Safety: Goal: Ability to remain free from injury will improve Outcome: Progressing   Problem: Skin Integrity: Goal: Risk for impaired skin integrity will decrease Outcome: Progressing

## 2023-06-03 NOTE — Progress Notes (Signed)
PROGRESS NOTE    Maria [name]  EXB:[id number] DOB: 02/21/1981 DOA: 05/27/2023 PCP: Deatra James, MD   Brief Narrative:  42 yo F PMH DM on glyburide, depression, who is POD 7 (Op date 7/20) ex lap evac of L hemorrhagic ovarian cyst, and extensive intraabdominal LOA, sigmoid colon serosal tear repair. She remained in the hospital until dc 7/25. Prior to dc she had + BM and + flatus. On 7/27 admited to ICU with hypoglycemia, concern for ileus. S/p re-exploration, hernia reduction, Vicryl mesh underlay and fascial closure.   Assessment & Plan:   Principal Problem:   Ileus (HCC) Active Problems:   Hypoglycemia   Intestinal obstruction (HCC)   Malnutrition of moderate degree  DM2, hypoglycemia -most likely poor intake + vomiting + taking home sulfonylurea, initially on dextrose drip and now on TPN, slightly hyperglycemic.  Blood sugar managed by insulin via TPN.    Hernia with bowel obstruction, fascial dehiscence/leukocytosis/low-grade fever -- s/p ex lap hernia reduction, fascia closure (7/27)  Recent ex lap evac of hemorrhagic ovarian cyst, extensive LOA and sigmoid colon serosal repait (7/20).  TPN for nutrition.  Had yet another episode of fever of 101.2 at 2 AM 06/03/2023.  She says that her abdominal pain is improving and reportedly she had 2 large bowel movements this morning.  Her NG tube was pulled out and patient does not want it back.  She is denying any nausea.  RN has reached out to general surgery about that.  CT 06/01/2023 shows intraperitoneal collection in pelvis - IR consulted and patient was placed intraperitoneal drain.  Has worsening leukocytosis as well.  She is on Zosyn, will wait for cultures.   Acute hypoxic respiratory failure: Patient dropped her oxygen saturation and required 2 L of oxygen, chest x-ray suggestive of atelectasis but no edema or consolidation.  This is likely due to pain prohibiting her from taking deep breaths.  She is on room air and comfortable.  Once  again encouraged her to use incentive spirometry more often.   AKI -resolved   Low phos -replace in TPN   Anemia Stable.  Monitor daily.  Sinus tachycardia/hypertension: Blood pressure stable but she still is tachycardic but asymptomatic.  Continue IV scheduled Lopressor and as needed IV labetalol.  DVT prophylaxis: enoxaparin (LOVENOX) injection 40 mg Start: 06/03/23 1000   Code Status: Full Code  Family Communication:  None present at bedside.  Plan of care discussed with patient in length and he/she verbalized understanding and agreed with it.  Status is: Inpatient Remains inpatient appropriate because: Postop ileus   Estimated body mass index is 38.72 kg/m as calculated from the following:   Height as of this encounter: 4' 9.01" (1.448 m).   Weight as of this encounter: 81.2 kg.    Nutritional Assessment: Body mass index is 38.72 kg/m.Marland Kitchen Seen by dietician.  I agree with the assessment and plan as outlined below: Nutrition Status: Nutrition Problem: Moderate Malnutrition Etiology: acute illness (bowel obstruction, nausea, vomiting) Signs/Symptoms: mild muscle depletion, energy intake < or equal to 50% for > or equal to 5 days Interventions: TPN  . Skin Assessment: I have examined the patient's skin and I agree with the wound assessment as performed by the wound care RN as outlined below:    Consultants:  Gynecology and general surgery  Procedures:  As above  Antimicrobials:  Anti-infectives (From admission, onward)    Start     Dose/Rate Route Frequency Ordered Stop   06/01/23 1400  piperacillin-tazobactam (ZOSYN)  IVPB 3.375 g        3.375 g 12.5 mL/hr over 240 Minutes Intravenous Every 8 hours 06/01/23 1312     05/27/23 1545  ceFAZolin (ANCEF) IVPB 2g/100 mL premix       Placed in "And" Linked Group   2 g 200 mL/hr over 30 Minutes Intravenous On call to O.R. 05/27/23 1539 05/27/23 1655   05/27/23 1545  metroNIDAZOLE (FLAGYL) IVPB 500 mg       Placed in  "And" Linked Group   500 mg 100 mL/hr over 60 Minutes Intravenous On call to O.R. 05/27/23 1539 05/27/23 1753   05/27/23 1545  metroNIDAZOLE (FLAGYL) 500 MG/100ML IVPB       Note to Pharmacy: Crissie Sickles: cabinet override      05/27/23 1545 05/27/23 1653   05/27/23 1545  ceFAZolin (ANCEF) 2-4 GM/100ML-% IVPB       Note to Pharmacy: Crissie Sickles: cabinet override      05/27/23 1545 05/27/23 1651         Subjective: Patient seen and examined.  Sitting in the chair.  Denies any abdominal pain or nausea.  Objective: Vitals:   06/03/23 0200 06/03/23 0340 06/03/23 0717 06/03/23 0827  BP:  (!) 135/93  (!) 135/90  Pulse:  (!) 115    Resp:  19  18  Temp: (!) 101.2 F (38.4 C) 98.6 F (37 C)  99.5 F (37.5 C)  TempSrc:  Oral  Oral  SpO2:  96%  97%  Weight:   81.2 kg   Height:        Intake/Output Summary (Last 24 hours) at 06/03/2023 1117 Last data filed at 06/03/2023 0900 Gross per 24 hour  Intake 1038.36 ml  Output 3437.5 ml  Net -2399.14 ml   Filed Weights   06/01/23 0443 06/02/23 0410 06/03/23 0717  Weight: 83 kg 83.1 kg 81.2 kg    Examination:  General exam: Appears very weak with chills. Respiratory system: Clear to auscultation. Respiratory effort normal. Cardiovascular system: S1 & S2 heard, RRR. No JVD, murmurs, rubs, gallops or clicks. No pedal edema. Gastrointestinal system: Abdomen is distended, soft with generalized tenderness and has abdominal binder in place. No organomegaly or masses felt. Normal bowel sounds heard. Central nervous system: Alert and oriented. No focal neurological deficits. Extremities: Symmetric 5 x 5 power. Skin: No rashes, lesions or ulcers.  Psychiatry: Judgement and insight appear normal. Mood & affect appropriate.   Data Reviewed: I have personally reviewed following labs and imaging studies  CBC: Recent Labs  Lab 05/30/23 0001 05/31/23 0423 06/01/23 0203 06/02/23 0515 06/03/23 0541  WBC 14.6* 13.1* 14.3* 16.3* 20.0*   NEUTROABS  --   --   --  9.1* 13.8*  HGB 10.1* 10.1* 10.1* 9.5* 9.5*  HCT 30.1* 31.0* 30.6* 28.8* 28.0*  MCV 85.8 85.2 84.5 83.7 82.6  PLT 316 390 404* 398 395   Basic Metabolic Panel: Recent Labs  Lab 05/28/23 0010 05/29/23 0532 05/30/23 0001 05/31/23 0423 06/01/23 0203 06/02/23 0515 06/03/23 0541  NA 138 137 135 139 140 138 136  K 3.6 3.6 3.7 3.7 3.8 3.7 4.0  CL 100 103 102 102 102 104 102  CO2 28 27 24 26 24 24 24   GLUCOSE 141* 172* 226* 171* 164* 169* 171*  BUN 7 24* 15 14 16 15 16   CREATININE 0.73 1.49* 0.85 0.44 0.72 0.76 0.66  CALCIUM 8.2* 7.7* 8.1* 8.7* 8.9 8.4* 8.4*  MG 1.7 2.4 2.2  --  2.1  --  2.1  PHOS 5.2* 3.6 1.6*  --  3.3  --  3.8   GFR: Estimated Creatinine Clearance: 81.2 mL/min (by C-G formula based on SCr of 0.66 mg/dL). Liver Function Tests: Recent Labs  Lab 05/27/23 1243 05/30/23 0001 06/01/23 0203  AST 36 27 17  ALT 46* 21 18  ALKPHOS 69 53 62  BILITOT 0.6 0.4 0.4  PROT 8.0 5.5* 6.1*  ALBUMIN 4.0 2.5* 2.4*   Recent Labs  Lab 05/27/23 1243  LIPASE 38   No results for input(s): "AMMONIA" in the last 168 hours. Coagulation Profile: Recent Labs  Lab 06/02/23 1748  INR 1.2   Cardiac Enzymes: No results for input(s): "CKTOTAL", "CKMB", "CKMBINDEX", "TROPONINI" in the last 168 hours. BNP (last 3 results) No results for input(s): "PROBNP" in the last 8760 hours. HbA1C: No results for input(s): "HGBA1C" in the last 72 hours. CBG: Recent Labs  Lab 06/02/23 1059 06/02/23 1730 06/02/23 2013 06/02/23 2330 06/03/23 0345  GLUCAP 173* 182* 174* 165* 186*   Lipid Profile: Recent Labs    06/01/23 0203  TRIG 331*   Thyroid Function Tests: No results for input(s): "TSH", "T4TOTAL", "FREET4", "T3FREE", "THYROIDAB" in the last 72 hours. Anemia Panel: No results for input(s): "VITAMINB12", "FOLATE", "FERRITIN", "TIBC", "IRON", "RETICCTPCT" in the last 72 hours. Sepsis Labs: Recent Labs  Lab 06/01/23 0203 06/01/23 1134  PROCALCITON   --  0.76  LATICACIDVEN 1.2  --     Recent Results (from the past 240 hour(s))  MRSA Next Gen by PCR, Nasal     Status: None   Collection Time: 05/27/23  8:06 PM   Specimen: Nasal Mucosa; Nasal Swab  Result Value Ref Range Status   MRSA by PCR Next Gen NOT DETECTED NOT DETECTED Final    Comment: (NOTE) The GeneXpert MRSA Assay (FDA approved for NASAL specimens only), is one component of a comprehensive MRSA colonization surveillance program. It is not intended to diagnose MRSA infection nor to guide or monitor treatment for MRSA infections. Test performance is not FDA approved in patients less than 62 years old. Performed at [facility], [address], [place], [place] [address]   Culture, blood (Routine X 2) w Reflex to ID Panel     Status: None (Preliminary result)   Collection Time: 06/01/23  1:11 AM   Specimen: BLOOD  Result Value Ref Range Status   Specimen Description BLOOD SITE NOT SPECIFIED  Final   Special Requests   Final    BOTTLES DRAWN AEROBIC AND ANAEROBIC Blood Culture adequate volume   Culture   Final    NO GROWTH 2 DAYS Performed at [facility], [address], [place], [place] [address]    Report Status PENDING  Incomplete  Culture, blood (Routine X 2) w Reflex to ID Panel     Status: None (Preliminary result)   Collection Time: 06/01/23 11:35 AM   Specimen: BLOOD LEFT ARM  Result Value Ref Range Status   Specimen Description BLOOD LEFT ARM  Final   Special Requests   Final    BOTTLES DRAWN AEROBIC AND ANAEROBIC Blood Culture adequate volume   Culture   Final    NO GROWTH 2 DAYS Performed at [facility], [address], [place], [place] [address]    Report Status PENDING  Incomplete  Aerobic/Anaerobic Culture w Gram Stain (surgical/deep wound)     Status: None (Preliminary result)   Collection Time: 06/02/23  8:19 PM   Specimen: Abscess  Result  Value Ref Range Status   Specimen Description ABSCESS ABDOMEN  Final   Special Requests  NONE  Final   Gram Stain   Final    FEW WBC PRESENT, PREDOMINANTLY PMN NO ORGANISMS SEEN    Culture   Final    NO GROWTH < 24 HOURS Performed at [facility], [address] [place], [place] [address]    Report Status PENDING  Incomplete     Radiology Studies: CT GUIDED PERITONEAL/RETROPERITONEAL FLUID DRAIN BY PERC CATH  Result Date: 06/03/2023 INDICATION: 42 year old female with postoperative ileus and peripherally enhancing fluid collection in the low abdomen concerning for possible abscess. She presents for CT-guided drain placement. EXAM: CT-guided drain placement MEDICATIONS: The patient is currently admitted to the hospital and receiving intravenous antibiotics. The antibiotics were administered within an appropriate time frame prior to the initiation of the procedure. ANESTHESIA/SEDATION: Moderate (conscious) sedation was employed during this procedure. A total of Versed 2 mg and Fentanyl 75 mcg was administered intravenously by the radiology nurse. Total intra-service moderate Sedation Time: 12 minutes. The patient's level of consciousness and vital signs were monitored continuously by radiology nursing throughout the procedure under my direct supervision. COMPLICATIONS: None immediate. PROCEDURE: Informed written consent was obtained from the patient after a thorough discussion of the procedural risks, benefits and alternatives. All questions were addressed. Maximal Sterile Barrier Technique was utilized including caps, mask, sterile gowns, sterile gloves, sterile drape, hand hygiene and skin antiseptic. A timeout was performed prior to the initiation of the procedure. A planning axial CT scan was performed. The fluid collection in the low abdomen was identified. A suitable skin entry site was selected and marked. Local anesthesia was attained by infiltration with 1% lidocaine. A small dermatotomy was made. Under intermittent CT guidance, an 18 gauge trocar needle was carefully  advanced into the fluid collection. Care was taken to avoid the inferior epigastric vessels. A wire was then advanced and coiled in the fluid collection. The percutaneous tract was dilated to 12 [place]. A Cook 12 [place] all-purpose drainage catheter was advanced over the wire and formed. Aspiration yields turbid peach colored fluid. A sample was sent for Gram stain and culture. Approximately 15 mL was aspirated. The drainage catheter was lavaged and connected to JP bulb suction. The catheter was then secured to the skin with an adhesive fixation device. Post placement CT imaging demonstrates a well-positioned drainage catheter in the collapsed fluid collection. IMPRESSION: Successful placement of 12 French percutaneous drain into the abdominal fluid collection. Approximately 15 mL of turbid peach colored fluid was evacuated. A sample was sent for Gram stain and culture. Electronically Signed   By: Malachy Moan M.D.   On: 06/03/2023 06:44   CT CHEST ABDOMEN PELVIS W CONTRAST  Result Date: 06/01/2023 CLINICAL DATA:  Abdominal pain, post-op EXAM: CT CHEST, ABDOMEN, AND PELVIS WITH CONTRAST TECHNIQUE: Multidetector CT imaging of the chest, abdomen and pelvis was performed following the standard protocol during bolus administration of intravenous contrast. RADIATION DOSE REDUCTION: This exam was performed according to the departmental dose-optimization program which includes automated exposure control, adjustment of the mA and/or kV according to patient size and/or use of iterative reconstruction technique. CONTRAST:  75mL OMNIPAQUE IOHEXOL 350 MG/ML SOLN COMPARISON:  May 27, 2023. April 21, 2015 FINDINGS: CT CHEST FINDINGS Cardiovascular: Heart is normal in size. No pericardial effusion. RIGHT upper extremity PICC tip terminates at the superior cavoatrial junction. Thoracic aorta is normal in course and caliber. Mediastinum/Nodes: Visualized thyroid is unremarkable. No  axillary or mediastinal adenopathy.  Lungs/Pleura: No pleural effusion or pneumothorax. Moderate bibasilar predominant platelike enhancing consolidative opacities most consistent with atelectasis. Scattered biapical centrilobular ground-glass opacities. Musculoskeletal: No chest wall mass or suspicious bone lesions identified. CT ABDOMEN PELVIS FINDINGS Hepatobiliary: No focal liver abnormality is seen. No gallstones, gallbladder wall thickening, or biliary dilatation. Pancreas: Unremarkable. No pancreatic ductal dilatation or surrounding inflammatory changes. Spleen: Normal in size without focal abnormality. Adrenals/Urinary Tract: Adrenal glands are unremarkable. Kidneys enhance symmetrically. No hydronephrosis. No obstructing nephrolithiasis. Small volume intraluminal air within the otherwise unremarkable bladder. Stomach/Bowel: Enteric tube terminates in the stomach. There are multiple dilated loops of small bowel in the upper and mid abdomen. Ileum is decompressed. There is a possible transition point in the RIGHT lower quadrant (series 6, image 77, series 7, image 77; series 3, image 80). This area is just superior to the postsurgical changes. Several prominent areas of enhancement in the small bowel, favored reactive. Appendix is normal. Contrast delineates the proximal small bowel. Vascular/Lymphatic: No significant vascular findings are present. No enlarged abdominal or pelvic lymph nodes. Reproductive: Status post hysterectomy. No adnexal masses. Other: Patient is status post surgical repair of an inferior ventral hernia of the abdominal wall. Surgical drain is in the anterior subcutaneous soft tissues. There is a small volume subcutaneous air with adjacent fat stranding in the subcutaneous tissues. Mild expansion of the abdominal musculature at this level, likely postsurgical. No discrete hematoma. Deep to this there is a rim enhancing fluid collection that is intraperitoneal in location. This measures approximately 7.4 x 3.3 by 5.3 cm. This  layers superior to the bladder Musculoskeletal: No acute or significant osseous findings. Transitional anatomy with bilateral assimilation joints at L5-S1. Unchanged sclerotic area of L1 since at least 2016, consistent with a benign fibro-osseous lesion. IMPRESSION: 1. There are multiple dilated loops of small bowel in the upper and mid abdomen with a possible transition point in the RIGHT lower quadrant. Findings are may reflect small bowel obstruction versus postsurgical ileus. Recommend serial abdominal radiographs to assess for progression of enteric contrast. 2. Patient is status post surgical repair of an inferior ventral hernia of the abdominal wall. There is a rim enhancing fluid collection deep to the surgical site measuring up to 7.4 cm. Differential considerations include postsurgical seroma versus abscess. Recommend correlation with clinical symptomatology 3. Small volume intraluminal air within the otherwise unremarkable bladder. This is favored to be secondary to recent instrumentation. 4. Moderate bibasilar predominant atelectasis. 5. Scattered biapical centrilobular ground-glass opacities could reflect aspiration, infection or atelectasis. Electronically Signed   By: Meda Klinefelter M.D.   On: 06/01/2023 16:25    Scheduled Meds:  Chlorhexidine Gluconate Cloth  6 each Topical Daily   enoxaparin (LOVENOX) injection  40 mg Subcutaneous Daily   insulin aspart  0-15 Units Subcutaneous Q6H   metoprolol tartrate  5 mg Intravenous Q6H   sodium chloride flush  10-40 mL Intracatheter Q12H   Continuous Infusions:  sodium chloride 10 mL/hr (05/30/23 1552)   acetaminophen 1,000 mg (06/03/23 0303)   piperacillin-tazobactam (ZOSYN)  IV 3.375 g (06/03/23 0528)   TPN ADULT (ION) 105 mL/hr at 06/02/23 1800   TPN ADULT (ION)       LOS: 7 days   Hughie Closs, MD [facility]  06/03/2023, 11:17 AM   *Please note that this is a verbal dictation therefore any spelling or grammatical errors  are due to the "Dragon Medical One" system interpretation.  Please page via Amion and do not message  via secure chat for urgent patient care matters. Secure chat can be used for non urgent patient care matters.  How to contact the [facility] Attending or Consulting provider 7A - 7P or covering provider during after hours 7P -7A, for this patient?  Check the care team in [facility] and look for a) attending/consulting [facility] provider listed and b) the [facility] team listed. Page or secure chat 7A-7P. Log into [email or web address] and use [facility] universal password to access. If you do not have the password, please contact the hospital operator. Locate the [facility] provider you are looking for under [facility] and page to a number that you can be directly reached. If you still have difficulty reaching the provider, please page the [facility] (Director on Call) for the Hospitalists listed on amion for assistance.

## 2023-06-04 ENCOUNTER — Other Ambulatory Visit: Payer: Self-pay

## 2023-06-04 DIAGNOSIS — K567 Ileus, unspecified: Secondary | ICD-10-CM | POA: Diagnosis not present

## 2023-06-04 LAB — CBC WITH DIFFERENTIAL/PLATELET
Abs Immature Granulocytes: 0 10*3/uL (ref 0.00–0.07)
Basophils Absolute: 0 10*3/uL (ref 0.0–0.1)
Basophils Relative: 0 %
Eosinophils Absolute: 0.6 10*3/uL — ABNORMAL HIGH (ref 0.0–0.5)
Eosinophils Relative: 3 %
HCT: 28.3 % — ABNORMAL LOW (ref 36.0–46.0)
Hemoglobin: 9.4 g/dL — ABNORMAL LOW (ref 12.0–15.0)
Lymphocytes Relative: 19 %
Lymphs Abs: 3.6 10*3/uL (ref 0.7–4.0)
MCH: 27.6 pg (ref 26.0–34.0)
MCHC: 33.2 g/dL (ref 30.0–36.0)
MCV: 83.2 fL (ref 80.0–100.0)
Monocytes Absolute: 2.8 10*3/uL — ABNORMAL HIGH (ref 0.1–1.0)
Monocytes Relative: 15 %
Neutro Abs: 11.8 10*3/uL — ABNORMAL HIGH (ref 1.7–7.7)
Neutrophils Relative %: 63 %
Platelets: 411 10*3/uL — ABNORMAL HIGH (ref 150–400)
RBC: 3.4 MIL/uL — ABNORMAL LOW (ref 3.87–5.11)
RDW: 14.4 % (ref 11.5–15.5)
WBC: 18.8 10*3/uL — ABNORMAL HIGH (ref 4.0–10.5)
nRBC: 0 % (ref 0.0–0.2)
nRBC: 0 /100{WBCs}

## 2023-06-04 LAB — COMPREHENSIVE METABOLIC PANEL WITH GFR
ALT: 31 U/L (ref 0–44)
AST: 24 U/L (ref 15–41)
Albumin: 2.1 g/dL — ABNORMAL LOW (ref 3.5–5.0)
Alkaline Phosphatase: 146 U/L — ABNORMAL HIGH (ref 38–126)
Anion gap: 11 (ref 5–15)
BUN: 15 mg/dL (ref 6–20)
CO2: 22 mmol/L (ref 22–32)
Calcium: 8.2 mg/dL — ABNORMAL LOW (ref 8.9–10.3)
Chloride: 104 mmol/L (ref 98–111)
Creatinine, Ser: 0.67 mg/dL (ref 0.44–1.00)
GFR, Estimated: >60 mL/min (ref >60)
Glucose, Bld: 174 mg/dL — ABNORMAL HIGH (ref 70–99)
Potassium: 4.3 mmol/L (ref 3.5–5.1)
Sodium: 137 mmol/L (ref 135–145)
Total Bilirubin: 0.4 mg/dL (ref 0.3–1.2)
Total Protein: 5.9 g/dL — ABNORMAL LOW (ref 6.5–8.1)

## 2023-06-04 LAB — GLUCOSE, CAPILLARY
Glucose-Capillary: 171 mg/dL — ABNORMAL HIGH (ref 70–99)
Glucose-Capillary: 153 mg/dL — ABNORMAL HIGH (ref 70–99)
Glucose-Capillary: 141 mg/dL — ABNORMAL HIGH (ref 70–99)
Glucose-Capillary: 162 mg/dL — ABNORMAL HIGH (ref 70–99)

## 2023-06-04 MED ORDER — INSULIN ASPART 100 UNIT/ML IJ SOLN
0.0000 [IU] | Freq: Three times a day (TID) | INTRAMUSCULAR | Status: DC
  Administered 2023-06-04: 2 [IU] via SUBCUTANEOUS
  Administered 2023-06-04 – 2023-06-06 (×5): 3 [IU] via SUBCUTANEOUS

## 2023-06-04 MED ORDER — METOPROLOL TARTRATE 5 MG/5ML IV SOLN
5.0000 mg | Freq: Two times a day (BID) | INTRAVENOUS | Status: DC
  Administered 2023-06-04 – 2023-06-07 (×6): 5 mg via INTRAVENOUS
  Filled 2023-06-04 (×6): qty 5

## 2023-06-04 MED ORDER — MENTHOL 3 MG MT LOZG
1.0000 | LOZENGE | OROMUCOSAL | Status: DC | PRN
  Filled 2023-06-04: qty 9

## 2023-06-04 MED ORDER — METHOCARBAMOL 500 MG PO TABS
500.0000 mg | ORAL_TABLET | Freq: Four times a day (QID) | ORAL | Status: DC
  Administered 2023-06-04 (×4): 500 mg via ORAL
  Filled 2023-06-04 (×5): qty 1

## 2023-06-04 MED ORDER — TRAVASOL 10 % IV SOLN
INTRAVENOUS | Status: AC
  Filled 2023-06-04: qty 1322.4

## 2023-06-04 NOTE — Progress Notes (Signed)
PHARMACY - TOTAL PARENTERAL NUTRITION CONSULT NOTE   Indication: Prolonged ileus  Patient Measurements: Height: 4' 9.01" (144.8 cm) Weight: 81.2 kg (179 lb) IBW/kg (Calculated) : 38.62 TPN AdjBW (KG): 47.1 Body mass index is 38.72 kg/m. Usual Weight: patient reports she is typically 193 lbs (87.7 kg)  Assessment:  41yo female who presented 7/27 following previous admission 7/20-25 where she underwent exlap with lysis of adhesions, serosa repair, evacuation of hemorrhagic ovarian cyst. Diet was advanced to normal over course of admission with emesis POD 3-4 but overall tolerated with normal bowel sounds, BM and flatus prior to discharge 7/25. Pt began to experience abdominal distension and cramping 7/26 that progressed to vomiting 7/27 and has been unable to maintain PO intake. Underwent re-exlap 7/27, hernia reduction, Vicryl mesh underlay, fascial closure. Pharmacy is consulted to initiate TPN.   Glucose / Insulin: BG 153-186, used 9 units SSI/24h SSI and 20 units in TPN Electrolytes: CoCa 9.7, others WNL Renal: Scr 0.66, BUN WNL Hepatic: alk phos146, AST/ALT/Tbili wnl, TG 331, Albumin 2.1 Intake / Output; MIVF: UOP x2 charted, NGT removed, drain 7.5 ml, stool x5 (types 6 &7). Net +5L GI Imaging:  8/1 CT A/P: SBO vs. post-op ileus, intraperitoneal collection in pelvis GI Surgeries / Procedures:  7/27 ex lap, hernia reduction, Vicryl mesh underlay, fascial closure. 8/2 drain to abdominal fluid collection 8/3 NGT removed accidnetally   Central access: PICC TPN start date: 7/29    Nutritional Goals:  Goal rate TPN 95 ml/hr to provide 132 g protein and 2221 kcal  RD Assessment: Estimated Needs Total Energy Estimated Needs: 2200-2400 Total Protein Estimated Needs: 125-145 grams Total Fluid Estimated Needs: >2.2 L  Current Nutrition:  TPN 8/3 CLD  Plan:  Continue TPN at goal rate of 95 ml/hr, provides 100% of estimated needs Electrolytes in TPN: Na 100 mEq/L, decrease K 30  mEq/L, Ca 10 mEq/L, Mg 6 mEq/L, Phos 25 mmol/L. Change Cl:Ac 1:2 Add famotidine 20mg  to TPN Continue regular insulin 20 units in TPN Change Moderate SSI to TID with meals and adjust as needed  Monitor TPN labs daily until stable, then every Mon/Thurs F/u TG, adjust lipids as needed  F/u PO tolerance, wean TPN as able   Thank you for allowing pharmacy to be a part of this patient's care.  Alphia Moh, PharmD, BCPS, BCCP Clinical Pharmacist  Please check AMION for all [facility] Pharmacy phone numbers After 10:00 PM, call Main Pharmacy [phone]

## 2023-06-04 NOTE — Progress Notes (Signed)
Referring Physician(s): Dr. Elige Radon, PA-C    Supervising Physician: Mir, Mauri Reading  Patient Status:  [facility] - In-pt  Chief Complaint:  Intra-abdominal fluid collection POD 10 s/p ex lap evacuation of L hemorrhagic cyst with extensive lysis of adhesions and sigmoid tear requiring repair.  S/p left lateral abd drain placement by Dr. Archer Asa on 06/02/23   Subjective:  Patient sitting in bed, NAD, report abd sore, no N/V, fever or chills.   Allergies: Patient has no known allergies.  Medications: Prior to Admission medications   Medication Sig Start Date End Date Taking? Authorizing Provider  albuterol (PROVENTIL HFA;VENTOLIN HFA) 108 (90 BASE) MCG/ACT inhaler Inhale 1-2 puffs into the lungs every 6 (six) hours as needed for wheezing or shortness of breath. 10/10/15  Yes Lorre Nick, MD  atorvastatin (LIPITOR) 10 MG tablet Take 10 mg by mouth daily. 04/08/23  Yes [provider]  Dulaglutide (TRULICITY) 0.75 MG/0.5ML SOPN Inject 0.75 mg into the skin every Sunday.   Yes [provider]  escitalopram (LEXAPRO) 20 MG tablet Take 20 mg by mouth daily.   Yes [provider]  fluticasone (FLONASE) 50 MCG/ACT nasal spray Place 1 spray into both nostrils 2 (two) times daily as needed for allergies or rhinitis. 02/17/23  Yes [provider]  glyBURIDE (DIABETA) 5 MG tablet Take 1 tablet (5 mg total) by mouth 2 (two) times daily with a meal. 05/25/23  Yes Hermina Staggers, MD  hydrOXYzine (ATARAX) 25 MG tablet Take 25 mg by mouth every 8 (eight) hours as needed (sleep).   Yes [provider]  ibuprofen (ADVIL) 600 MG tablet Take 1 tablet (600 mg total) by mouth every 6 (six) hours as needed. Patient taking differently: Take 600 mg by mouth every 6 (six) hours as needed (for pain). 05/25/23  Yes Hermina Staggers, MD  ondansetron (ZOFRAN-ODT) 4 MG disintegrating tablet Take 1 tablet (4 mg total) by mouth every 8 (eight) hours as needed for  nausea. Patient taking differently: Take 4 mg by mouth every 8 (eight) hours as needed for nausea (DISSOLVE ORALLY). 05/25/23  Yes Hermina Staggers, MD  oxyCODONE (OXY IR/ROXICODONE) 5 MG immediate release tablet Take 1 tablet (5 mg total) by mouth every 6 (six) hours as needed for moderate pain. 05/25/23  Yes Hermina Staggers, MD     Vital Signs: BP 113/74 (BP Location: Left Arm)   Pulse 94   Temp 98.2 F (36.8 C) (Oral)   Resp 16   Ht 4' 9.01" (1.448 m)   Wt 179 lb (81.2 kg)   LMP 05/12/2015 (Exact Date)   SpO2 100%   BMI 38.72 kg/m   Physical Exam Vitals reviewed.  Constitutional:      General: She is not in acute distress.    Appearance: She is not ill-appearing.  HENT:     Head: Normocephalic.  Pulmonary:     Effort: Pulmonary effort is normal.  Abdominal:     General: Abdomen is flat.     Palpations: Abdomen is soft.     Comments: Positive left lateral drain to suction bulb. Site is unremarkable with no erythema, edema, tenderness, bleeding or drainage. Suture and stat lock in place. Dressing is clean, dry, and intact. ~10 ml of simpel ss fluid noted in the bulb. Drain aspirates and flushes well.   + surgical drain with brown fluid in the suction bulb   Musculoskeletal:     Cervical back: Neck supple.  Neurological:  Mental Status: She is alert.  Psychiatric:        Mood and Affect: Mood normal.        Behavior: Behavior normal.     Imaging: CT GUIDED PERITONEAL/RETROPERITONEAL FLUID DRAIN BY PERC CATH  Result Date: 06/03/2023 INDICATION: 42 year old female with postoperative ileus and peripherally enhancing fluid collection in the low abdomen concerning for possible abscess. She presents for CT-guided drain placement. EXAM: CT-guided drain placement MEDICATIONS: The patient is currently admitted to the hospital and receiving intravenous antibiotics. The antibiotics were administered within an appropriate time frame prior to the initiation of the procedure.  ANESTHESIA/SEDATION: Moderate (conscious) sedation was employed during this procedure. A total of Versed 2 mg and Fentanyl 75 mcg was administered intravenously by the radiology nurse. Total intra-service moderate Sedation Time: 12 minutes. The patient's level of consciousness and vital signs were monitored continuously by radiology nursing throughout the procedure under my direct supervision. COMPLICATIONS: None immediate. PROCEDURE: Informed written consent was obtained from the patient after a thorough discussion of the procedural risks, benefits and alternatives. All questions were addressed. Maximal Sterile Barrier Technique was utilized including caps, mask, sterile gowns, sterile gloves, sterile drape, hand hygiene and skin antiseptic. A timeout was performed prior to the initiation of the procedure. A planning axial CT scan was performed. The fluid collection in the low abdomen was identified. A suitable skin entry site was selected and marked. Local anesthesia was attained by infiltration with 1% lidocaine. A small dermatotomy was made. Under intermittent CT guidance, an 18 gauge trocar needle was carefully advanced into the fluid collection. Care was taken to avoid the inferior epigastric vessels. A wire was then advanced and coiled in the fluid collection. The percutaneous tract was dilated to 12 [place]. A Cook 12 [place] all-purpose drainage catheter was advanced over the wire and formed. Aspiration yields turbid peach colored fluid. A sample was sent for Gram stain and culture. Approximately 15 mL was aspirated. The drainage catheter was lavaged and connected to JP bulb suction. The catheter was then secured to the skin with an adhesive fixation device. Post placement CT imaging demonstrates a well-positioned drainage catheter in the collapsed fluid collection. IMPRESSION: Successful placement of 12 French percutaneous drain into the abdominal fluid collection. Approximately 15 mL of turbid peach colored  fluid was evacuated. A sample was sent for Gram stain and culture. Electronically Signed   By: Malachy Moan M.D.   On: 06/03/2023 06:44   CT CHEST ABDOMEN PELVIS W CONTRAST  Result Date: 06/01/2023 CLINICAL DATA:  Abdominal pain, post-op EXAM: CT CHEST, ABDOMEN, AND PELVIS WITH CONTRAST TECHNIQUE: Multidetector CT imaging of the chest, abdomen and pelvis was performed following the standard protocol during bolus administration of intravenous contrast. RADIATION DOSE REDUCTION: This exam was performed according to the departmental dose-optimization program which includes automated exposure control, adjustment of the mA and/or kV according to patient size and/or use of iterative reconstruction technique. CONTRAST:  75mL OMNIPAQUE IOHEXOL 350 MG/ML SOLN COMPARISON:  May 27, 2023. April 21, 2015 FINDINGS: CT CHEST FINDINGS Cardiovascular: Heart is normal in size. No pericardial effusion. RIGHT upper extremity PICC tip terminates at the superior cavoatrial junction. Thoracic aorta is normal in course and caliber. Mediastinum/Nodes: Visualized thyroid is unremarkable. No axillary or mediastinal adenopathy. Lungs/Pleura: No pleural effusion or pneumothorax. Moderate bibasilar predominant platelike enhancing consolidative opacities most consistent with atelectasis. Scattered biapical centrilobular ground-glass opacities. Musculoskeletal: No chest wall mass or suspicious bone lesions identified. CT ABDOMEN PELVIS FINDINGS Hepatobiliary: No focal liver abnormality  is seen. No gallstones, gallbladder wall thickening, or biliary dilatation. Pancreas: Unremarkable. No pancreatic ductal dilatation or surrounding inflammatory changes. Spleen: Normal in size without focal abnormality. Adrenals/Urinary Tract: Adrenal glands are unremarkable. Kidneys enhance symmetrically. No hydronephrosis. No obstructing nephrolithiasis. Small volume intraluminal air within the otherwise unremarkable bladder. Stomach/Bowel: Enteric tube  terminates in the stomach. There are multiple dilated loops of small bowel in the upper and mid abdomen. Ileum is decompressed. There is a possible transition point in the RIGHT lower quadrant (series 6, image 77, series 7, image 77; series 3, image 80). This area is just superior to the postsurgical changes. Several prominent areas of enhancement in the small bowel, favored reactive. Appendix is normal. Contrast delineates the proximal small bowel. Vascular/Lymphatic: No significant vascular findings are present. No enlarged abdominal or pelvic lymph nodes. Reproductive: Status post hysterectomy. No adnexal masses. Other: Patient is status post surgical repair of an inferior ventral hernia of the abdominal wall. Surgical drain is in the anterior subcutaneous soft tissues. There is a small volume subcutaneous air with adjacent fat stranding in the subcutaneous tissues. Mild expansion of the abdominal musculature at this level, likely postsurgical. No discrete hematoma. Deep to this there is a rim enhancing fluid collection that is intraperitoneal in location. This measures approximately 7.4 x 3.3 by 5.3 cm. This layers superior to the bladder Musculoskeletal: No acute or significant osseous findings. Transitional anatomy with bilateral assimilation joints at L5-S1. Unchanged sclerotic area of L1 since at least 2016, consistent with a benign fibro-osseous lesion. IMPRESSION: 1. There are multiple dilated loops of small bowel in the upper and mid abdomen with a possible transition point in the RIGHT lower quadrant. Findings are may reflect small bowel obstruction versus postsurgical ileus. Recommend serial abdominal radiographs to assess for progression of enteric contrast. 2. Patient is status post surgical repair of an inferior ventral hernia of the abdominal wall. There is a rim enhancing fluid collection deep to the surgical site measuring up to 7.4 cm. Differential considerations include postsurgical seroma  versus abscess. Recommend correlation with clinical symptomatology 3. Small volume intraluminal air within the otherwise unremarkable bladder. This is favored to be secondary to recent instrumentation. 4. Moderate bibasilar predominant atelectasis. 5. Scattered biapical centrilobular ground-glass opacities could reflect aspiration, infection or atelectasis. Electronically Signed   By: Meda Klinefelter M.D.   On: 06/01/2023 16:25   DG CHEST PORT 1 VIEW  Result Date: 06/01/2023 CLINICAL DATA:  Fever, shortness of breath EXAM: PORTABLE CHEST 1 VIEW COMPARISON:  None Available. FINDINGS: Transverse diameter of heart is increased. There are no signs of alveolar pulmonary edema. Low lung volumes. Linear densities are seen in lower lung fields. There is no focal consolidation. There is no pleural effusion or pneumothorax. Tip of right PICC line is seen at the junction of superior vena cava and right atrium. Tip of NG tube is seen in the stomach. Side port in the NG tube is noted at the gastroesophageal junction. IMPRESSION: Linear densities are seen in the lower lung fields suggesting subsegmental atelectasis. There are no signs of pulmonary edema or focal pulmonary consolidation. Side-port in NG tube is noted at the gastroesophageal junction. NG tube could be advanced 5-10 cm to place the side port within the stomach. Electronically Signed   By: Ernie Avena M.D.   On: 06/01/2023 09:25    Labs:  CBC: Recent Labs    06/01/23 0203 06/02/23 0515 06/03/23 0541 06/04/23 0629  WBC 14.3* 16.3* 20.0* 18.8*  HGB 10.1* 9.5*  9.5* 9.4*  HCT 30.6* 28.8* 28.0* 28.3*  PLT 404* 398 395 411*    COAGS: Recent Labs    06/02/23 1748  INR 1.2    BMP: Recent Labs    06/01/23 0203 06/02/23 0515 06/03/23 0541 06/04/23 0629  NA 140 138 136 137  K 3.8 3.7 4.0 4.3  CL 102 104 102 104  CO2 24 24 24 22   GLUCOSE 164* 169* 171* 174*  BUN 16 15 16 15   CALCIUM 8.9 8.4* 8.4* 8.2*  CREATININE 0.72 0.76 0.66  0.67  GFRNONAA >60 >60 >60 >60    LIVER FUNCTION TESTS: Recent Labs    05/27/23 1243 05/30/23 0001 06/01/23 0203 06/04/23 0629  BILITOT 0.6 0.4 0.4 0.4  AST 36 27 17 24   ALT 46* 21 18 31   ALKPHOS 69 53 62 146*  PROT 8.0 5.5* 6.1* 5.9*  ALBUMIN 4.0 2.5* 2.4* 2.1*    Assessment and Plan:  42 y.o. female with Intra-abdominal fluid collection POD 10 s/p ex lap evacuation of L hemorrhagic cyst with extensive lysis of adhesions and sigmoid tear requiring repair.  S/p left lateral abd drain placement by Dr. Archer Asa on 06/02/23.   WBC 18.8, hgb 9.4  VSS Cx pending  Drain f/q well, site c/d/I, simple ss fluid in the bulb   Drain Location: left lateral abdomen  Size: Fr size: 12 Fr Date of placement: 8/2   Currently to: Drain collection device: suction bulb 24 hour output:  Output by Drain (mL) 06/02/23 0701 - 06/02/23 1900 06/02/23 1901 - 06/03/23 0700 06/03/23 0701 - 06/03/23 1900 06/03/23 1901 - 06/04/23 0700 06/04/23 0701 - 06/04/23 1524  Closed System Drain 1 LUQ Bulb (JP) 19 Fr. 15 2.5 5  25   Closed System Drain 1 Lateral;Left Abdomen Accordion (Hemovac) 12 Fr. 10 10 2.5  5    Interval imaging/drain manipulation:  None   Current examination: Flushes/aspirates easily.  Insertion site unremarkable. Suture and stat lock in place. Dressed appropriately.   Plan: Continue TID flushes with 5 cc NS. Record output Q shift. Dressing changes QD or PRN if soiled.  Call IR APP or on call IR MD if difficulty flushing or sudden change in drain output.  Repeat imaging/possible drain injection once output < 10 mL/QD (excluding flush material). Consideration for drain removal if output is < 10 mL/QD (excluding flush material), pending discussion with the providing surgical service.  Discharge planning: Please contact IR APP or on call IR MD prior to patient d/c to ensure appropriate follow up plans are in place. Typically patient will follow up with IR clinic 10-14 days post d/c  for repeat imaging/possible drain injection. IR scheduler will contact patient with date/time of appointment. Patient will need to flush drain QD with 5 cc NS, record output QD, dressing changes every 2-3 days or earlier if soiled.   IR will continue to follow - please call with questions or concerns.   Electronically Signed: Willette Brace, PA-C 06/04/2023, 3:21 PM   I spent a total of 15 Minutes at the the patient's bedside AND on the patient's hospital floor or unit, greater than 50% of which was counseling/coordinating care for drain f/u  This chart was dictated using voice recognition software.  Despite best efforts to proofread,  errors can occur which can change the documentation meaning.

## 2023-06-04 NOTE — Progress Notes (Signed)
Assessment & Plan: S/p  ex-lap with extensive LOA, evacuation of hemorrhagic ovarian cyst and serosal repair of colon 05/20/23  Fascial dehiscence of Pfannenstiel incision and small bowel herniation with obstruction  POD#8 - s/p re-exploration, hernia reduction, vicryl mesh underlay and fascial closure  7/27 - clear liquid diet, TNA - CT 8/1 with intraperitoneal collection in pelvis - s/p IR drain 8/2 with peach colored fluid evacuated - IV Zosyn - binder and continue drain in SQ, monitoring incision closely - two staples removed and SQ opened and packed - continue wound care, dressing changes, drainage - mobilize as able   FEN: TPN, CLD VTE: lovenox ID: [email or web address]   - per primary attending -  T2DM AKI on CKD        Darnell Level, MD [facility] practice Office: [phone]        Chief Complaint: Fascial dehiscence  Subjective: Patient in bed, nurse at bedside.  Comfortable.  Objective: Vital signs in last 24 hours: Temp:  [98.1 F (36.7 C)-99.9 F (37.7 C)] 98.2 F (36.8 C) (08/04 0415) Pulse Rate:  [94-112] 94 (08/04 0415) Resp:  [16-18] 16 (08/04 0415) BP: (113-143)/(74-99) 113/74 (08/04 0415) SpO2:  [98 %-100 %] 100 % (08/04 0415) Last BM Date : 06/03/23  Intake/Output from previous day: 08/03 0701 - 08/04 0700 In: 1336 [P.O.:100; I.V.:960.3; IV Piggyback:275.6] Out: 7.5 [Drains:7.5] Intake/Output this shift: No intake/output data recorded.  Physical Exam: HEENT - sclerae clear, mucous membranes moist Abdomen - lower midline wound with purulent drainage centrally - two staples removed and SQ tissue opened and packed; JP drain with cloudy, purulent output; IR perc drain with serous output   Lab Results:  Recent Labs    06/03/23 0541 06/04/23 0629  WBC 20.0* 18.8*  HGB 9.5* 9.4*  HCT 28.0* 28.3*  PLT 395 411*   BMET Recent Labs    06/03/23 0541 06/04/23 0629  NA 136 137  K 4.0 4.3  CL 102 104  CO2 24 22  GLUCOSE 171*  174*  BUN 16 15  CREATININE 0.66 0.67  CALCIUM 8.4* 8.2*   PT/INR Recent Labs    06/02/23 1748  LABPROT 15.5*  INR 1.2   Comprehensive Metabolic Panel:    Component Value Date/Time   NA 137 06/04/2023 0629   NA 136 06/03/2023 0541   K 4.3 06/04/2023 0629   K 4.0 06/03/2023 0541   CL 104 06/04/2023 0629   CL 102 06/03/2023 0541   CO2 22 06/04/2023 0629   CO2 24 06/03/2023 0541   BUN 15 06/04/2023 0629   BUN 16 06/03/2023 0541   CREATININE 0.67 06/04/2023 0629   CREATININE 0.66 06/03/2023 0541   GLUCOSE 174 (H) 06/04/2023 0629   GLUCOSE 171 (H) 06/03/2023 0541   CALCIUM 8.2 (L) 06/04/2023 0629   CALCIUM 8.4 (L) 06/03/2023 0541   AST 24 06/04/2023 0629   AST 17 06/01/2023 0203   ALT 31 06/04/2023 0629   ALT 18 06/01/2023 0203   ALKPHOS 146 (H) 06/04/2023 0629   ALKPHOS 62 06/01/2023 0203   BILITOT 0.4 06/04/2023 0629   BILITOT 0.4 06/01/2023 0203   PROT 5.9 (L) 06/04/2023 0629   PROT 6.1 (L) 06/01/2023 0203   ALBUMIN 2.1 (L) 06/04/2023 0629   ALBUMIN 2.4 (L) 06/01/2023 0203    Studies/Results: CT GUIDED PERITONEAL/RETROPERITONEAL FLUID DRAIN BY PERC CATH  Result Date: 06/03/2023 INDICATION: 42 year old female with postoperative ileus and peripherally enhancing fluid collection in the low abdomen concerning for possible  abscess. She presents for CT-guided drain placement. EXAM: CT-guided drain placement MEDICATIONS: The patient is currently admitted to the hospital and receiving intravenous antibiotics. The antibiotics were administered within an appropriate time frame prior to the initiation of the procedure. ANESTHESIA/SEDATION: Moderate (conscious) sedation was employed during this procedure. A total of Versed 2 mg and Fentanyl 75 mcg was administered intravenously by the radiology nurse. Total intra-service moderate Sedation Time: 12 minutes. The patient's level of consciousness and vital signs were monitored continuously by radiology nursing throughout the procedure  under my direct supervision. COMPLICATIONS: None immediate. PROCEDURE: Informed written consent was obtained from the patient after a thorough discussion of the procedural risks, benefits and alternatives. All questions were addressed. Maximal Sterile Barrier Technique was utilized including caps, mask, sterile gowns, sterile gloves, sterile drape, hand hygiene and skin antiseptic. A timeout was performed prior to the initiation of the procedure. A planning axial CT scan was performed. The fluid collection in the low abdomen was identified. A suitable skin entry site was selected and marked. Local anesthesia was attained by infiltration with 1% lidocaine. A small dermatotomy was made. Under intermittent CT guidance, an 18 gauge trocar needle was carefully advanced into the fluid collection. Care was taken to avoid the inferior epigastric vessels. A wire was then advanced and coiled in the fluid collection. The percutaneous tract was dilated to 12 [place]. A Cook 12 [place] all-purpose drainage catheter was advanced over the wire and formed. Aspiration yields turbid peach colored fluid. A sample was sent for Gram stain and culture. Approximately 15 mL was aspirated. The drainage catheter was lavaged and connected to JP bulb suction. The catheter was then secured to the skin with an adhesive fixation device. Post placement CT imaging demonstrates a well-positioned drainage catheter in the collapsed fluid collection. IMPRESSION: Successful placement of 12 French percutaneous drain into the abdominal fluid collection. Approximately 15 mL of turbid peach colored fluid was evacuated. A sample was sent for Gram stain and culture. Electronically Signed   By: Malachy Moan M.D.   On: 06/03/2023 06:44      Darnell Level 06/04/2023  Patient ID: Maria [name], female   DOB: [birth year], 42 y.o.   MRN: [id number]

## 2023-06-04 NOTE — Progress Notes (Signed)
[facility] Faculty Practice OB/GYN Attending Progress Note  ADMISSION DIAGNOSIS:   Principal Problem:   Ileus (HCC) Active Problems:   Hypoglycemia   Intestinal obstruction (HCC)   Malnutrition of moderate degree   Subjective  Feeling better this am.  Tolerating clears.  Still passing flatus, multiple BMs yesterday.  Denies fever or chills overnight.  Ambulating and voiding without difficulty.   Objective  VITALS:  height is 4' 9.01" (1.448 m) and weight is 81.2 kg. Her oral temperature is 98.2 F (36.8 C). Her blood pressure is 113/74 and her pulse is 94. Her respiration is 16 and oxygen saturation is 100%.    EXAMINATION: CONSTITUTIONAL: no acute distress SKIN: warm and dry NEUROLOGIC: A& O x3 PSYCHIATRIC: Normal mood and affect. Normal behavior. Normal judgment and thought content. CARDIOVASCULAR: RRR- tachycardia improving RESPIRATORY: Effort and breath sounds normal, no problems with respiration noted Abd: distension improving, +BS, drains in place with serosanguinous fluid Ext: no edema, no calf tenderness bilaterally  Laboratory Reports: Urine culture neg Prelim wound culture neg Results for orders placed or performed during the hospital encounter of 05/27/23 (from the past 24 hour(s))  Glucose, capillary     Status: Abnormal   Collection Time: 06/03/23 12:05 PM  Result Value Ref Range   Glucose-Capillary 168 (H) 70 - 99 mg/dL  Glucose, capillary     Status: Abnormal   Collection Time: 06/03/23  6:07 PM  Result Value Ref Range   Glucose-Capillary 186 (H) 70 - 99 mg/dL  Glucose, capillary     Status: Abnormal   Collection Time: 06/04/23  3:00 AM  Result Value Ref Range   Glucose-Capillary 171 (H) 70 - 99 mg/dL  Glucose, capillary     Status: Abnormal   Collection Time: 06/04/23  6:07 AM  Result Value Ref Range   Glucose-Capillary 153 (H) 70 - 99 mg/dL  CBC with Differential/Platelet     Status: Abnormal   Collection Time: 06/04/23  6:29 AM   Result Value Ref Range   WBC 18.8 (H) 4.0 - 10.5 K/uL   RBC 3.40 (L) 3.87 - 5.11 MIL/uL   Hemoglobin 9.4 (L) 12.0 - 15.0 g/dL   HCT 16.1 (L) 09.6 - 04.5 %   MCV 83.2 80.0 - 100.0 fL   MCH 27.6 26.0 - 34.0 pg   MCHC 33.2 30.0 - 36.0 g/dL   RDW 40.9 81.1 - 91.4 %   Platelets 411 (H) 150 - 400 K/uL   nRBC 0.0 0.0 - 0.2 %   Neutrophils Relative % 63 %   Neutro Abs 11.8 (H) 1.7 - 7.7 K/uL   Lymphocytes Relative 19 %   Lymphs Abs 3.6 0.7 - 4.0 K/uL   Monocytes Relative 15 %   Monocytes Absolute 2.8 (H) 0.1 - 1.0 K/uL   Eosinophils Relative 3 %   Eosinophils Absolute 0.6 (H) 0.0 - 0.5 K/uL   Basophils Relative 0 %   Basophils Absolute 0.0 0.0 - 0.1 K/uL   WBC Morphology See Note    nRBC 0 0 /100 WBC   Abs Immature Granulocytes 0.00 0.00 - 0.07 K/uL  Comprehensive metabolic panel     Status: Abnormal   Collection Time: 06/04/23  6:29 AM  Result Value Ref Range   Sodium 137 135 - 145 mmol/L   Potassium 4.3 3.5 - 5.1 mmol/L   Chloride 104 98 - 111 mmol/L   CO2 22 22 - 32 mmol/L   Glucose, Bld 174 (  H) 70 - 99 mg/dL   BUN 15 6 - 20 mg/dL   Creatinine, Ser 0.27 0.44 - 1.00 mg/dL   Calcium 8.2 (L) 8.9 - 10.3 mg/dL   Total Protein 5.9 (L) 6.5 - 8.1 g/dL   Albumin 2.1 (L) 3.5 - 5.0 g/dL   AST 24 15 - 41 U/L   ALT 31 0 - 44 U/L   Alkaline Phosphatase 146 (H) 38 - 126 U/L   Total Bilirubin 0.4 0.3 - 1.2 mg/dL   GFR, Estimated >25 >36 mL/min   Anion gap 11 5 - 15     ASSESSMENT/PLAN: POD#8 s/p re-exploration, hernia reduction, vicryl mesh underlay and fascial closure Initial surgery 7/20- s/p  ex-lap with extensive LOA, evacuation of hemorrhagic ovarian cyst and serosal repair of colon 05/20/23  Fascial dehiscence of Pfannenstiel incision and small bowel herniation with obstruction   1) Low grade temp -seems to slowly be improving -leukocytosis improving -continue on zosyn -to date cultures negative, final results pending  2) Postop ileus- improving -Tolerating clears-  advancing diet per general surgery recommendations -hopeful to transition IV medications to oral -likely plan to ween off TPN as pt is able to tolerate po  3) FEN, DM2 -management per IM team including insulin management  DVT Prophylaxis:  Heparin  Code Status: Full  Myna Hidalgo, DO Attending Obstetrician & Gynecologist, Faculty Practice Center for [employer], [facility]

## 2023-06-04 NOTE — Progress Notes (Signed)
PROGRESS NOTE    Maria [name]  ZOX:[id number] DOB: Mar 01, 1981 DOA: 05/27/2023 PCP: Deatra James, MD   Brief Narrative:  42 yo F PMH DM on glyburide, depression, who is POD 7 (Op date 7/20) ex lap evac of L hemorrhagic ovarian cyst, and extensive intraabdominal LOA, sigmoid colon serosal tear repair. She remained in the hospital until dc 7/25. Prior to dc she had + BM and + flatus. On 7/27 admited to ICU with hypoglycemia, concern for ileus. S/p re-exploration, hernia reduction, Vicryl mesh underlay and fascial closure.   Assessment & Plan:   Principal Problem:   Ileus (HCC) Active Problems:   Hypoglycemia   Intestinal obstruction (HCC)   Malnutrition of moderate degree  DM2, hypoglycemia -most likely poor intake + vomiting + taking home sulfonylurea, initially on dextrose drip and now on TPN, slightly hyperglycemic.  Blood sugar managed by insulin via TPN.    Hernia with bowel obstruction, fascial dehiscence/leukocytosis/low-grade fever -- s/p ex lap hernia reduction, fascia closure (7/27)  Recent ex lap evac of hemorrhagic ovarian cyst, extensive LOA and sigmoid colon serosal repait (7/20).  TPN for nutrition.  Had yet another episode of fever of 101.2 at 2 AM 06/03/2023.  Afebrile since then.  NG tube pulled out yesterday by accident, patient is feeling better, no abdominal pain, had 5 bowel movements and tolerating clear liquid diet.  General surgery managing.   Acute hypoxic respiratory failure: Patient dropped her oxygen saturation and required 2 L of oxygen, chest x-ray suggestive of atelectasis but no edema or consolidation.  This is likely due to pain prohibiting her from taking deep breaths.  She is on room air and comfortable.  Once again encouraged her to use incentive spirometry more often.   AKI -resolved   Low phos -replace in TPN   Anemia Stable.  Monitor daily.  Sinus tachycardia/hypertension: Heart rate and blood pressure both improved.  Was likely due to acute  illness.  In an effort to taper, will reduce frequency of Lopressor 5 mg to every 12 hours.  If stable, will discontinue tomorrow.  DVT prophylaxis: enoxaparin (LOVENOX) injection 40 mg Start: 06/03/23 1000   Code Status: Full Code  Family Communication:  None present at bedside.  Plan of care discussed with patient in length and he/she verbalized understanding and agreed with it.  Status is: Inpatient Remains inpatient appropriate because: Postop ileus   Estimated body mass index is 38.72 kg/m as calculated from the following:   Height as of this encounter: 4' 9.01" (1.448 m).   Weight as of this encounter: 81.2 kg.    Nutritional Assessment: Body mass index is 38.72 kg/m.Marland Kitchen Seen by dietician.  I agree with the assessment and plan as outlined below: Nutrition Status: Nutrition Problem: Moderate Malnutrition Etiology: acute illness (bowel obstruction, nausea, vomiting) Signs/Symptoms: mild muscle depletion, energy intake < or equal to 50% for > or equal to 5 days Interventions: TPN  . Skin Assessment: I have examined the patient's skin and I agree with the wound assessment as performed by the wound care RN as outlined below:    Consultants:  Gynecology and general surgery  Procedures:  As above  Antimicrobials:  Anti-infectives (From admission, onward)    Start     Dose/Rate Route Frequency Ordered Stop   06/01/23 1400  piperacillin-tazobactam (ZOSYN) IVPB 3.375 g        3.375 g 12.5 mL/hr over 240 Minutes Intravenous Every 8 hours 06/01/23 1312     05/27/23 1545  ceFAZolin (ANCEF)  IVPB 2g/100 mL premix       Placed in "And" Linked Group   2 g 200 mL/hr over 30 Minutes Intravenous On call to O.R. 05/27/23 1539 05/27/23 1655   05/27/23 1545  metroNIDAZOLE (FLAGYL) IVPB 500 mg       Placed in "And" Linked Group   500 mg 100 mL/hr over 60 Minutes Intravenous On call to O.R. 05/27/23 1539 05/27/23 1753   05/27/23 1545  metroNIDAZOLE (FLAGYL) 500 MG/100ML IVPB        Note to Pharmacy: Crissie Sickles: cabinet override      05/27/23 1545 05/27/23 1653   05/27/23 1545  ceFAZolin (ANCEF) 2-4 GM/100ML-% IVPB       Note to Pharmacy: Crissie Sickles: cabinet override      05/27/23 1545 05/27/23 1651         Subjective: Patient seen and examined.  Sitting in the chair.  Denies any abdominal pain or nausea.  Objective: Vitals:   06/03/23 1428 06/03/23 1550 06/03/23 2100 06/04/23 0415  BP: (!) 131/96 128/88 121/82 113/74  Pulse: (!) 104 98  94  Resp: 16 16 16 16   Temp: 99.3 F (37.4 C) 99.2 F (37.3 C) 98.1 F (36.7 C) 98.2 F (36.8 C)  TempSrc: Oral Oral Oral Oral  SpO2: 98%  99% 100%  Weight:      Height:        Intake/Output Summary (Last 24 hours) at 06/04/2023 1328 Last data filed at 06/04/2023 1042 Gross per 24 hour  Intake 1335.98 ml  Output 37.5 ml  Net 1298.48 ml   Filed Weights   06/01/23 0443 06/02/23 0410 06/03/23 0717  Weight: 83 kg 83.1 kg 81.2 kg    Examination:  General exam: Appears calm and comfortable  Respiratory system: Clear to auscultation. Respiratory effort normal. Cardiovascular system: S1 & S2 heard, RRR. No JVD, murmurs, rubs, gallops or clicks. No pedal edema. Gastrointestinal system: Abdomen is, slightly distended and generalized tenderness, has abdominal binder in place. No organomegaly or masses felt. Normal bowel sounds heard. Central nervous system: Alert and oriented. No focal neurological deficits. Extremities: Symmetric 5 x 5 power. Skin: No rashes, lesions or ulcers.  Psychiatry: Judgement and insight appear normal. Mood & affect appropriate.   Data Reviewed: I have personally reviewed following labs and imaging studies  CBC: Recent Labs  Lab 05/31/23 0423 06/01/23 0203 06/02/23 0515 06/03/23 0541 06/04/23 0629  WBC 13.1* 14.3* 16.3* 20.0* 18.8*  NEUTROABS  --   --  9.1* 13.8* 11.8*  HGB 10.1* 10.1* 9.5* 9.5* 9.4*  HCT 31.0* 30.6* 28.8* 28.0* 28.3*  MCV 85.2 84.5 83.7 82.6 83.2  PLT  390 404* 398 395 411*   Basic Metabolic Panel: Recent Labs  Lab 05/29/23 0532 05/30/23 0001 05/31/23 0423 06/01/23 0203 06/02/23 0515 06/03/23 0541 06/04/23 0629  NA 137 135 139 140 138 136 137  K 3.6 3.7 3.7 3.8 3.7 4.0 4.3  CL 103 102 102 102 104 102 104  CO2 27 24 26 24 24 24 22   GLUCOSE 172* 226* 171* 164* 169* 171* 174*  BUN 24* 15 14 16 15 16 15   CREATININE 1.49* 0.85 0.44 0.72 0.76 0.66 0.67  CALCIUM 7.7* 8.1* 8.7* 8.9 8.4* 8.4* 8.2*  MG 2.4 2.2  --  2.1  --  2.1  --   PHOS 3.6 1.6*  --  3.3  --  3.8  --    GFR: Estimated Creatinine Clearance: 81.2 mL/min (by C-G formula based on SCr  of 0.67 mg/dL). Liver Function Tests: Recent Labs  Lab 05/30/23 0001 06/01/23 0203 06/04/23 0629  AST 27 17 24   ALT 21 18 31   ALKPHOS 53 62 146*  BILITOT 0.4 0.4 0.4  PROT 5.5* 6.1* 5.9*  ALBUMIN 2.5* 2.4* 2.1*   No results for input(s): "LIPASE", "AMYLASE" in the last 168 hours.  No results for input(s): "AMMONIA" in the last 168 hours. Coagulation Profile: Recent Labs  Lab 06/02/23 1748  INR 1.2   Cardiac Enzymes: No results for input(s): "CKTOTAL", "CKMB", "CKMBINDEX", "TROPONINI" in the last 168 hours. BNP (last 3 results) No results for input(s): "PROBNP" in the last 8760 hours. HbA1C: No results for input(s): "HGBA1C" in the last 72 hours. CBG: Recent Labs  Lab 06/03/23 0345 06/03/23 1205 06/03/23 1807 06/04/23 0300 06/04/23 0607  GLUCAP 186* 168* 186* 171* 153*   Lipid Profile: No results for input(s): "CHOL", "HDL", "LDLCALC", "TRIG", "CHOLHDL", "LDLDIRECT" in the last 72 hours.  Thyroid Function Tests: No results for input(s): "TSH", "T4TOTAL", "FREET4", "T3FREE", "THYROIDAB" in the last 72 hours. Anemia Panel: No results for input(s): "VITAMINB12", "FOLATE", "FERRITIN", "TIBC", "IRON", "RETICCTPCT" in the last 72 hours. Sepsis Labs: Recent Labs  Lab 06/01/23 0203 06/01/23 1134  PROCALCITON  --  0.76  LATICACIDVEN 1.2  --     Recent Results  (from the past 240 hour(s))  MRSA Next Gen by PCR, Nasal     Status: None   Collection Time: 05/27/23  8:06 PM   Specimen: Nasal Mucosa; Nasal Swab  Result Value Ref Range Status   MRSA by PCR Next Gen NOT DETECTED NOT DETECTED Final    Comment: (NOTE) The GeneXpert MRSA Assay (FDA approved for NASAL specimens only), is one component of a comprehensive MRSA colonization surveillance program. It is not intended to diagnose MRSA infection nor to guide or monitor treatment for MRSA infections. Test performance is not FDA approved in patients less than 56 years old. Performed at [facility], [address], [place], [place] [address]   Culture, blood (Routine X 2) w Reflex to ID Panel     Status: None (Preliminary result)   Collection Time: 06/01/23  1:11 AM   Specimen: BLOOD  Result Value Ref Range Status   Specimen Description BLOOD SITE NOT SPECIFIED  Final   Special Requests   Final    BOTTLES DRAWN AEROBIC AND ANAEROBIC Blood Culture adequate volume   Culture   Final    NO GROWTH 3 DAYS Performed at [facility], [address] [place], [place] [address]    Report Status PENDING  Incomplete  Culture, blood (Routine X 2) w Reflex to ID Panel     Status: None (Preliminary result)   Collection Time: 06/01/23 11:35 AM   Specimen: BLOOD LEFT ARM  Result Value Ref Range Status   Specimen Description BLOOD LEFT ARM  Final   Special Requests   Final    BOTTLES DRAWN AEROBIC AND ANAEROBIC Blood Culture adequate volume   Culture   Final    NO GROWTH 3 DAYS Performed at [facility], [address], [place], [place] [address]    Report Status PENDING  Incomplete  Urine Culture     Status: Abnormal   Collection Time: 06/02/23 10:50 AM   Specimen: Urine, Clean Catch  Result Value Ref Range Status   Specimen Description URINE, CLEAN CATCH  Final   Special Requests NONE  Final   Culture (A)  Final    <  10,000 COLONIES/mL INSIGNIFICANT GROWTH Performed at [facility], [address], [place], [place] [address]    Report Status 06/03/2023 FINAL  Final  Aerobic/Anaerobic Culture w Gram Stain (surgical/deep wound)     Status: None (Preliminary result)   Collection Time: 06/02/23  8:19 PM   Specimen: Abscess  Result Value Ref Range Status   Specimen Description ABSCESS ABDOMEN  Final   Special Requests NONE  Final   Gram Stain   Final    FEW WBC PRESENT, PREDOMINANTLY PMN NO ORGANISMS SEEN    Culture   Final    NO GROWTH 2 DAYS Performed at [facility], [address] [place], [place] [address]    Report Status PENDING  Incomplete     Radiology Studies: CT GUIDED PERITONEAL/RETROPERITONEAL FLUID DRAIN BY PERC CATH  Result Date: 06/03/2023 INDICATION: 42 year old female with postoperative ileus and peripherally enhancing fluid collection in the low abdomen concerning for possible abscess. She presents for CT-guided drain placement. EXAM: CT-guided drain placement MEDICATIONS: The patient is currently admitted to the hospital and receiving intravenous antibiotics. The antibiotics were administered within an appropriate time frame prior to the initiation of the procedure. ANESTHESIA/SEDATION: Moderate (conscious) sedation was employed during this procedure. A total of Versed 2 mg and Fentanyl 75 mcg was administered intravenously by the radiology nurse. Total intra-service moderate Sedation Time: 12 minutes. The patient's level of consciousness and vital signs were monitored continuously by radiology nursing throughout the procedure under my direct supervision. COMPLICATIONS: None immediate. PROCEDURE: Informed written consent was obtained from the patient after a thorough discussion of the procedural risks, benefits and alternatives. All questions were addressed. Maximal Sterile Barrier Technique was utilized including caps, mask, sterile gowns, sterile gloves, sterile drape, hand hygiene and skin antiseptic. A timeout was performed prior to  the initiation of the procedure. A planning axial CT scan was performed. The fluid collection in the low abdomen was identified. A suitable skin entry site was selected and marked. Local anesthesia was attained by infiltration with 1% lidocaine. A small dermatotomy was made. Under intermittent CT guidance, an 18 gauge trocar needle was carefully advanced into the fluid collection. Care was taken to avoid the inferior epigastric vessels. A wire was then advanced and coiled in the fluid collection. The percutaneous tract was dilated to 12 [place]. A Cook 12 [place] all-purpose drainage catheter was advanced over the wire and formed. Aspiration yields turbid peach colored fluid. A sample was sent for Gram stain and culture. Approximately 15 mL was aspirated. The drainage catheter was lavaged and connected to JP bulb suction. The catheter was then secured to the skin with an adhesive fixation device. Post placement CT imaging demonstrates a well-positioned drainage catheter in the collapsed fluid collection. IMPRESSION: Successful placement of 12 French percutaneous drain into the abdominal fluid collection. Approximately 15 mL of turbid peach colored fluid was evacuated. A sample was sent for Gram stain and culture. Electronically Signed   By: Malachy Moan M.D.   On: 06/03/2023 06:44    Scheduled Meds:  acetaminophen  1,000 mg Oral Q6H   Chlorhexidine Gluconate Cloth  6 each Topical Daily   enoxaparin (LOVENOX) injection  40 mg Subcutaneous Daily   insulin aspart  0-15 Units Subcutaneous TID WC   methocarbamol  500 mg Oral QID   metoprolol tartrate  5 mg Intravenous Q6H   sodium chloride flush  10-40 mL Intracatheter Q12H   Continuous Infusions:  sodium chloride 10 mL/hr (05/30/23 1552)  piperacillin-tazobactam (ZOSYN)  IV 3.375 g (06/04/23 0607)   TPN ADULT (ION) 95 mL/hr at 06/03/23 1813   TPN ADULT (ION)       LOS: 8 days   Hughie Closs, MD [facility]  06/04/2023, 1:28 PM   *Please  note that this is a verbal dictation therefore any spelling or grammatical errors are due to the "Dragon [employer] One" system interpretation.  Please page via Amion and do not message via secure chat for urgent patient care matters. Secure chat can be used for non urgent patient care matters.  How to contact the [facility] Attending or Consulting provider 7A - 7P or covering provider during after hours 7P -7A, for this patient?  Check the care team in [facility] and look for a) attending/consulting [facility] provider listed and b) the [facility] team listed. Page or secure chat 7A-7P. Log into [email or web address] and use [place]'s universal password to access. If you do not have the password, please contact the hospital operator. Locate the [facility] provider you are looking for under [facility] and page to a number that you can be directly reached. If you still have difficulty reaching the provider, please page the [facility] (Director on Call) for the Hospitalists listed on amion for assistance.

## 2023-06-05 DIAGNOSIS — K567 Ileus, unspecified: Secondary | ICD-10-CM | POA: Diagnosis not present

## 2023-06-05 LAB — GLUCOSE, CAPILLARY
Glucose-Capillary: 174 mg/dL — ABNORMAL HIGH (ref 70–99)
Glucose-Capillary: 174 mg/dL — ABNORMAL HIGH (ref 70–99)
Glucose-Capillary: 179 mg/dL — ABNORMAL HIGH (ref 70–99)

## 2023-06-05 MED ORDER — CYCLOBENZAPRINE HCL 10 MG PO TABS
5.0000 mg | ORAL_TABLET | Freq: Three times a day (TID) | ORAL | Status: DC
  Administered 2023-06-05 – 2023-06-09 (×14): 5 mg via ORAL
  Filled 2023-06-05 (×14): qty 1

## 2023-06-05 MED ORDER — HYDROXYZINE HCL 25 MG PO TABS: 25.0000 mg | ORAL_TABLET | Freq: Three times a day (TID) | ORAL | Status: DC | PRN

## 2023-06-05 MED ORDER — HYDROMORPHONE HCL 1 MG/ML IJ SOLN: 0.5000 mg | INTRAMUSCULAR | Status: DC | PRN

## 2023-06-05 MED ORDER — ENSURE ENLIVE PO LIQD
237.0000 mL | Freq: Two times a day (BID) | ORAL | Status: DC
  Administered 2023-06-05 – 2023-06-09 (×9): 237 mL via ORAL

## 2023-06-05 MED ORDER — TRAVASOL 10 % IV SOLN
INTRAVENOUS | Status: AC
  Filled 2023-06-05: qty 1322.4

## 2023-06-05 MED ORDER — ESCITALOPRAM OXALATE 20 MG PO TABS
20.0000 mg | ORAL_TABLET | Freq: Every day | ORAL | Status: DC
  Administered 2023-06-05 – 2023-06-09 (×5): 20 mg via ORAL
  Filled 2023-06-05 (×5): qty 1

## 2023-06-05 MED ORDER — ACETAMINOPHEN 160 MG/5ML PO SOLN
650.0000 mg | Freq: Four times a day (QID) | ORAL | Status: DC
  Administered 2023-06-05 – 2023-06-09 (×16): 650 mg via ORAL
  Filled 2023-06-05 (×18): qty 20.3

## 2023-06-05 NOTE — Progress Notes (Signed)
Referring Physician(s): Adin Hector PA   Supervising Physician: Richarda Overlie  Patient Status:  [facility] - In-pt  Chief Complaint:  fascial dehiscence POD 10 L hemorrhage ovarian complicated by Intra abdominal abscess s/p left lateral drain placement on 8.3.24  Subjective:  Patient sitting in recliner eating lunch. Denies any questions or concerns at this time. States that she is feeling better  Allergies: Patient has no known allergies.  Medications: Prior to Admission medications   Medication Sig Start Date End Date Taking? Authorizing Provider  albuterol (PROVENTIL HFA;VENTOLIN HFA) 108 (90 BASE) MCG/ACT inhaler Inhale 1-2 puffs into the lungs every 6 (six) hours as needed for wheezing or shortness of breath. 10/10/15  Yes Lorre Nick, MD  atorvastatin (LIPITOR) 10 MG tablet Take 10 mg by mouth daily. 04/08/23  Yes [provider]  Dulaglutide (TRULICITY) 0.75 MG/0.5ML SOPN Inject 0.75 mg into the skin every Sunday.   Yes [provider]  escitalopram (LEXAPRO) 20 MG tablet Take 20 mg by mouth daily.   Yes [provider]  fluticasone (FLONASE) 50 MCG/ACT nasal spray Place 1 spray into both nostrils 2 (two) times daily as needed for allergies or rhinitis. 02/17/23  Yes [provider]  glyBURIDE (DIABETA) 5 MG tablet Take 1 tablet (5 mg total) by mouth 2 (two) times daily with a meal. 05/25/23  Yes Hermina Staggers, MD  hydrOXYzine (ATARAX) 25 MG tablet Take 25 mg by mouth every 8 (eight) hours as needed (sleep).   Yes [provider]  ibuprofen (ADVIL) 600 MG tablet Take 1 tablet (600 mg total) by mouth every 6 (six) hours as needed. Patient taking differently: Take 600 mg by mouth every 6 (six) hours as needed (for pain). 05/25/23  Yes Hermina Staggers, MD  ondansetron (ZOFRAN-ODT) 4 MG disintegrating tablet Take 1 tablet (4 mg total) by mouth every 8 (eight) hours as needed for nausea. Patient taking differently: Take 4 mg by mouth every 8  (eight) hours as needed for nausea (DISSOLVE ORALLY). 05/25/23  Yes Hermina Staggers, MD  oxyCODONE (OXY IR/ROXICODONE) 5 MG immediate release tablet Take 1 tablet (5 mg total) by mouth every 6 (six) hours as needed for moderate pain. 05/25/23  Yes Hermina Staggers, MD     Vital Signs: BP (!) 123/91 (BP Location: Left Arm)   Pulse 100   Temp 99 F (37.2 C) (Oral)   Resp 18   Ht 4' 9.01" (1.448 m)   Wt 179 lb (81.2 kg)   LMP 05/12/2015 (Exact Date)   SpO2 99%   BMI 38.72 kg/m   Physical Exam Vitals and nursing note reviewed.  Constitutional:      Appearance: She is well-developed. She is obese.  HENT:     Head: Normocephalic and atraumatic.  Eyes:     Conjunctiva/sclera: Conjunctivae normal.  Pulmonary:     Effort: Pulmonary effort is normal.  Abdominal:     Comments: Positive left later  drain to suction. Site is unremarkable with no erythema, edema, tenderness, bleeding or drainage noted at exit site. Suture and stat lock in place. Dressing is clean dry and intact. 10 ml of  amber  colored fluid noted in  bulb suction device. Patient states that the drain was just flushed. No leakage or pain with flushing.  Insertion site clean and dry.     Musculoskeletal:        General: Normal range of motion.     Cervical back: Normal range of motion.  Skin:    General: Skin is warm and dry.  Neurological:     General: No focal deficit present.     Mental Status: She is alert and oriented to person, place, and time.  Psychiatric:        Mood and Affect: Mood normal.        Behavior: Behavior normal.        Thought Content: Thought content normal.        Judgment: Judgment normal.     Imaging: CT GUIDED PERITONEAL/RETROPERITONEAL FLUID DRAIN BY PERC CATH  Result Date: 06/03/2023 INDICATION: 42 year old female with postoperative ileus and peripherally enhancing fluid collection in the low abdomen concerning for possible abscess. She presents for CT-guided drain placement. EXAM:  CT-guided drain placement MEDICATIONS: The patient is currently admitted to the hospital and receiving intravenous antibiotics. The antibiotics were administered within an appropriate time frame prior to the initiation of the procedure. ANESTHESIA/SEDATION: Moderate (conscious) sedation was employed during this procedure. A total of Versed 2 mg and Fentanyl 75 mcg was administered intravenously by the radiology nurse. Total intra-service moderate Sedation Time: 12 minutes. The patient's level of consciousness and vital signs were monitored continuously by radiology nursing throughout the procedure under my direct supervision. COMPLICATIONS: None immediate. PROCEDURE: Informed written consent was obtained from the patient after a thorough discussion of the procedural risks, benefits and alternatives. All questions were addressed. Maximal Sterile Barrier Technique was utilized including caps, mask, sterile gowns, sterile gloves, sterile drape, hand hygiene and skin antiseptic. A timeout was performed prior to the initiation of the procedure. A planning axial CT scan was performed. The fluid collection in the low abdomen was identified. A suitable skin entry site was selected and marked. Local anesthesia was attained by infiltration with 1% lidocaine. A small dermatotomy was made. Under intermittent CT guidance, an 18 gauge trocar needle was carefully advanced into the fluid collection. Care was taken to avoid the inferior epigastric vessels. A wire was then advanced and coiled in the fluid collection. The percutaneous tract was dilated to 12 [place]. A Cook 12 [place] all-purpose drainage catheter was advanced over the wire and formed. Aspiration yields turbid peach colored fluid. A sample was sent for Gram stain and culture. Approximately 15 mL was aspirated. The drainage catheter was lavaged and connected to JP bulb suction. The catheter was then secured to the skin with an adhesive fixation device. Post placement CT  imaging demonstrates a well-positioned drainage catheter in the collapsed fluid collection. IMPRESSION: Successful placement of 12 French percutaneous drain into the abdominal fluid collection. Approximately 15 mL of turbid peach colored fluid was evacuated. A sample was sent for Gram stain and culture. Electronically Signed   By: Malachy Moan M.D.   On: 06/03/2023 06:44   CT CHEST ABDOMEN PELVIS W CONTRAST  Result Date: 06/01/2023 CLINICAL DATA:  Abdominal pain, post-op EXAM: CT CHEST, ABDOMEN, AND PELVIS WITH CONTRAST TECHNIQUE: Multidetector CT imaging of the chest, abdomen and pelvis was performed following the standard protocol during bolus administration of intravenous contrast. RADIATION DOSE REDUCTION: This exam was performed according to the departmental dose-optimization program which includes automated exposure control, adjustment of the mA and/or kV according to patient size and/or use of iterative reconstruction technique. CONTRAST:  75mL OMNIPAQUE IOHEXOL 350 MG/ML SOLN COMPARISON:  May 27, 2023. April 21, 2015 FINDINGS: CT CHEST FINDINGS Cardiovascular: Heart is normal in size. No pericardial effusion. RIGHT upper extremity PICC tip terminates at the superior cavoatrial junction. Thoracic aorta is normal in  course and caliber. Mediastinum/Nodes: Visualized thyroid is unremarkable. No axillary or mediastinal adenopathy. Lungs/Pleura: No pleural effusion or pneumothorax. Moderate bibasilar predominant platelike enhancing consolidative opacities most consistent with atelectasis. Scattered biapical centrilobular ground-glass opacities. Musculoskeletal: No chest wall mass or suspicious bone lesions identified. CT ABDOMEN PELVIS FINDINGS Hepatobiliary: No focal liver abnormality is seen. No gallstones, gallbladder wall thickening, or biliary dilatation. Pancreas: Unremarkable. No pancreatic ductal dilatation or surrounding inflammatory changes. Spleen: Normal in size without focal abnormality.  Adrenals/Urinary Tract: Adrenal glands are unremarkable. Kidneys enhance symmetrically. No hydronephrosis. No obstructing nephrolithiasis. Small volume intraluminal air within the otherwise unremarkable bladder. Stomach/Bowel: Enteric tube terminates in the stomach. There are multiple dilated loops of small bowel in the upper and mid abdomen. Ileum is decompressed. There is a possible transition point in the RIGHT lower quadrant (series 6, image 77, series 7, image 77; series 3, image 80). This area is just superior to the postsurgical changes. Several prominent areas of enhancement in the small bowel, favored reactive. Appendix is normal. Contrast delineates the proximal small bowel. Vascular/Lymphatic: No significant vascular findings are present. No enlarged abdominal or pelvic lymph nodes. Reproductive: Status post hysterectomy. No adnexal masses. Other: Patient is status post surgical repair of an inferior ventral hernia of the abdominal wall. Surgical drain is in the anterior subcutaneous soft tissues. There is a small volume subcutaneous air with adjacent fat stranding in the subcutaneous tissues. Mild expansion of the abdominal musculature at this level, likely postsurgical. No discrete hematoma. Deep to this there is a rim enhancing fluid collection that is intraperitoneal in location. This measures approximately 7.4 x 3.3 by 5.3 cm. This layers superior to the bladder Musculoskeletal: No acute or significant osseous findings. Transitional anatomy with bilateral assimilation joints at L5-S1. Unchanged sclerotic area of L1 since at least 2016, consistent with a benign fibro-osseous lesion. IMPRESSION: 1. There are multiple dilated loops of small bowel in the upper and mid abdomen with a possible transition point in the RIGHT lower quadrant. Findings are may reflect small bowel obstruction versus postsurgical ileus. Recommend serial abdominal radiographs to assess for progression of enteric contrast. 2.  Patient is status post surgical repair of an inferior ventral hernia of the abdominal wall. There is a rim enhancing fluid collection deep to the surgical site measuring up to 7.4 cm. Differential considerations include postsurgical seroma versus abscess. Recommend correlation with clinical symptomatology 3. Small volume intraluminal air within the otherwise unremarkable bladder. This is favored to be secondary to recent instrumentation. 4. Moderate bibasilar predominant atelectasis. 5. Scattered biapical centrilobular ground-glass opacities could reflect aspiration, infection or atelectasis. Electronically Signed   By: Meda Klinefelter M.D.   On: 06/01/2023 16:25    Labs:  CBC: Recent Labs    06/01/23 0203 06/02/23 0515 06/03/23 0541 06/04/23 0629  WBC 14.3* 16.3* 20.0* 18.8*  HGB 10.1* 9.5* 9.5* 9.4*  HCT 30.6* 28.8* 28.0* 28.3*  PLT 404* 398 395 411*    COAGS: Recent Labs    06/02/23 1748  INR 1.2    BMP: Recent Labs    06/02/23 0515 06/03/23 0541 06/04/23 0629 06/05/23 0639  NA 138 136 137 135  K 3.7 4.0 4.3 4.2  CL 104 102 104 100  CO2 24 24 22 25   GLUCOSE 169* 171* 174* 169*  BUN 15 16 15 12   CALCIUM 8.4* 8.4* 8.2* 8.3*  CREATININE 0.76 0.66 0.67 0.73  GFRNONAA >60 >60 >60 >60    LIVER FUNCTION TESTS: Recent Labs    05/30/23 0001  06/01/23 0203 06/04/23 0629 06/05/23 0639  BILITOT 0.4 0.4 0.4 0.3  AST 27 17 24 30   ALT 21 18 31  48*  ALKPHOS 53 62 146* 155*  PROT 5.5* 6.1* 5.9* 6.3*  ALBUMIN 2.5* 2.4* 2.1* 2.2*    Assessment and Plan:  42 y.o. female inpatient. History of DM, uterine fibroids. S/p sigmoid colon serosal tear repair on 7.20.24. Patient was discharged on 7.25.27 and returned to the ED at [facility] on 7.27.24 with abdominal pain and vomiting  s/p ex lap of hemorrhagic left sided ovarian cyst with extensive lysis of adhesions  on 7.27.24. Found to have a post op fluid collection on CT CAP from 8.1.24. On 8.3.24 IR placed a 12 Fr abscess drain to  the intra abdominal abscess.   Drain Location: left lateral  Size: Fr size: 12 Fr Date of placement: 8.3.24  Currently to: Drain collection device: suction bulb 24 hour output:  Output by Drain (mL) 06/03/23 0701 - 06/03/23 1900 06/03/23 1901 - 06/04/23 0700 06/04/23 0701 - 06/04/23 1900 06/04/23 1901 - 06/05/23 0700 06/05/23 0701 - 06/05/23 1314  Closed System Drain 1 LUQ Bulb (JP) 19 Fr. 5  125  50  Closed System Drain 1 Lateral;Left Abdomen Accordion (Hemovac) 12 Fr. 2.5  60  5  Cultures showed no growth in 3 days  Interval imaging/drain manipulation:  None since drain placement  Current examination: Flushes/aspirates easily.  Insertion site unremarkable. Suture and stat lock in place. Dressed appropriately.   Plan: Continue TID flushes with 5 cc NS. Record output Q shift. Dressing changes QD or PRN if soiled.  Call IR APP or on call IR MD if difficulty flushing or sudden change in drain output.  Repeat imaging/possible drain injection once output < 10 mL/QD (excluding flush material). Consideration for drain removal if output is < 10 mL/QD (excluding flush material), pending discussion with the providing surgical service.  Discharge planning: Please contact IR APP or on call IR MD prior to patient d/c to ensure appropriate follow up plans are in place. Typically patient will follow up with IR clinic 10-14 days post d/c for repeat imaging/possible drain injection. IR scheduler will contact patient with date/time of appointment. Patient will need to flush drain QD with 5 cc NS, record output QD, dressing changes every 2-3 days or earlier if soiled.   IR will continue to follow - please call with questions or concerns.   Electronically Signed: Alene Mires, NP 06/05/2023, 1:14 PM   I spent a total of 15 Minutes at the patient's bedside AND on the patient's hospital floor or unit, greater than 50% of which was counseling/coordinating care for left lateral abscess  drain.

## 2023-06-05 NOTE — Progress Notes (Signed)
Nutrition Follow-up  DOCUMENTATION CODES:   Non-severe (moderate) malnutrition in context of acute illness/injury  INTERVENTION:  Strawberry Ensure Plus High Protein po BID, each supplement provides 350 kcal and 20 grams of protein.  Advance diet as tolerated   Encourage po intake   Wean TPN per [facility] management   NUTRITION DIAGNOSIS:   Moderate Malnutrition related to acute illness (bowel obstruction, nausea, vomiting) as evidenced by mild muscle depletion, energy intake < or equal to 50% for > or equal to 5 days. -ongoing   GOAL:   Patient will meet greater than or equal to 90% of their needs -progressing   MONITOR:   Diet advancement, Labs, Weight trends, Skin, I & O's, Other (Comment) (TPN)  REASON FOR ASSESSMENT:   Consult New TPN/TNA  ASSESSMENT:   42 y.o. female admits related to hypoglycemia. PMH includes: DM, hemorrhagic ovarian cyst, intraabdominal adhesions. Pt with more recent admission from 05/19/23-05/25/23 and is currently POD8 from ex-lap evac of L hemorrhagic ovarian cyst, and extensive intraabdominal LOA, sigmoid colon serosal tear repair. Pt discharged on 7/25 and prior to discharge she had a +BM and +flatus. On 7/26, pt began to have abdominal pain and distention. Pt then began to experience ongoing vomiting starting on 7/27. Pt is now receiving medical management related to hernia with bowel obstruction, fascial dehiscence -- s/p ex lap hernia reduction, fascia closure (7/27).  Visited patient at bedside who had family present. The NG tube has been d/c'd. She reports throat soreness which has made it difficult to take her meds and such. She reports tolerating 1.5 bowls of broth today and she has not tried anything more yet, but plans on trying a strained soup. She is agreeable to ONS.   TPN weaning. Diet to advance as tolerated   Labs: CBG 174, alk phos 155, TG 175  Meds:  TPN @95  ml/hr (goal), insulin, zosyn, NS, flexeril  Wt: admit 184#, current 179#   I/O's: +4.6 L since admission    Diet Order:   Diet Order             Diet full liquid Room service appropriate? Yes; Fluid consistency: Thin  Diet effective now                   EDUCATION NEEDS:   Education needs have been addressed  Skin:  Skin Assessment: Skin Integrity Issues: Skin Integrity Issues:: Incisions Incisions: abdomen  Last BM:  8/5  Height:   Ht Readings from Last 1 Encounters:  05/27/23 4' 9.01" (1.448 m)    Weight:   Wt Readings from Last 1 Encounters:  06/03/23 81.2 kg    Ideal Body Weight:     BMI:  Body mass index is 38.72 kg/m.  Estimated Nutritional Needs:   Kcal:  2200-2400  Protein:  125-145 grams  Fluid:  >2.2 L    Leodis Rains, RDN, LDN  Clinical Nutrition

## 2023-06-05 NOTE — Progress Notes (Addendum)
PHARMACY - TOTAL PARENTERAL NUTRITION CONSULT NOTE   Indication: Prolonged ileus  Patient Measurements: Height: 4' 9.01" (144.8 cm) Weight: 81.2 kg (179 lb) IBW/kg (Calculated) : 38.62 TPN AdjBW (KG): 47.1 Body mass index is 38.72 kg/m. Usual Weight: patient reports she is typically 193 lbs (87.7 kg)  Assessment:  42yo female who presented 7/27 following previous admission 7/20-25 where she underwent exlap with lysis of adhesions, serosa repair, evacuation of hemorrhagic ovarian cyst. Diet was advanced to normal over course of admission with emesis POD 3-4 but overall tolerated with normal bowel sounds, BM and flatus prior to discharge 7/25. Pt began to experience abdominal distension and cramping 7/26 that progressed to vomiting 7/27 and has been unable to maintain PO intake. Underwent re-exlap 7/27, hernia reduction, Vicryl mesh underlay, fascial closure. Pharmacy is consulted to initiate TPN.   Glucose / Insulin: BG 141-174, used 8 units SSI/24h SSI and 20 units in TPN Electrolytes: CoCa 9.7, Na: 135, steadily down-trending, others WNL Renal: Scr 0.66, BUN WNL Hepatic: alk phos155, AST/ALT/Tbili wnl, TG 175, Albumin 2.2 Intake / Output; MIVF: UOP x1 charted, NGT removed, drain 185 ml, stool x0 per chart. Net +7L GI Imaging:  8/1 CT A/P: SBO vs. post-op ileus, intraperitoneal collection in pelvis GI Surgeries / Procedures:  7/27 ex lap, hernia reduction, Vicryl mesh underlay, fascial closure. 8/2 drain to abdominal fluid collection 8/3 NGT removed accidnetally   Central access: PICC TPN start date: 7/29    Nutritional Goals:  Goal rate TPN 95 ml/hr to provide 132 g protein and 2221 kcal  RD Assessment: Estimated Needs Total Energy Estimated Needs: 2200-2400 Total Protein Estimated Needs: 125-145 grams Total Fluid Estimated Needs: >2.2 L  Current Nutrition:  TPN 8/3 CLD  Plan:  Continue TPN at goal rate of 95 ml/hr, provides 100% of estimated needs, 2220 kcal and 132g  AA.  Electrolytes in TPN: Increase Na 115 mEq/L, K 30 mEq/L (decreased 8/4, =35mEq), Ca 10 mEq/L, Mg 6 mEq/L, Phos 25 mmol/L. Cl:Ac 1:2 Continue famotidine 20mg  to TPN Small increase to regular insulin 22 units in TPN Continue Moderate SSI to TID with meals and adjust as needed  Monitor TPN labs daily until stable, then every Mon/Thurs F/u PO tolerance from clear liquid diets, wean TPN as able, likely 8/6 patient advancing to full liquids. TPN already made for 8/5   Thank you for allowing pharmacy to be a part of this patient's care.  Estill Batten, PharmD, BCCCP  Clinical Pharmacist  Please check AMION for all [facility] Pharm[facility] phone numbers After 10:00 PM, call Main Pharmacy [phone]

## 2023-06-05 NOTE — Progress Notes (Signed)
Mobility Specialist Progress Note:   06/05/23 1500  Mobility  Activity Ambulated with assistance in hallway  Level of Assistance Standby assist, set-up cues, supervision of patient - no hands on  Assistive Device Other (Comment) (IV Pole)  Distance Ambulated (ft) 650 ft  Activity Response Tolerated well  Mobility Referral Yes  $Mobility charge 1 Mobility  Mobility Specialist Start Time (ACUTE ONLY) 1516  Mobility Specialist Stop Time (ACUTE ONLY) 1535  Mobility Specialist Time Calculation (min) (ACUTE ONLY) 19 min    Pre Mobility: 108 HR During Mobility: 117 HR Post Mobility:  112 HR  Pt received in chair, agreeable to mobility. Asymptomatic throughout w/ no c/o. Pt left in chair with call bell and family present.  [name] Mobility Specialist Please contact via Special educational needs teacher or Rehab office at [phone]

## 2023-06-05 NOTE — Progress Notes (Signed)
PROGRESS NOTE    Maria [name]  UUV:[id number] DOB: 1981/01/11 DOA: 05/27/2023 PCP: Deatra James, MD   Brief Narrative:  42 yo F PMH DM on glyburide, depression, who is POD 7 (Op date 7/20) ex lap evac of L hemorrhagic ovarian cyst, and extensive intraabdominal LOA, sigmoid colon serosal tear repair. She remained in the hospital until dc 7/25. Prior to dc she had + BM and + flatus. On 7/27 admited to ICU with hypoglycemia, concern for ileus. S/p re-exploration, hernia reduction, Vicryl mesh underlay and fascial closure.   Assessment & Plan:   Principal Problem:   Ileus (HCC) Active Problems:   Hypoglycemia   Intestinal obstruction (HCC)   Malnutrition of moderate degree  DM2, hypoglycemia -most likely poor intake + vomiting + taking home sulfonylurea, initially on dextrose drip and now on TPN, slightly hyperglycemic.  Blood sugar managed by insulin via TPN.    Hernia with bowel obstruction, fascial dehiscence/leukocytosis/low-grade fever -- s/p ex lap hernia reduction, fascia closure (7/27)  Recent ex lap evac of hemorrhagic ovarian cyst, extensive LOA and sigmoid colon serosal repait (7/20).  TPN for nutrition.  Had yet another episode of fever of 101.2 at 2 AM 06/03/2023.  Afebrile since then.  NG tube pulled out 06/03/2023 by accident, patient is feeling better, no abdominal pain or nausea and is tolerating clear liquid diet, surgery managing, advanced to full liquid diet today.  Having bowel movements as well.   Acute hypoxic respiratory failure: Patient dropped her oxygen saturation and required 2 L of oxygen, chest x-ray suggestive of atelectasis but no edema or consolidation.  This is likely due to pain prohibiting her from taking deep breaths.  She is on room air and comfortable.  Once again encouraged her to use incentive spirometry more often.   AKI -resolved   Low phos -replace in TPN   Anemia Stable.  Monitor daily.  Sinus tachycardia/hypertension: Heart rate and blood  pressure both improved but has mild tachycardia at times.  Tapering beta-blocker, will continue Lopressor 5 mg every 12 hours and reassess tomorrow.    DVT prophylaxis: enoxaparin (LOVENOX) injection 40 mg Start: 06/03/23 1000   Code Status: Full Code  Family Communication:  None present at bedside.  Plan of care discussed with patient in length and he/she verbalized understanding and agreed with it.  Status is: Inpatient Remains inpatient appropriate because: Postop ileus   Estimated body mass index is 38.72 kg/m as calculated from the following:   Height as of this encounter: 4' 9.01" (1.448 m).   Weight as of this encounter: 81.2 kg.    Nutritional Assessment: Body mass index is 38.72 kg/m.Marland Kitchen Seen by dietician.  I agree with the assessment and plan as outlined below: Nutrition Status: Nutrition Problem: Moderate Malnutrition Etiology: acute illness (bowel obstruction, nausea, vomiting) Signs/Symptoms: mild muscle depletion, energy intake < or equal to 50% for > or equal to 5 days Interventions: TPN  . Skin Assessment: I have examined the patient's skin and I agree with the wound assessment as performed by the wound care RN as outlined below:    Consultants:  Gynecology and general surgery  Procedures:  As above  Antimicrobials:  Anti-infectives (From admission, onward)    Start     Dose/Rate Route Frequency Ordered Stop   06/01/23 1400  piperacillin-tazobactam (ZOSYN) IVPB 3.375 g        3.375 g 12.5 mL/hr over 240 Minutes Intravenous Every 8 hours 06/01/23 1312     05/27/23 1545  ceFAZolin (  ANCEF) IVPB 2g/100 mL premix       Placed in "And" Linked Group   2 g 200 mL/hr over 30 Minutes Intravenous On call to O.R. 05/27/23 1539 05/27/23 1655   05/27/23 1545  metroNIDAZOLE (FLAGYL) IVPB 500 mg       Placed in "And" Linked Group   500 mg 100 mL/hr over 60 Minutes Intravenous On call to O.R. 05/27/23 1539 05/27/23 1753   05/27/23 1545  metroNIDAZOLE (FLAGYL) 500  MG/100ML IVPB       Note to Pharmacy: Crissie Sickles: cabinet override      05/27/23 1545 05/27/23 1653   05/27/23 1545  ceFAZolin (ANCEF) 2-4 GM/100ML-% IVPB       Note to Pharmacy: Crissie Sickles: cabinet override      05/27/23 1545 05/27/23 1651         Subjective: Seen and examined.  Improving.  Abdominal pain improving.  No shortness of breath.  No other complaint.  Objective: Vitals:   06/04/23 1951 06/04/23 2339 06/05/23 0336 06/05/23 0757  BP:  113/83 (!) 141/89 127/88  Pulse:  98  (!) 104  Resp: 16 16 16    Temp:  99.1 F (37.3 C) 98.4 F (36.9 C) 99.1 F (37.3 C)  TempSrc:  Oral Oral Oral  SpO2:  100% 98%   Weight:      Height:        Intake/Output Summary (Last 24 hours) at 06/05/2023 1115 Last data filed at 06/05/2023 0336 Gross per 24 hour  Intake 2322.65 ml  Output 75 ml  Net 2247.65 ml   Filed Weights   06/01/23 0443 06/02/23 0410 06/03/23 0717  Weight: 83 kg 83.1 kg 81.2 kg    Examination:  General exam: Appears calm and comfortable  Respiratory system: Clear to auscultation. Respiratory effort normal. Cardiovascular system: S1 & S2 heard, RRR. No JVD, murmurs, rubs, gallops or clicks. No pedal edema. Gastrointestinal system: Abdomen is moderately distended but soft and nontender with abdominal binder in place. No organomegaly or masses felt. Normal bowel sounds heard. Central nervous system: Alert and oriented. No focal neurological deficits. Extremities: Symmetric 5 x 5 power. Skin: No rashes, lesions or ulcers.  Psychiatry: Judgement and insight appear normal. Mood & affect appropriate.   Data Reviewed: I have personally reviewed following labs and imaging studies  CBC: Recent Labs  Lab 05/31/23 0423 06/01/23 0203 06/02/23 0515 06/03/23 0541 06/04/23 0629  WBC 13.1* 14.3* 16.3* 20.0* 18.8*  NEUTROABS  --   --  9.1* 13.8* 11.8*  HGB 10.1* 10.1* 9.5* 9.5* 9.4*  HCT 31.0* 30.6* 28.8* 28.0* 28.3*  MCV 85.2 84.5 83.7 82.6 83.2  PLT 390  404* 398 395 411*   Basic Metabolic Panel: Recent Labs  Lab 05/30/23 0001 05/31/23 0423 06/01/23 0203 06/02/23 0515 06/03/23 0541 06/04/23 0629 06/05/23 0639  NA 135   < > 140 138 136 137 135  K 3.7   < > 3.8 3.7 4.0 4.3 4.2  CL 102   < > 102 104 102 104 100  CO2 24   < > 24 24 24 22 25   GLUCOSE 226*   < > 164* 169* 171* 174* 169*  BUN 15   < > 16 15 16 15 12   CREATININE 0.85   < > 0.72 0.76 0.66 0.67 0.73  CALCIUM 8.1*   < > 8.9 8.4* 8.4* 8.2* 8.3*  MG 2.2  --  2.1  --  2.1  --  2.0  PHOS 1.6*  --  3.3  --  3.8  --  3.5   < > = values in this interval not displayed.   GFR: Estimated Creatinine Clearance: 81.2 mL/min (by C-G formula based on SCr of 0.73 mg/dL). Liver Function Tests: Recent Labs  Lab 05/30/23 0001 06/01/23 0203 06/04/23 0629 06/05/23 0639  AST 27 17 24 30   ALT 21 18 31  48*  ALKPHOS 53 62 146* 155*  BILITOT 0.4 0.4 0.4 0.3  PROT 5.5* 6.1* 5.9* 6.3*  ALBUMIN 2.5* 2.4* 2.1* 2.2*   No results for input(s): "LIPASE", "AMYLASE" in the last 168 hours.  No results for input(s): "AMMONIA" in the last 168 hours. Coagulation Profile: Recent Labs  Lab 06/02/23 1748  INR 1.2   Cardiac Enzymes: No results for input(s): "CKTOTAL", "CKMB", "CKMBINDEX", "TROPONINI" in the last 168 hours. BNP (last 3 results) No results for input(s): "PROBNP" in the last 8760 hours. HbA1C: No results for input(s): "HGBA1C" in the last 72 hours. CBG: Recent Labs  Lab 06/04/23 0300 06/04/23 0607 06/04/23 1342 06/04/23 1703 06/05/23 0623  GLUCAP 171* 153* 141* 162* 174*   Lipid Profile: Recent Labs    06/05/23 0639  TRIG 175*    Thyroid Function Tests: No results for input(s): "TSH", "T4TOTAL", "FREET4", "T3FREE", "THYROIDAB" in the last 72 hours. Anemia Panel: No results for input(s): "VITAMINB12", "FOLATE", "FERRITIN", "TIBC", "IRON", "RETICCTPCT" in the last 72 hours. Sepsis Labs: Recent Labs  Lab 06/01/23 0203 06/01/23 1134  PROCALCITON  --  0.76   LATICACIDVEN 1.2  --     Recent Results (from the past 240 hour(s))  MRSA Next Gen by PCR, Nasal     Status: None   Collection Time: 05/27/23  8:06 PM   Specimen: Nasal Mucosa; Nasal Swab  Result Value Ref Range Status   MRSA by PCR Next Gen NOT DETECTED NOT DETECTED Final    Comment: (NOTE) The GeneXpert MRSA Assay (FDA approved for NASAL specimens only), is one component of a comprehensive MRSA colonization surveillance program. It is not intended to diagnose MRSA infection nor to guide or monitor treatment for MRSA infections. Test performance is not FDA approved in patients less than 69 years old. Performed at [facility], [address], [place], [place] [address]   Culture, blood (Routine X 2) w Reflex to ID Panel     Status: None (Preliminary result)   Collection Time: 06/01/23  1:11 AM   Specimen: BLOOD  Result Value Ref Range Status   Specimen Description BLOOD SITE NOT SPECIFIED  Final   Special Requests   Final    BOTTLES DRAWN AEROBIC AND ANAEROBIC Blood Culture adequate volume   Culture   Final    NO GROWTH 4 DAYS Performed at [facility], [address] [place], [place] [address]    Report Status PENDING  Incomplete  Culture, blood (Routine X 2) w Reflex to ID Panel     Status: None (Preliminary result)   Collection Time: 06/01/23 11:35 AM   Specimen: BLOOD LEFT ARM  Result Value Ref Range Status   Specimen Description BLOOD LEFT ARM  Final   Special Requests   Final    BOTTLES DRAWN AEROBIC AND ANAEROBIC Blood Culture adequate volume   Culture   Final    NO GROWTH 4 DAYS Performed at [facility], [address] [place], [place] [address]    Report Status PENDING  Incomplete  Urine Culture     Status: Abnormal   Collection Time: 06/02/23  10:50 AM   Specimen: Urine, Clean Catch  Result Value Ref Range Status   Specimen Description URINE, CLEAN CATCH  Final   Special Requests NONE  Final   Culture (A)  Final    <10,000  COLONIES/mL INSIGNIFICANT GROWTH Performed at [facility], [address] [place], [place] [address]    Report Status 06/03/2023 FINAL  Final  Aerobic/Anaerobic Culture w Gram Stain (surgical/deep wound)     Status: None (Preliminary result)   Collection Time: 06/02/23  8:19 PM   Specimen: Abscess  Result Value Ref Range Status   Specimen Description ABSCESS ABDOMEN  Final   Special Requests NONE  Final   Gram Stain   Final    FEW WBC PRESENT, PREDOMINANTLY PMN NO ORGANISMS SEEN    Culture   Final    NO GROWTH 3 DAYS NO ANAEROBES ISOLATED; CULTURE IN PROGRESS FOR 5 DAYS Performed at [facility], [address], [place], [place] [address]    Report Status PENDING  Incomplete     Radiology Studies: No results found.  Scheduled Meds:  acetaminophen (TYLENOL) oral liquid 160 mg/5 mL  650 mg Oral Q6H   Chlorhexidine Gluconate Cloth  6 each Topical Daily   cyclobenzaprine  5 mg Oral TID   enoxaparin (LOVENOX) injection  40 mg Subcutaneous Daily   escitalopram  20 mg Oral Daily   insulin aspart  0-15 Units Subcutaneous TID WC   metoprolol tartrate  5 mg Intravenous Q12H   sodium chloride flush  10-40 mL Intracatheter Q12H   Continuous Infusions:  sodium chloride 10 mL/hr (05/30/23 1552)   piperacillin-tazobactam (ZOSYN)  IV 3.375 g (06/05/23 6644)   TPN ADULT (ION) 95 mL/hr at 06/05/23 0336   TPN ADULT (ION)       LOS: 9 days   Hughie Closs, MD [facility]  06/05/2023, 11:15 AM   *Please note that this is a verbal dictation therefore any spelling or grammatical errors are due to the "[employer]agon [employer] One" system interpretation.  Please page via Amion and do not message via secure chat for urgent patient care matters. Secure chat can be used for non urgent patient care matters.  How to contact the [facility] Attending or Consulting provider 7A - 7P or covering provider during after hours 7P -7A, for this patient?  Check the care team in [facility] and look for a)  attending/consulting TRH provider listed and b) the [facility] team listed. Page or secure chat 7A-7P. Log into [email or web address] and use Marissa's universal password to access. If you do not have the password, please contact the hospital operator. Locate the [facility] provider you are looking for under [facility] and page to a number that you can be directly reached. If you still have difficulty reaching the provider, please page the [facility] (Director on Call) for the Hospitalists listed on amion for assistance.

## 2023-06-05 NOTE — Progress Notes (Signed)
Progress Note  9 Days Post-Op  Subjective: Tolerating CLD, having bowel function. Some incontinence of stool currently. Denies nausea or vomiting. Throat is sore from NGT and having trouble with swallowing larger pills.   Objective: Vital signs in last 24 hours: Temp:  [98.2 F (36.8 C)-99.2 F (37.3 C)] 99.1 F (37.3 C) (08/05 0757) Pulse Rate:  [98-106] 104 (08/05 0757) Resp:  [16-18] 16 (08/05 0336) BP: (113-141)/(81-90) 127/88 (08/05 0757) SpO2:  [98 %-100 %] 98 % (08/05 0336) Last BM Date : 06/03/23  Intake/Output from previous day: 08/04 0701 - 08/05 0700 In: 2322.7 [I.V.:2113; IV Piggyback:209.6] Out: 185 [Drains:185] Intake/Output this shift: No intake/output data recorded.  PE: General: Alert, NAD Heart: sinus tachy Lungs: Respiratory effort nonlabored on room air Abd: appropriately ttp, IR drain with SS fluid, surgical drain in SQ with cloudy fluid that appears like liquefying hematoma, incision with central opening and healthy granulation, tract extends 3 cm superiorly    Lab Results:  Recent Labs    06/03/23 0541 06/04/23 0629  WBC 20.0* 18.8*  HGB 9.5* 9.4*  HCT 28.0* 28.3*  PLT 395 411*   BMET Recent Labs    06/04/23 0629 06/05/23 0639  NA 137 135  K 4.3 4.2  CL 104 100  CO2 22 25  GLUCOSE 174* 169*  BUN 15 12  CREATININE 0.67 0.73  CALCIUM 8.2* 8.3*   PT/INR Recent Labs    06/02/23 1748  LABPROT 15.5*  INR 1.2   CMP     Component Value Date/Time   NA 135 06/05/2023 0639   K 4.2 06/05/2023 0639   CL 100 06/05/2023 0639   CO2 25 06/05/2023 0639   GLUCOSE 169 (H) 06/05/2023 0639   BUN 12 06/05/2023 0639   CREATININE 0.73 06/05/2023 0639   CALCIUM 8.3 (L) 06/05/2023 0639   PROT 6.3 (L) 06/05/2023 0639   ALBUMIN 2.2 (L) 06/05/2023 0639   AST 30 06/05/2023 0639   ALT 48 (H) 06/05/2023 0639   ALKPHOS 155 (H) 06/05/2023 0639   BILITOT 0.3 06/05/2023 0639   GFRNONAA >60 06/05/2023 0639   GFRAA >60 05/31/2018 1753   Lipase      Component Value Date/Time   LIPASE 38 05/27/2023 1243       Studies/Results: No results found.  Anti-infectives: Anti-infectives (From admission, onward)    Start     Dose/Rate Route Frequency Ordered Stop   06/01/23 1400  piperacillin-tazobactam (ZOSYN) IVPB 3.375 g        3.375 g 12.5 mL/hr over 240 Minutes Intravenous Every 8 hours 06/01/23 1312     05/27/23 1545  ceFAZolin (ANCEF) IVPB 2g/100 mL premix       Placed in "And" Linked Group   2 g 200 mL/hr over 30 Minutes Intravenous On call to O.R. 05/27/23 1539 05/27/23 1655   05/27/23 1545  metroNIDAZOLE (FLAGYL) IVPB 500 mg       Placed in "And" Linked Group   500 mg 100 mL/hr over 60 Minutes Intravenous On call to O.R. 05/27/23 1539 05/27/23 1753   05/27/23 1545  metroNIDAZOLE (FLAGYL) 500 MG/100ML IVPB       Note to Pharmacy: Crissie Sickles: cabinet override      05/27/23 1545 05/27/23 1653   05/27/23 1545  ceFAZolin (ANCEF) 2-4 GM/100ML-% IVPB       Note to Pharmacy: Crissie Sickles: cabinet override      05/27/23 1545 05/27/23 1651        Assessment/Plan  S/p  ex-lap with extensive LOA, evacuation of hemorrhagic ovarian cyst and serosal repair of colon 05/20/23  Fascial dehiscence of Pfannenstiel incision and small bowel herniation with obstruction  POD#9 - s/p re-exploration, hernia reduction, vicryl mesh underlay and fascial closure  7/27 - clear liquid diet, TNA - CT 8/1 with intraperitoneal collection in pelvis - s/p IR drain 8/2 with peach colored fluid evacuated, NGTD - IV Zosyn, repeat CBC in AM - binder and continue drain in SQ, monitoring incision closely - two staples removed and SQ opened and packed - continue wound care, dressing changes, drainage - mobilize as able   FEN: FLD and ADAT to soft, wean TPN VTE: lovenox ID: [email or web address]   - per primary attending -  T2DM AKI on CKD   LOS: 9 days     Juliet Rude, [facility] 06/05/2023, 9:54 AM Please see Amion for pager  number during day hours 7:00am-4:30pm

## 2023-06-05 NOTE — Progress Notes (Signed)
Physical Therapy Treatment Patient Details Name: Maria [name] MRN: [id number] DOB: [birth year] Today's Date: 06/05/2023   History of Present Illness 42 yo female admitted 7/27 with N/V, ileus. Fascial dehiscence of incision and small bowel herniation with obstruction requiring re-exploration, hernia reduction, vicryl mesh underlay and fascial closure. PMHx: Pt admission 7/19-7/25/24 with lt flank pain. 7/20 S/p ex-lap with evacuation of hemorrhagic ovarian cyst and serosal repair of colon. Migraine, CKD, DM    PT Comments  Pt received sitting EOB and agreeable to session. Pt able to tolerate increased gait distance this session, however continues to use IV pole and demonstrate flexed posture due to abdominal discomfort. Pt requesting to use the bathroom at the end of the session and reports concerns about the urgency and decreased muscle control when she needs to use the bathroom. Pt able to perform pericare in standing with mod I. Pt continues to benefit from PT services to progress toward functional mobility goals.    If plan is discharge home, recommend the following: A little help with walking and/or transfers;Assistance with cooking/housework;Assist for transportation;Help with stairs or ramp for entrance   Can travel by private vehicle        Equipment Recommendations  BSC/3in1    Recommendations for Other Services       Precautions / Restrictions Precautions Precautions: Other (comment) Precaution Comments: abdominal binder, jp drain Restrictions Weight Bearing Restrictions: No     Mobility  Bed Mobility               General bed mobility comments: Pt sitting EOB upon arrival    Transfers Overall transfer level: Needs assistance Equipment used: None Transfers: Sit to/from Stand Sit to Stand: Supervision           General transfer comment: From low EOB    Ambulation/Gait Ambulation/Gait assistance: Supervision Gait Distance (Feet): 615 Feet Assistive  device: IV Pole Gait Pattern/deviations: Step-through pattern, Decreased stride length, Trunk flexed       General Gait Details: Pt demonstrating a slow, steady step-through pattern with trunk flexion for abdominal bracing due to discomfort. no overt LOB or unsteadiness with IV pole support.       Balance Overall balance assessment: Mild deficits observed, not formally tested                                          Cognition Arousal/Alertness: Awake/alert Behavior During Therapy: WFL for tasks assessed/performed Overall Cognitive Status: Within Functional Limits for tasks assessed                                          Exercises      General Comments        Pertinent Vitals/Pain Pain Assessment Pain Assessment: Faces Faces Pain Scale: Hurts little more Pain Location: abdomen Pain Descriptors / Indicators: Grimacing, Guarding, Discomfort Pain Intervention(s): Monitored during session, Repositioned     PT Goals (current goals can now be found in the care plan section) Acute Rehab PT Goals Patient Stated Goal: Go to her sisters house on discharge PT Goal Formulation: With patient Time For Goal Achievement: 06/12/23 Potential to Achieve Goals: Good Progress towards PT goals: Progressing toward goals    Frequency    Min 1X/week      PT Plan Current plan  remains appropriate       AM-PAC PT "6 Clicks" Mobility   Outcome Measure  Help needed turning from your back to your side while in a flat bed without using bedrails?: A Little Help needed moving from lying on your back to sitting on the side of a flat bed without using bedrails?: A Little Help needed moving to and from a bed to a chair (including a wheelchair)?: A Little Help needed standing up from a chair using your arms (e.g., wheelchair or bedside chair)?: A Little Help needed to walk in hospital room?: A Little Help needed climbing 3-5 steps with a railing? : A  Little 6 Click Score: 18    End of Session   Activity Tolerance: Patient tolerated treatment well Patient left: with call bell/phone within reach;in chair Nurse Communication: Mobility status PT Visit Diagnosis: Other abnormalities of gait and mobility (R26.89)     Time: 0981-1914 PT Time Calculation (min) (ACUTE ONLY): 28 min  Charges:    $Gait Training: 8-22 mins $Therapeutic Activity: 8-22 mins PT General Charges $$ ACUTE PT VISIT: 1 Visit                    Johny Shock, PTA Acute Rehabilitation Services Secure Chat Preferred  [phone]    Johny Shock 06/05/2023, 11:33 AM

## 2023-06-06 DIAGNOSIS — Z0289 Encounter for other administrative examinations: Secondary | ICD-10-CM

## 2023-06-06 DIAGNOSIS — K567 Ileus, unspecified: Secondary | ICD-10-CM | POA: Diagnosis not present

## 2023-06-06 LAB — GLUCOSE, CAPILLARY
Glucose-Capillary: 186 mg/dL — ABNORMAL HIGH (ref 70–99)
Glucose-Capillary: 170 mg/dL — ABNORMAL HIGH (ref 70–99)
Glucose-Capillary: 147 mg/dL — ABNORMAL HIGH (ref 70–99)

## 2023-06-06 LAB — BASIC METABOLIC PANEL
Anion gap: 11 (ref 5–15)
BUN: 13 mg/dL (ref 6–20)
CO2: 24 mmol/L (ref 22–32)
Calcium: 8.5 mg/dL — ABNORMAL LOW (ref 8.9–10.3)
Chloride: 101 mmol/L (ref 98–111)
Creatinine, Ser: 0.71 mg/dL (ref 0.44–1.00)
GFR, Estimated: >60 mL/min (ref >60)
Glucose, Bld: 174 mg/dL — ABNORMAL HIGH (ref 70–99)
Potassium: 4.3 mmol/L (ref 3.5–5.1)
Sodium: 136 mmol/L (ref 135–145)

## 2023-06-06 MED ORDER — ADULT MULTIVITAMIN W/MINERALS CH
1.0000 | ORAL_TABLET | Freq: Every day | ORAL | Status: DC
  Administered 2023-06-07 – 2023-06-09 (×3): 1 via ORAL
  Filled 2023-06-06 (×3): qty 1

## 2023-06-06 MED ORDER — HYDROMORPHONE HCL 1 MG/ML IJ SOLN
2.0000 mg | Freq: Once | INTRAMUSCULAR | Status: AC
  Administered 2023-06-06: 2 mg via INTRAVENOUS
  Filled 2023-06-06: qty 2

## 2023-06-06 NOTE — Progress Notes (Signed)
Mobility Specialist Progress Note:   06/06/23 1400  Mobility  Activity Ambulated with assistance in hallway  Level of Assistance Modified independent, requires aide device or extra time  Assistive Device Other (Comment) (IV Pole)  Distance Ambulated (ft) 650 ft  Activity Response Tolerated well  Mobility Referral Yes  $Mobility charge 1 Mobility  Mobility Specialist Start Time (ACUTE ONLY) 1432  Mobility Specialist Stop Time (ACUTE ONLY) 1447  Mobility Specialist Time Calculation (min) (ACUTE ONLY) 15 min    Pre Mobility: 112 HR Post Mobility:  104 HR  Pt received in chair, eager to mobility. Asymptomatic throughout session. Pt left in chair with call bell and RN present.  [name] Mobility Specialist Please contact via Special educational needs teacher or Rehab office at [phone]

## 2023-06-06 NOTE — Progress Notes (Signed)
Gynecology Consult Progress Note  Admission Date: 05/27/2023 Current Date: 06/06/2023 1:51 PM  Maria [name] is a 42 y.o. re-admitted for large fascial dehiscence after 7/20 diagnostic laparoscopy convered to ex-lap/umbilical hernia repair/ovarian cyst drainage after GYN & Gen Surgery combination surgery. She underwent 7/27 fascial closure with mesh.    History complicated by: Patient Active Problem List   Diagnosis Date Noted   Malnutrition of moderate degree 05/29/2023   Hypoglycemia 05/28/2023   Intestinal obstruction (HCC) 05/28/2023   Ileus (HCC) 05/27/2023   Pelvic pain in female 05/19/2023   Chronic migraine 08/21/2018   Memory loss 08/21/2018    Subjective:  Patient sitting up in chair and comfortable. Feeling better. +ambulation  Objective:    Current Vital Signs 24h Vital Sign Ranges  T 97.6 F (36.4 C) Temp  Avg: 98.4 F (36.9 C)  Min: 97.6 F (36.4 C)  Max: 98.9 F (37.2 C)  BP 111/71 BP  Min: 111/71  Max: 135/88  HR 97 Pulse  Avg: 101.2  Min: 97  Max: 107  RR 14 Resp  Avg: 16.7  Min: 14  Max: 18  SaO2 98 % Room Air SpO2  Avg: 98.3 %  Min: 96 %  Max: 100 %       24 Hour I/O Current Shift I/O  Time Ins Outs 08/05 0701 - 08/06 0700 In: 1842.4 [P.O.:120; I.V.:1535.4] Out: 75 [Drains:75] No intake/output data recorded.   Patient Vitals for the past 24 hrs:  BP Temp Temp src Pulse Resp SpO2 Weight  06/06/23 1111 111/71 97.6 F (36.4 C) Oral 97 14 98 % --  06/06/23 0751 131/89 98.6 F (37 C) Oral 100 16 100 % --  06/06/23 0558 -- -- -- -- -- -- 80 kg  06/06/23 0345 129/81 98.2 F (36.8 C) Oral (!) 103 18 98 % --  06/05/23 2350 129/89 98.7 F (37.1 C) Oral 97 16 98 % --  06/05/23 2050 128/88 98.6 F (37 C) Oral (!) 107 18 96 % --  06/05/23 1618 135/88 98.9 F (37.2 C) Oral (!) 103 18 100 % --    Physical exam: General appearance: alert, cooperative, and appears stated age Lungs:  no respiratory distress Psych: appropriate Neurologic: Mental status:  Alert, oriented, thought content appropriate  Medications Current Facility-Administered Medications  Medication Dose Route Frequency Provider Last Rate Last Admin   0.9 %  sodium chloride infusion   Intravenous PRN Mannam, Praveen, MD 10 mL/hr at 05/30/23 1552 10 mL/hr at 05/30/23 1552   acetaminophen (TYLENOL) 160 MG/5ML solution 650 mg  650 mg Oral Q6H Johnson, Kelly R, PA-C   650 mg at 06/06/23 1145   Chlorhexidine Gluconate Cloth 2 % PADS 6 each  6 each Topical Daily Lynnell Catalan, MD   6 each at 06/06/23 0847   cyclobenzaprine (FLEXERIL) tablet 5 mg  5 mg Oral TID Trixie Deis R, PA-C   5 mg at 06/06/23 1146   dextrose 50 % solution 25 g  25 g Intravenous Q1H PRN Agarwala, Daleen Bo, MD   25 g at 05/27/23 1640   enoxaparin (LOVENOX) injection 40 mg  40 mg Subcutaneous Daily Pahwani, Daleen Bo, MD   40 mg at 06/06/23 1147   escitalopram (LEXAPRO) tablet 20 mg  20 mg Oral Daily Trixie Deis R, PA-C   20 mg at 06/06/23 1146   feeding supplement (ENSURE ENLIVE / ENSURE PLUS) liquid 237 mL  237 mL Oral BID BM Hughie Closs, MD   237 mL at 06/06/23 0848  HYDROmorphone (DILAUDID) injection 0.5 mg  0.5 mg Intravenous Q4H PRN Juliet Rude, PA-C       hydrOXYzine (ATARAX) tablet 25 mg  25 mg Oral Q8H PRN Juliet Rude, PA-C       insulin aspart (novoLOG) injection 0-15 Units  0-15 Units Subcutaneous TID WC Alphia Moh D, RPH   3 Units at 06/06/23 9323   labetalol (NORMODYNE) injection 10 mg  10 mg Intravenous Q2H PRN Hughie Closs, MD       menthol-cetylpyridinium (CEPACOL) lozenge 3 mg  1 lozenge Oral PRN Myna Hidalgo, DO       metoprolol tartrate (LOPRESSOR) injection 5 mg  5 mg Intravenous Q12H Hughie Closs, MD   5 mg at 06/06/23 1146   [START ON 06/07/2023] multivitamin with minerals tablet 1 tablet  1 tablet Oral Daily Francena Hanly, RPH       ondansetron [facility]) injection 4 mg  4 mg Intravenous Q6H PRN Lynnell Catalan, MD   4 mg at 06/02/23 2343   Oral care mouth rinse  15 mL Mouth  Rinse PRN Marlin Canary U, DO       oxyCODONE (Oxy IR/ROXICODONE) immediate release tablet 5-10 mg  5-10 mg Oral Q4H PRN Meuth, Brooke A, PA-C   10 mg at 06/05/23 1013   piperacillin-tazobactam (ZOSYN) IVPB 3.375 g  3.375 g Intravenous Q8H Trixie Deis R, PA-C 12.5 mL/hr at 06/06/23 0617 3.375 g at 06/06/23 0617   sodium chloride flush (NS) 0.9 % injection 10-40 mL  10-40 mL Intracatheter Q12H Agarwala, Daleen Bo, MD   10 mL at 06/06/23 0900   sodium chloride flush (NS) 0.9 % injection 10-40 mL  10-40 mL Intracatheter PRN Lynnell Catalan, MD   10 mL at 06/06/23 0848   TPN ADULT (ION)   Intravenous Continuous TPN Francena Hanly, RPH 95 mL/hr at 06/05/23 1748 New Bag at 06/05/23 1748   Labs  8/2 drain cultures NGTD, few WBC present 8/2 ucx negative 8/1 BCx NGTD  Recent Labs  Lab 06/03/23 0541 06/04/23 0629 06/06/23 0623  WBC 20.0* 18.8* 19.4*  HGB 9.5* 9.4* 10.1*  HCT 28.0* 28.3* 30.2*  PLT 395 411* 532*    Recent Labs  Lab 06/01/23 0203 06/02/23 0515 06/04/23 0629 06/05/23 0639 06/06/23 0845  NA 140   < > 137 135 136  K 3.8   < > 4.3 4.2 4.3  CL 102   < > 104 100 101  CO2 24   < > 22 25 24   BUN 16   < > 15 12 13   CREATININE 0.72   < > 0.67 0.73 0.71  CALCIUM 8.9   < > 8.2* 8.3* 8.5*  PROT 6.1*  --  5.9* 6.3*  --   BILITOT 0.4  --  0.4 0.3  --   ALKPHOS 62  --  146* 155*  --   ALT 18  --  31 48*  --   AST 17  --  24 30  --   GLUCOSE 164*   < > 174* 169* 174*   < > = values in this interval not displayed.   Radiology No new imaging  Assessment & Plan:  Patient improving  GYN team signing off; please call with any GYN concerns or questions.   Code Status: Full Code   Cornelia Copa MD Attending Center for [facility]) GYN Consult Phone: [phone] (M-F, 0800-1700) & [phone] (Off hours, weekends, holidays)

## 2023-06-06 NOTE — Progress Notes (Signed)
Progress Note  10 Days Post-Op  Subjective: Tolerating soft diet and having loose stools still. More drainage from incision. Pain overall pretty well controlled yesterday.   Objective: Vital signs in last 24 hours: Temp:  [98.2 F (36.8 C)-99 F (37.2 C)] 98.6 F (37 C) (08/06 0751) Pulse Rate:  [97-107] 100 (08/06 0751) Resp:  [16-18] 16 (08/06 0751) BP: (123-135)/(81-91) 131/89 (08/06 0751) SpO2:  [96 %-100 %] 100 % (08/06 0751) Weight:  [80 kg] 80 kg (08/06 0558) Last BM Date : 06/06/23  Intake/Output from previous day: 08/05 0701 - 08/06 0700 In: 1842.4 [P.O.:120; I.V.:1535.4; IV Piggyback:187.1] Out: 75 [Drains:75] Intake/Output this shift: No intake/output data recorded.  PE: General: Alert, NAD Heart: sinus tachy Lungs: Respiratory effort nonlabored on room air Abd: appropriately ttp, IR drain with SS fluid, surgical drain in SQ with cloudy fluid that appears like liquefying hematoma; incision with central opening and healthy granulation, removed more staples from longitudinal incision and more drainage evacuated from inferior, one staple removed from left lateral incision and thin clear drainage expressed   Lab Results:  Recent Labs    06/04/23 0629 06/06/23 0623  WBC 18.8* 19.4*  HGB 9.4* 10.1*  HCT 28.3* 30.2*  PLT 411* 532*   BMET Recent Labs    06/05/23 0639 06/06/23 0845  NA 135 136  K 4.2 4.3  CL 100 101  CO2 25 24  GLUCOSE 169* 174*  BUN 12 13  CREATININE 0.73 0.71  CALCIUM 8.3* 8.5*   PT/INR No results for input(s): "LABPROT", "INR" in the last 72 hours.  CMP     Component Value Date/Time   NA 136 06/06/2023 0845   K 4.3 06/06/2023 0845   CL 101 06/06/2023 0845   CO2 24 06/06/2023 0845   GLUCOSE 174 (H) 06/06/2023 0845   BUN 13 06/06/2023 0845   CREATININE 0.71 06/06/2023 0845   CALCIUM 8.5 (L) 06/06/2023 0845   PROT 6.3 (L) 06/05/2023 0639   ALBUMIN 2.2 (L) 06/05/2023 0639   AST 30 06/05/2023 0639   ALT 48 (H) 06/05/2023  0639   ALKPHOS 155 (H) 06/05/2023 0639   BILITOT 0.3 06/05/2023 0639   GFRNONAA >60 06/06/2023 0845   GFRAA >60 05/31/2018 1753   Lipase     Component Value Date/Time   LIPASE 38 05/27/2023 1243       Studies/Results: No results found.  Anti-infectives: Anti-infectives (From admission, onward)    Start     Dose/Rate Route Frequency Ordered Stop   06/01/23 1400  piperacillin-tazobactam (ZOSYN) IVPB 3.375 g        3.375 g 12.5 mL/hr over 240 Minutes Intravenous Every 8 hours 06/01/23 1312     05/27/23 1545  ceFAZolin (ANCEF) IVPB 2g/100 mL premix       Placed in "And" Linked Group   2 g 200 mL/hr over 30 Minutes Intravenous On call to O.R. 05/27/23 1539 05/27/23 1655   05/27/23 1545  metroNIDAZOLE (FLAGYL) IVPB 500 mg       Placed in "And" Linked Group   500 mg 100 mL/hr over 60 Minutes Intravenous On call to O.R. 05/27/23 1539 05/27/23 1753   05/27/23 1545  metroNIDAZOLE (FLAGYL) 500 MG/100ML IVPB       Note to Pharmacy: Crissie Sickles: cabinet override      05/27/23 1545 05/27/23 1653   05/27/23 1545  ceFAZolin (ANCEF) 2-4 GM/100ML-% IVPB       Note to Pharmacy: Crissie Sickles: cabinet override  05/27/23 1545 05/27/23 1651        Assessment/Plan  S/p  ex-lap with extensive LOA, evacuation of hemorrhagic ovarian cyst and serosal repair of colon 05/20/23  Fascial dehiscence of Pfannenstiel incision and small bowel herniation with obstruction  POD#10 - s/p re-exploration, hernia reduction, vicryl mesh underlay and fascial closure  7/27 - adv to CM diet today, ok to wean TPN still - CT 8/1 with intraperitoneal collection in pelvis - s/p IR drain 8/2 with peach colored fluid evacuated, NGTD - IV Zosyn, WBC remains elevated at 19 - if still not really trending down tomorrow likely repeat CT AP tomorrow  - binder and continue drain in SQ, monitoring incision closely - more staples removed, BID wet to dry  - continue wound care, dressing changes, drainage - mobilize  as able   FEN: CM diet, wean TPN VTE: lovenox ID: [email or web address]   - per primary attending -  T2DM AKI on CKD   LOS: 10 days     Juliet Rude, [facility] Surgery 06/06/2023, 9:52 AM Please see Amion for pager number during day hours 7:00am-4:30pm

## 2023-06-06 NOTE — Progress Notes (Signed)
PHARMACY - TOTAL PARENTERAL NUTRITION CONSULT NOTE   Indication: Prolonged ileus  Patient Measurements: Height: 4' 9.01" (144.8 cm) Weight: 80 kg (176 lb 4.8 oz) IBW/kg (Calculated) : 38.62 TPN AdjBW (KG): 47.1 Body mass index is 38.14 kg/m. Usual Weight: patient reports she is typically 193 lbs (87.7 kg)  Assessment:  41yo female who presented 7/27 following previous admission 7/20-25 where she underwent exlap with lysis of adhesions, serosa repair, evacuation of hemorrhagic ovarian cyst. Diet was advanced to normal over course of admission with emesis POD 3-4 but overall tolerated with normal bowel sounds, BM and flatus prior to discharge 7/25. Pt began to experience abdominal distension and cramping 7/26 that progressed to vomiting 7/27 and has been unable to maintain PO intake. Underwent re-exlap 7/27, hernia reduction, Vicryl mesh underlay, fascial closure. Pharmacy is consulted to initiate TPN.   Glucose / Insulin: BG 141-174, used 8 units SSI/24h SSI and 20 units in TPN Electrolytes: CoCa 9.9, Na: 136, steadily down-trending, others WNL Renal: Scr WNL, BUN WNL Hepatic: Alk  phos155, AST/ALT/Tbili wnl, TG 175, Albumin 2.2 Intake / Output; MIVF: UOP x5 charted, stool x3, -75mL drain output  GI Imaging:  8/1 CT A/P: SBO vs. post-op ileus, intraperitoneal collection in pelvis GI Surgeries / Procedures:  7/27 ex lap, hernia reduction, Vicryl mesh underlay, fascial closure. 8/2 drain to abdominal fluid collection 8/3 NGT removed accidnetally   Central access: PICC TPN start date: 7/29    Nutritional Goals:  Goal rate TPN 95 ml/hr to provide 132 g protein and 2221 kcal  RD Assessment: Estimated Needs Total Energy Estimated Needs: 2200-2400 Total Protein Estimated Needs: 125-145 grams Total Fluid Estimated Needs: >2.2 L  Current Nutrition:  TPN 8/3 CLD 8/5 FLD  8/6 diet heart healthy   Plan:  Discussed with surgery, will wean TPN to off tonight.  Wean TPN at 16:00 to  36mL/hr and then to off at 18:00.  Sliding scale insulin already adjusted to TID with meals.  Will add PO multivitamin.   Thank you for allowing pharmacy to be a part of this patient's care.  Estill Batten, PharmD, BCCCP  Clinical Pharmacist  Please check AMION for all [facility] Pharmacy phone numbers After 10:00 PM, call Main Pharmacy [phone]

## 2023-06-06 NOTE — Progress Notes (Signed)
PROGRESS NOTE    Maria [name]  GMW:[id number] DOB: 02/18/1981 DOA: 05/27/2023 PCP: Deatra James, MD   Brief Narrative:  42 yo F PMH DM on glyburide, depression, who is POD 7 (Op date 7/20) ex lap evac of L hemorrhagic ovarian cyst, and extensive intraabdominal LOA, sigmoid colon serosal tear repair. She remained in the hospital until dc 7/25. Prior to dc she had + BM and + flatus. On 7/27 admited to ICU with hypoglycemia, concern for ileus. S/p re-exploration, hernia reduction, Vicryl mesh underlay and fascial closure.   Assessment & Plan:   Principal Problem:   Ileus (HCC) Active Problems:   Hypoglycemia   Intestinal obstruction (HCC)   Malnutrition of moderate degree  DM2, hypoglycemia -most likely poor intake + vomiting + taking home sulfonylurea, initially on dextrose drip and now on TPN, slightly hyperglycemic.  Blood sugar managed by insulin via TPN.    Hernia with bowel obstruction, fascial dehiscence/leukocytosis/low-grade fever -- s/p ex lap hernia reduction, fascia closure (7/27)  Recent ex lap evac of hemorrhagic ovarian cyst, extensive LOA and sigmoid colon serosal repait (7/20).  TPN for nutrition.  Had yet another episode of fever of 101.2 at 2 AM 06/03/2023.  Afebrile since then.  NG tube pulled out 06/03/2023 by accident, patient is feeling better, abdominal pain is improving, no nausea.  Tolerating full liquid diet.  General surgery managing, they are advancing diet to soft diet.  The plan to order CT abdomen and pelvis tomorrow if no improvement in leukocytosis.   Acute hypoxic respiratory failure: Patient dropped her oxygen saturation and required 2 L of oxygen, chest x-ray suggestive of atelectasis but no edema or consolidation.  This is likely due to pain prohibiting her from taking deep breaths.  She is on room air and comfortable.  Once again encouraged her to use incentive spirometry more often.   AKI -resolved   Low phos -replace in TPN   Anemia Stable.   Monitor daily.  Sinus tachycardia/hypertension: Heart rate and blood pressure both improved but has mild tachycardia at times.  Tapering beta-blocker, will continue Lopressor 5 mg every 12 hours and reassess tomorrow.    DVT prophylaxis: enoxaparin (LOVENOX) injection 40 mg Start: 06/03/23 1000   Code Status: Full Code  Family Communication:  None present at bedside.  Plan of care discussed with patient in length and he/she verbalized understanding and agreed with it.  Status is: Inpatient Remains inpatient appropriate because: Postop ileus   Estimated body mass index is 38.14 kg/m as calculated from the following:   Height as of this encounter: 4' 9.01" (1.448 m).   Weight as of this encounter: 80 kg.    Nutritional Assessment: Body mass index is 38.14 kg/m.Marland Kitchen Seen by dietician.  I agree with the assessment and plan as outlined below: Nutrition Status: Nutrition Problem: Moderate Malnutrition Etiology: acute illness (bowel obstruction, nausea, vomiting) Signs/Symptoms: mild muscle depletion, energy intake < or equal to 50% for > or equal to 5 days Interventions: TPN  . Skin Assessment: I have examined the patient's skin and I agree with the wound assessment as performed by the wound care RN as outlined below:    Consultants:  Gynecology and general surgery  Procedures:  As above  Antimicrobials:  Anti-infectives (From admission, onward)    Start     Dose/Rate Route Frequency Ordered Stop   06/01/23 1400  piperacillin-tazobactam (ZOSYN) IVPB 3.375 g        3.375 g 12.5 mL/hr over 240 Minutes Intravenous Every  8 hours 06/01/23 1312     05/27/23 1545  ceFAZolin (ANCEF) IVPB 2g/100 mL premix       Placed in "And" Linked Group   2 g 200 mL/hr over 30 Minutes Intravenous On call to O.R. 05/27/23 1539 05/27/23 1655   05/27/23 1545  metroNIDAZOLE (FLAGYL) IVPB 500 mg       Placed in "And" Linked Group   500 mg 100 mL/hr over 60 Minutes Intravenous On call to O.R. 05/27/23  1539 05/27/23 1753   05/27/23 1545  metroNIDAZOLE (FLAGYL) 500 MG/100ML IVPB       Note to Pharmacy: Crissie Sickles: cabinet override      05/27/23 1545 05/27/23 1653   05/27/23 1545  ceFAZolin (ANCEF) 2-4 GM/100ML-% IVPB       Note to Pharmacy: Crissie Sickles: cabinet override      05/27/23 1545 05/27/23 1651         Subjective: Seen and examined.  No major complaint.  Surgical PA at the bedside changing the dressing.  Objective: Vitals:   06/05/23 2350 06/06/23 0345 06/06/23 0558 06/06/23 0751  BP: 129/89 129/81  131/89  Pulse: 97 (!) 103  100  Resp: 16 18  16   Temp: 98.7 F (37.1 C) 98.2 F (36.8 C)  98.6 F (37 C)  TempSrc: Oral Oral  Oral  SpO2: 98% 98%  100%  Weight:   80 kg   Height:        Intake/Output Summary (Last 24 hours) at 06/06/2023 1113 Last data filed at 06/06/2023 0216 Gross per 24 hour  Intake 1842.44 ml  Output 75 ml  Net 1767.44 ml   Filed Weights   06/02/23 0410 06/03/23 0717 06/06/23 0558  Weight: 83.1 kg 81.2 kg 80 kg    Examination:  General exam: Appears calm and comfortable  Respiratory system: Clear to auscultation. Respiratory effort normal. Cardiovascular system: S1 & S2 heard, RRR. No JVD, murmurs, rubs, gallops or clicks. No pedal edema. Gastrointestinal system: Abdomen is nondistended, soft and generalized tenderness. No organomegaly or masses felt. Normal bowel sounds heard. Central nervous system: Alert and oriented. No focal neurological deficits. Extremities: Symmetric 5 x 5 power. Skin: No rashes, lesions or ulcers.  Psychiatry: Judgement and insight appear normal. Mood & affect appropriate.    Data Reviewed: I have personally reviewed following labs and imaging studies  CBC: Recent Labs  Lab 06/01/23 0203 06/02/23 0515 06/03/23 0541 06/04/23 0629 06/06/23 0623  WBC 14.3* 16.3* 20.0* 18.8* 19.4*  NEUTROABS  --  9.1* 13.8* 11.8*  --   HGB 10.1* 9.5* 9.5* 9.4* 10.1*  HCT 30.6* 28.8* 28.0* 28.3* 30.2*  MCV 84.5  83.7 82.6 83.2 85.1  PLT 404* 398 395 411* 532*   Basic Metabolic Panel: Recent Labs  Lab 06/01/23 0203 06/02/23 0515 06/03/23 0541 06/04/23 0629 06/05/23 0639 06/06/23 0845  NA 140 138 136 137 135 136  K 3.8 3.7 4.0 4.3 4.2 4.3  CL 102 104 102 104 100 101  CO2 24 24 24 22 25 24   GLUCOSE 164* 169* 171* 174* 169* 174*  BUN 16 15 16 15 12 13   CREATININE 0.72 0.76 0.66 0.67 0.73 0.71  CALCIUM 8.9 8.4* 8.4* 8.2* 8.3* 8.5*  MG 2.1  --  2.1  --  2.0  --   PHOS 3.3  --  3.8  --  3.5  --    GFR: Estimated Creatinine Clearance: 80.6 mL/min (by C-G formula based on SCr of 0.71 mg/dL). Liver Function Tests:  Recent Labs  Lab 06/01/23 0203 06/04/23 0629 06/05/23 0639  AST 17 24 30   ALT 18 31 48*  ALKPHOS 62 146* 155*  BILITOT 0.4 0.4 0.3  PROT 6.1* 5.9* 6.3*  ALBUMIN 2.4* 2.1* 2.2*   No results for input(s): "LIPASE", "AMYLASE" in the last 168 hours.  No results for input(s): "AMMONIA" in the last 168 hours. Coagulation Profile: Recent Labs  Lab 06/02/23 1748  INR 1.2   Cardiac Enzymes: No results for input(s): "CKTOTAL", "CKMB", "CKMBINDEX", "TROPONINI" in the last 168 hours. BNP (last 3 results) No results for input(s): "PROBNP" in the last 8760 hours. HbA1C: No results for input(s): "HGBA1C" in the last 72 hours. CBG: Recent Labs  Lab 06/04/23 1703 06/05/23 0623 06/05/23 1225 06/05/23 1615 06/06/23 0555  GLUCAP 162* 174* 174* 179* 186*   Lipid Profile: Recent Labs    06/05/23 0639  TRIG 175*    Thyroid Function Tests: No results for input(s): "TSH", "T4TOTAL", "FREET4", "T3FREE", "THYROIDAB" in the last 72 hours. Anemia Panel: No results for input(s): "VITAMINB12", "FOLATE", "FERRITIN", "TIBC", "IRON", "RETICCTPCT" in the last 72 hours. Sepsis Labs: Recent Labs  Lab 06/01/23 0203 06/01/23 1134  PROCALCITON  --  0.76  LATICACIDVEN 1.2  --     Recent Results (from the past 240 hour(s))  MRSA Next Gen by PCR, Nasal     Status: None   Collection  Time: 05/27/23  8:06 PM   Specimen: Nasal Mucosa; Nasal Swab  Result Value Ref Range Status   MRSA by PCR Next Gen NOT DETECTED NOT DETECTED Final    Comment: (NOTE) The GeneXpert MRSA Assay (FDA approved for NASAL specimens only), is one component of a comprehensive MRSA colonization surveillance program. It is not intended to diagnose MRSA infection nor to guide or monitor treatment for MRSA infections. Test performance is not FDA approved in patients less than 54 years old. Performed at [facility], [address], [place], [place] [address]   Culture, blood (Routine X 2) w Reflex to ID Panel     Status: None   Collection Time: 06/01/23  1:11 AM   Specimen: BLOOD  Result Value Ref Range Status   Specimen Description BLOOD SITE NOT SPECIFIED  Final   Special Requests   Final    BOTTLES DRAWN AEROBIC AND ANAEROBIC Blood Culture adequate volume   Culture   Final    NO GROWTH 5 DAYS Performed at [facility], [address], [place], [place] [address]    Report Status 06/06/2023 FINAL  Final  Culture, blood (Routine X 2) w Reflex to ID Panel     Status: None   Collection Time: 06/01/23 11:35 AM   Specimen: BLOOD LEFT ARM  Result Value Ref Range Status   Specimen Description BLOOD LEFT ARM  Final   Special Requests   Final    BOTTLES DRAWN AEROBIC AND ANAEROBIC Blood Culture adequate volume   Culture   Final    NO GROWTH 5 DAYS Performed at [facility], [address], [place], [place] [address]    Report Status 06/06/2023 FINAL  Final  Urine Culture     Status: Abnormal   Collection Time: 06/02/23 10:50 AM   Specimen: Urine, Clean Catch  Result Value Ref Range Status   Specimen Description URINE, CLEAN CATCH  Final   Special Requests NONE  Final   Culture (A)  Final    <10,000 COLONIES/mL INSIGNIFICANT GROWTH Performed at [facility], [address] [place], [place] [address]    Report Status 06/03/2023 FINAL  Final  Aerobic/Anaerobic  Culture w Gram Stain (surgical/deep wound)     Status: None (Preliminary result)   Collection Time: 06/02/23  8:19 PM   Specimen: Abscess  Result Value Ref Range Status   Specimen Description ABSCESS ABDOMEN  Final   Special Requests NONE  Final   Gram Stain   Final    FEW WBC PRESENT, PREDOMINANTLY PMN NO ORGANISMS SEEN    Culture   Final    NO GROWTH 4 DAYS NO ANAEROBES ISOLATED; CULTURE IN PROGRESS FOR 5 DAYS Performed at [facility], [address] [place], [place] [address]    Report Status PENDING  Incomplete     Radiology Studies: No results found.  Scheduled Meds:  acetaminophen (TYLENOL) oral liquid 160 mg/5 mL  650 mg Oral Q6H   Chlorhexidine Gluconate Cloth  6 each Topical Daily   cyclobenzaprine  5 mg Oral TID   enoxaparin (LOVENOX) injection  40 mg Subcutaneous Daily   escitalopram  20 mg Oral Daily   feeding supplement  237 mL Oral BID BM   insulin aspart  0-15 Units Subcutaneous TID WC   metoprolol tartrate  5 mg Intravenous Q12H   [START ON 06/07/2023] multivitamin with minerals  1 tablet Oral Daily   sodium chloride flush  10-40 mL Intracatheter Q12H   Continuous Infusions:  sodium chloride 10 mL/hr (05/30/23 1552)   piperacillin-tazobactam (ZOSYN)  IV 3.375 g (06/06/23 0617)   TPN ADULT (ION) 95 mL/hr at 06/05/23 1748     LOS: 10 days   Hughie Closs, MD [facility]  06/06/2023, 11:13 AM   *Please note that this is a verbal dictation therefore any spelling or grammatical errors are due to the "Dragon Medical One" system interpretation.  Please page via Amion and do not message via secure chat for urgent patient care matters. Secure chat can be used for non urgent patient care matters.  How to contact the [facility] Attending or Consulting provider 7A - 7P or covering provider during after hours 7P -7A, for this patient?  Check the care team in [facility] and look for a) attending/consulting TRH provider listed and b) the [facility] team listed. Page or secure  chat 7A-7P. Log into [email or web address] and use [place]'s universal password to access. If you do not have the password, please contact the hospital operator. Locate the [facility] provider you are looking for under [facility] and page to a number that you can be directly reached. If you still have difficulty reaching the provider, please page the [facility] (Director on Call) for the Hospitalists listed on amion for assistance.

## 2023-06-07 ENCOUNTER — Inpatient Hospital Stay (HOSPITAL_COMMUNITY): Payer: 59

## 2023-06-07 DIAGNOSIS — K567 Ileus, unspecified: Secondary | ICD-10-CM | POA: Diagnosis not present

## 2023-06-07 LAB — CBC
HCT: 30 % — ABNORMAL LOW (ref 36.0–46.0)
Hemoglobin: 9.8 g/dL — ABNORMAL LOW (ref 12.0–15.0)
MCH: 27.5 pg (ref 26.0–34.0)
MCHC: 32.7 g/dL (ref 30.0–36.0)
MCV: 84 fL (ref 80.0–100.0)
Platelets: 559 10*3/uL — ABNORMAL HIGH (ref 150–400)
RBC: 3.57 MIL/uL — ABNORMAL LOW (ref 3.87–5.11)
RDW: 14.4 % (ref 11.5–15.5)
WBC: 17.2 10*3/uL — ABNORMAL HIGH (ref 4.0–10.5)
nRBC: 0 % (ref 0.0–0.2)

## 2023-06-07 LAB — HEMOGLOBIN A1C
Hgb A1c MFr Bld: 7.3 % — ABNORMAL HIGH (ref 4.8–5.6)
Mean Plasma Glucose: 162.81 mg/dL

## 2023-06-07 LAB — GLUCOSE, CAPILLARY
Glucose-Capillary: 104 mg/dL — ABNORMAL HIGH (ref 70–99)
Glucose-Capillary: 125 mg/dL — ABNORMAL HIGH (ref 70–99)
Glucose-Capillary: 121 mg/dL — ABNORMAL HIGH (ref 70–99)
Glucose-Capillary: 147 mg/dL — ABNORMAL HIGH (ref 70–99)

## 2023-06-07 MED ORDER — IOHEXOL 350 MG/ML SOLN
75.0000 mL | Freq: Once | INTRAVENOUS | Status: AC | PRN
  Administered 2023-06-07: 75 mL via INTRAVENOUS

## 2023-06-07 MED ORDER — INSULIN ASPART 100 UNIT/ML IJ SOLN: 0.0000 [IU] | Freq: Every day | INTRAMUSCULAR | Status: DC

## 2023-06-07 MED ORDER — INSULIN ASPART 100 UNIT/ML IJ SOLN
0.0000 [IU] | Freq: Three times a day (TID) | INTRAMUSCULAR | Status: DC
  Administered 2023-06-07 (×2): 2 [IU] via SUBCUTANEOUS
  Administered 2023-06-08 (×2): 3 [IU] via SUBCUTANEOUS
  Administered 2023-06-08 – 2023-06-09 (×4): 2 [IU] via SUBCUTANEOUS

## 2023-06-07 NOTE — Progress Notes (Signed)
Physical Therapy Treatment Patient Details Name: Maria [name] MRN: [id number] DOB: 16-Nov-1980 Today's Date: 06/07/2023   History of Present Illness 42 yo female admitted 7/27 with N/V, ileus. Fascial dehiscence of incision and small bowel herniation with obstruction requiring re-exploration, hernia reduction, vicryl mesh underlay and fascial closure. PMHx: Pt admission 7/19-7/25/24 with lt flank pain. 7/20 S/p ex-lap with evacuation of hemorrhagic ovarian cyst and serosal repair of colon. Migraine, CKD, DM    PT Comments  Pt received in supine and agreeable to session. Pt reports improved abdominal pain today and is making good progress towards functional mobility goals. Pt able to tolerate gait trial without AD this session with improved upright posture and pain management with abdominal binder. Pt reports plan to discharge to her parent's house which has no steps to enter and is one level. Her family is able to provide 24/7 supervision and assist as needed at discharge. Pt continues to benefit from PT services to progress toward functional mobility goals.     If plan is discharge home, recommend the following: A little help with walking and/or transfers;Assistance with cooking/housework;Assist for transportation;Help with stairs or ramp for entrance   Can travel by private vehicle        Equipment Recommendations  BSC/3in1    Recommendations for Other Services       Precautions / Restrictions Precautions Precautions: Other (comment) Precaution Comments: abdominal binder, jp drain Restrictions Weight Bearing Restrictions: No     Mobility  Bed Mobility Overal bed mobility: Needs Assistance Bed Mobility: Supine to Sit     Supine to sit: HOB elevated, Min assist     General bed mobility comments: Light min A with HHA for trunk elevation    Transfers Overall transfer level: Needs assistance Equipment used: None Transfers: Sit to/from Stand Sit to Stand: Modified  independent (Device/Increase time)           General transfer comment: From low EOB    Ambulation/Gait Ambulation/Gait assistance: Supervision Gait Distance (Feet): 450 Feet Assistive device: None Gait Pattern/deviations: Step-through pattern, Decreased stride length       General Gait Details: Slow step-through pattern without AD. Pt demonstrating improved upright posture with abdominal binder and no AD.       Balance Overall balance assessment: Mild deficits observed, not formally tested                                          Cognition Arousal: Alert Behavior During Therapy: WFL for tasks assessed/performed Overall Cognitive Status: Within Functional Limits for tasks assessed                                          Exercises      General Comments        Pertinent Vitals/Pain Pain Assessment Pain Assessment: 0-10 Pain Score: 5  Pain Location: abdomen Pain Descriptors / Indicators: Grimacing, Guarding, Discomfort Pain Intervention(s): Monitored during session, Repositioned     PT Goals (current goals can now be found in the care plan section) Acute Rehab PT Goals Patient Stated Goal: Go to her sisters house on discharge PT Goal Formulation: With patient Time For Goal Achievement: 06/12/23 Potential to Achieve Goals: Good Progress towards PT goals: Progressing toward goals    Frequency    Min  1X/week      PT Plan Current plan remains appropriate       AM-PAC PT "6 Clicks" Mobility   Outcome Measure  Help needed turning from your back to your side while in a flat bed without using bedrails?: None Help needed moving from lying on your back to sitting on the side of a flat bed without using bedrails?: A Little Help needed moving to and from a bed to a chair (including a wheelchair)?: None Help needed standing up from a chair using your arms (e.g., wheelchair or bedside chair)?: None Help needed to walk in  hospital room?: A Little Help needed climbing 3-5 steps with a railing? : A Little 6 Click Score: 21    End of Session Equipment Utilized During Treatment: Other (comment) (abd binder) Activity Tolerance: Patient tolerated treatment well Patient left: with call bell/phone within reach;in chair Nurse Communication: Mobility status PT Visit Diagnosis: Other abnormalities of gait and mobility (R26.89)     Time: 9147-8295 PT Time Calculation (min) (ACUTE ONLY): 26 min  Charges:    $Gait Training: 23-37 mins PT General Charges $$ ACUTE PT VISIT: 1 Visit                     Johny Shock, PTA Acute Rehabilitation Services Secure Chat Preferred  [phone]    Johny Shock 06/07/2023, 9:25 AM

## 2023-06-07 NOTE — Progress Notes (Signed)
Progress Note  11 Days Post-Op  Subjective: Pt reports she is feeling better this AM after being able to rest yesterday. She tolerated diet yesterday and BMs are becoming more solid. Pain stable. She feels like there has been less drainage from incision overnight   Objective: Vital signs in last 24 hours: Temp:  [97.6 F (36.4 C)-98.8 F (37.1 C)] 98.8 F (37.1 C) (08/07 0812) Pulse Rate:  [85-122] 122 (08/07 0812) Resp:  [14-17] 17 (08/07 0423) BP: (106-125)/(66-82) 125/82 (08/07 0812) SpO2:  [94 %-99 %] 94 % (08/07 0812) Weight:  [78.7 kg] 78.7 kg (08/07 0613) Last BM Date : 06/06/23  Intake/Output from previous day: 08/06 0701 - 08/07 0700 In: 270 [P.O.:240] Out: 53 [Drains:53] Intake/Output this shift: No intake/output data recorded.  PE: General: Alert, NAD Heart: sinus tachy in 100s Lungs: Respiratory effort nonlabored on room air Abd: appropriately ttp, IR drain with SS fluid, surgical drain in SQ with SS fluid today; incision with central opening and healthy granulation, decreased drainage overall    Lab Results:  Recent Labs    06/06/23 0623 06/07/23 0806  WBC 19.4* 17.2*  HGB 10.1* 9.8*  HCT 30.2* 30.0*  PLT 532* 559*   BMET Recent Labs    06/05/23 0639 06/06/23 0845  NA 135 136  K 4.2 4.3  CL 100 101  CO2 25 24  GLUCOSE 169* 174*  BUN 12 13  CREATININE 0.73 0.71  CALCIUM 8.3* 8.5*   PT/INR No results for input(s): "LABPROT", "INR" in the last 72 hours. CMP     Component Value Date/Time   NA 136 06/06/2023 0845   K 4.3 06/06/2023 0845   CL 101 06/06/2023 0845   CO2 24 06/06/2023 0845   GLUCOSE 174 (H) 06/06/2023 0845   BUN 13 06/06/2023 0845   CREATININE 0.71 06/06/2023 0845   CALCIUM 8.5 (L) 06/06/2023 0845   PROT 6.3 (L) 06/05/2023 0639   ALBUMIN 2.2 (L) 06/05/2023 0639   AST 30 06/05/2023 0639   ALT 48 (H) 06/05/2023 0639   ALKPHOS 155 (H) 06/05/2023 0639   BILITOT 0.3 06/05/2023 0639   GFRNONAA >60 06/06/2023 0845   GFRAA  >60 05/31/2018 1753   Lipase     Component Value Date/Time   LIPASE 38 05/27/2023 1243       Studies/Results: No results found.  Anti-infectives: Anti-infectives (From admission, onward)    Start     Dose/Rate Route Frequency Ordered Stop   06/01/23 1400  piperacillin-tazobactam (ZOSYN) IVPB 3.375 g        3.375 g 12.5 mL/hr over 240 Minutes Intravenous Every 8 hours 06/01/23 1312     05/27/23 1545  ceFAZolin (ANCEF) IVPB 2g/100 mL premix       Placed in "And" Linked Group   2 g 200 mL/hr over 30 Minutes Intravenous On call to O.R. 05/27/23 1539 05/27/23 1655   05/27/23 1545  metroNIDAZOLE (FLAGYL) IVPB 500 mg       Placed in "And" Linked Group   500 mg 100 mL/hr over 60 Minutes Intravenous On call to O.R. 05/27/23 1539 05/27/23 1753   05/27/23 1545  metroNIDAZOLE (FLAGYL) 500 MG/100ML IVPB       Note to Pharmacy: Crissie Sickles: cabinet override      05/27/23 1545 05/27/23 1653   05/27/23 1545  ceFAZolin (ANCEF) 2-4 GM/100ML-% IVPB       Note to Pharmacy: Crissie Sickles: cabinet override      05/27/23 1545 05/27/23 1651  Assessment/Plan   S/p  ex-lap with extensive LOA, evacuation of hemorrhagic ovarian cyst and serosal repair of colon 05/20/23  Fascial dehiscence of Pfannenstiel incision and small bowel herniation with obstruction  POD#11 - s/p re-exploration, hernia reduction, vicryl mesh underlay and fascial closure  7/27 - tolerating diet and having bowel function  - CT 8/1 with intraperitoneal collection in pelvis - s/p IR drain 8/2 with peach colored fluid evacuated, NGTD - IV Zosyn, WBC trending down but still 17K, repeat CT AP with IV contrast today - binder and continue drain in SQ, monitoring incision closely - BID wet to dry  - continue wound care, dressing changes, drain - mobilize as able - GYN has signed off   FEN: CM diet VTE: lovenox ID: [email or web address]   - per primary attending -  T2DM AKI on CKD    LOS: 11 days      Juliet Rude,  [facility] 06/07/2023, 9:18 AM Please see Amion for pager number during day hours 7:00am-4:30pm

## 2023-06-07 NOTE — Progress Notes (Signed)
PROGRESS NOTE    Maria [name]  LPF:[id number] DOB: Apr 20, 1981 DOA: 05/27/2023 PCP: Deatra James, MD   Brief Narrative:  42 yo F PMH DM on glyburide, depression, who is POD 7 (Op date 7/20) ex lap evac of L hemorrhagic ovarian cyst, and extensive intraabdominal LOA, sigmoid colon serosal tear repair. She remained in the hospital until dc 7/25. Prior to dc she had + BM and + flatus. On 7/27 admited to ICU with hypoglycemia, concern for ileus. S/p re-exploration, hernia reduction, Vicryl mesh underlay and fascial closure.   Assessment & Plan:   Principal Problem:   Ileus (HCC) Active Problems:   Hypoglycemia   Intestinal obstruction (HCC)   Malnutrition of moderate degree  DM2, hypoglycemia -most likely poor intake + vomiting + taking home sulfonylurea, initially on dextrose drip and then she was on TPN and being managed with TPN.  Now she is off of TPN since 7 PM 06/06/2023.  Will start her on SSI, check hemoglobin A1c.    Hernia with bowel obstruction, fascial dehiscence/leukocytosis/low-grade fever -- s/p ex lap hernia reduction, fascia closure (7/27)  Recent ex lap evac of hemorrhagic ovarian cyst, extensive LOA and sigmoid colon serosal repait (7/20).  TPN for nutrition.  Had yet another episode of fever of 101.2 at 2 AM 06/03/2023.  Afebrile since then.  NG tube pulled out 06/03/2023 by accident, patient is feeling better, abdominal pain is improving, no nausea.  Tolerating full liquid diet.  General surgery managing, they are advancing diet to soft diet.  TPN stopped 7 PM 06/06/2023.  Leukocytosis improved but general surgery has ordered CT abdomen with contrast.   Acute hypoxic respiratory failure: Patient dropped her oxygen saturation and required 2 L of oxygen, chest x-ray suggestive of atelectasis but no edema or consolidation.  This is likely due to pain prohibiting her from taking deep breaths.  She is on room air and comfortable.  Once again encouraged her to use incentive spirometry  more often.   AKI -resolved   Low phos -replace in TPN   Anemia Stable.  Monitor daily.  Sinus tachycardia/hypertension: Heart rate and blood pressure both improved but has mild tachycardia at times.  Tapering beta-blocker, will stop IV Lopressor today.  Monitor.  DVT prophylaxis: enoxaparin (LOVENOX) injection 40 mg Start: 06/03/23 1000   Code Status: Full Code  Family Communication:  None present at bedside.  Plan of care discussed with patient in length and he/she verbalized understanding and agreed with it.  Status is: Inpatient Remains inpatient appropriate because: Postop ileus improving, may discharge in next 1 to 2 days.   Estimated body mass index is 37.51 kg/m as calculated from the following:   Height as of this encounter: 4' 9.01" (1.448 m).   Weight as of this encounter: 78.7 kg.    Nutritional Assessment: Body mass index is 37.51 kg/m.Marland Kitchen Seen by dietician.  I agree with the assessment and plan as outlined below: Nutrition Status: Nutrition Problem: Moderate Malnutrition Etiology: acute illness (bowel obstruction, nausea, vomiting) Signs/Symptoms: mild muscle depletion, energy intake < or equal to 50% for > or equal to 5 days Interventions: TPN  . Skin Assessment: I have examined the patient's skin and I agree with the wound assessment as performed by the wound care RN as outlined below:    Consultants:  Gynecology and general surgery  Procedures:  As above  Antimicrobials:  Anti-infectives (From admission, onward)    Start     Dose/Rate Route Frequency Ordered Stop   06/01/23  1400  piperacillin-tazobactam (ZOSYN) IVPB 3.375 g        3.375 g 12.5 mL/hr over 240 Minutes Intravenous Every 8 hours 06/01/23 1312     05/27/23 1545  ceFAZolin (ANCEF) IVPB 2g/100 mL premix       Placed in "And" Linked Group   2 g 200 mL/hr over 30 Minutes Intravenous On call to O.R. 05/27/23 1539 05/27/23 1655   05/27/23 1545  metroNIDAZOLE (FLAGYL) IVPB 500 mg        Placed in "And" Linked Group   500 mg 100 mL/hr over 60 Minutes Intravenous On call to O.R. 05/27/23 1539 05/27/23 1753   05/27/23 1545  metroNIDAZOLE (FLAGYL) 500 MG/100ML IVPB       Note to Pharmacy: Crissie Sickles: cabinet override      05/27/23 1545 05/27/23 1653   05/27/23 1545  ceFAZolin (ANCEF) 2-4 GM/100ML-% IVPB       Note to Pharmacy: Crissie Sickles: cabinet override      05/27/23 1545 05/27/23 1651         Subjective: Patient seen and examined.  Has very mild abdominal pain.  No nausea.  Tolerating diet well and having bowel movements as well.  Objective: Vitals:   06/06/23 2320 06/07/23 0423 06/07/23 0613 06/07/23 0812  BP: 109/74 111/70  125/82  Pulse: 91 85  (!) 122  Resp:  17    Temp: 98.7 F (37.1 C) 98.5 F (36.9 C)  98.8 F (37.1 C)  TempSrc: Oral Oral  Oral  SpO2: 98% 96%  94%  Weight:   78.7 kg   Height:        Intake/Output Summary (Last 24 hours) at 06/07/2023 1051 Last data filed at 06/07/2023 0700 Gross per 24 hour  Intake 270 ml  Output 53 ml  Net 217 ml   Filed Weights   06/03/23 0717 06/06/23 0558 06/07/23 0613  Weight: 81.2 kg 80 kg 78.7 kg    Examination:  General exam: Appears calm and comfortable  Respiratory system: Clear to auscultation. Respiratory effort normal. Cardiovascular system: S1 & S2 heard, RRR. No JVD, murmurs, rubs, gallops or clicks. No pedal edema. Gastrointestinal system: Abdomen is nondistended, soft and mild to moderate generalized tenderness. No organomegaly or masses felt. Normal bowel sounds heard. Central nervous system: Alert and oriented. No focal neurological deficits. Extremities: Symmetric 5 x 5 power. Skin: No rashes, lesions or ulcers.  Psychiatry: Judgement and insight appear normal. Mood & affect appropriate.   Data Reviewed: I have personally reviewed following labs and imaging studies  CBC: Recent Labs  Lab 06/02/23 0515 06/03/23 0541 06/04/23 0629 06/06/23 0623 06/07/23 0806  WBC 16.3*  20.0* 18.8* 19.4* 17.2*  NEUTROABS 9.1* 13.8* 11.8*  --   --   HGB 9.5* 9.5* 9.4* 10.1* 9.8*  HCT 28.8* 28.0* 28.3* 30.2* 30.0*  MCV 83.7 82.6 83.2 85.1 84.0  PLT 398 395 411* 532* 559*   Basic Metabolic Panel: Recent Labs  Lab 06/01/23 0203 06/02/23 0515 06/03/23 0541 06/04/23 0629 06/05/23 0639 06/06/23 0845  NA 140 138 136 137 135 136  K 3.8 3.7 4.0 4.3 4.2 4.3  CL 102 104 102 104 100 101  CO2 24 24 24 22 25 24   GLUCOSE 164* 169* 171* 174* 169* 174*  BUN 16 15 16 15 12 13   CREATININE 0.72 0.76 0.66 0.67 0.73 0.71  CALCIUM 8.9 8.4* 8.4* 8.2* 8.3* 8.5*  MG 2.1  --  2.1  --  2.0  --   PHOS  3.3  --  3.8  --  3.5  --    GFR: Estimated Creatinine Clearance: 79.8 mL/min (by C-G formula based on SCr of 0.71 mg/dL). Liver Function Tests: Recent Labs  Lab 06/01/23 0203 06/04/23 0629 06/05/23 0639  AST 17 24 30   ALT 18 31 48*  ALKPHOS 62 146* 155*  BILITOT 0.4 0.4 0.3  PROT 6.1* 5.9* 6.3*  ALBUMIN 2.4* 2.1* 2.2*   No results for input(s): "LIPASE", "AMYLASE" in the last 168 hours.  No results for input(s): "AMMONIA" in the last 168 hours. Coagulation Profile: Recent Labs  Lab 06/02/23 1748  INR 1.2   Cardiac Enzymes: No results for input(s): "CKTOTAL", "CKMB", "CKMBINDEX", "TROPONINI" in the last 168 hours. BNP (last 3 results) No results for input(s): "PROBNP" in the last 8760 hours. HbA1C: No results for input(s): "HGBA1C" in the last 72 hours. CBG: Recent Labs  Lab 06/05/23 1615 06/06/23 0555 06/06/23 1114 06/06/23 1641 06/07/23 0610  GLUCAP 179* 186* 170* 147* 104*   Lipid Profile: Recent Labs    06/05/23 0639  TRIG 175*    Thyroid Function Tests: No results for input(s): "TSH", "T4TOTAL", "FREET4", "T3FREE", "THYROIDAB" in the last 72 hours. Anemia Panel: No results for input(s): "VITAMINB12", "FOLATE", "FERRITIN", "TIBC", "IRON", "RETICCTPCT" in the last 72 hours. Sepsis Labs: Recent Labs  Lab 06/01/23 0203 06/01/23 1134  PROCALCITON   --  0.76  LATICACIDVEN 1.2  --     Recent Results (from the past 240 hour(s))  Culture, blood (Routine X 2) w Reflex to ID Panel     Status: None   Collection Time: 06/01/23  1:11 AM   Specimen: BLOOD  Result Value Ref Range Status   Specimen Description BLOOD SITE NOT SPECIFIED  Final   Special Requests   Final    BOTTLES DRAWN AEROBIC AND ANAEROBIC Blood Culture adequate volume   Culture   Final    NO GROWTH 5 DAYS Performed at [facility], [address], [place], [place] [address]    Report Status 06/06/2023 FINAL  Final  Culture, blood (Routine X 2) w Reflex to ID Panel     Status: None   Collection Time: 06/01/23 11:35 AM   Specimen: BLOOD LEFT ARM  Result Value Ref Range Status   Specimen Description BLOOD LEFT ARM  Final   Special Requests   Final    BOTTLES DRAWN AEROBIC AND ANAEROBIC Blood Culture adequate volume   Culture   Final    NO GROWTH 5 DAYS Performed at [facility], [address], [place], [place] [address]    Report Status 06/06/2023 FINAL  Final  Urine Culture     Status: Abnormal   Collection Time: 06/02/23 10:50 AM   Specimen: Urine, Clean Catch  Result Value Ref Range Status   Specimen Description URINE, CLEAN CATCH  Final   Special Requests NONE  Final   Culture (A)  Final    <10,000 COLONIES/mL INSIGNIFICANT GROWTH Performed at [facility], [address], [place], [place] [address]    Report Status 06/03/2023 FINAL  Final  Aerobic/Anaerobic Culture w Gram Stain (surgical/deep wound)     Status: None (Preliminary result)   Collection Time: 06/02/23  8:19 PM   Specimen: Abscess  Result Value Ref Range Status   Specimen Description ABSCESS ABDOMEN  Final   Special Requests NONE  Final   Gram Stain   Final    FEW WBC PRESENT, PREDOMINANTLY PMN NO ORGANISMS SEEN  Culture   Final    NO GROWTH 4 DAYS NO ANAEROBES ISOLATED; CULTURE IN PROGRESS FOR 5 DAYS Performed at [facility], [address] [place],  [place] [address]    Report Status PENDING  Incomplete     Radiology Studies: No results found.  Scheduled Meds:  acetaminophen (TYLENOL) oral liquid 160 mg/5 mL  650 mg Oral Q6H   Chlorhexidine Gluconate Cloth  6 each Topical Daily   cyclobenzaprine  5 mg Oral TID   enoxaparin (LOVENOX) injection  40 mg Subcutaneous Daily   escitalopram  20 mg Oral Daily   feeding supplement  237 mL Oral BID BM   insulin aspart  0-15 Units Subcutaneous TID WC   metoprolol tartrate  5 mg Intravenous Q12H   multivitamin with minerals  1 tablet Oral Daily   sodium chloride flush  10-40 mL Intracatheter Q12H   Continuous Infusions:  sodium chloride 10 mL/hr (05/30/23 1552)   piperacillin-tazobactam (ZOSYN)  IV 3.375 g (06/07/23 0655)     LOS: 11 days   Hughie Closs, MD [facility]  06/07/2023, 10:51 AM   *Please note that this is a verbal dictation therefore any spelling or grammatical errors are due to the "Dragon [employer] One" system interpretation.  Please page via Amion and do not message via secure chat for urgent patient care matters. Secure chat can be used for non urgent patient care matters.  How to contact the [facility] Attending or Consulting provider 7A - 7P or covering provider during after hours 7P -7A, for this patient?  Check the care team in [facility] and look for a) attending/consulting TR[facility] provider listed and b) the [facility] team listed. Page or secure chat 7A-7P. Log into [email or web address] and use [place]'s universal password to access. If you do not have the password, please contact the hospital operator. Locate the [facility] provider you are looking for under [facility] and page to a number that you can be directly reached. If you still have difficulty reaching the provider, please page the [facility] (Director on Call) for the Hospitalists listed on amion for assistance.

## 2023-06-07 NOTE — Progress Notes (Signed)
Mobility Specialist Progress Note:   06/07/23 1500  Mobility  Activity Ambulated independently in hallway  Level of Assistance Standby assist, set-up cues, supervision of patient - no hands on  Assistive Device None  Distance Ambulated (ft) 650 ft  Activity Response Tolerated well  Mobility Referral Yes  $Mobility charge 1 Mobility  Mobility Specialist Start Time (ACUTE ONLY) 1530  Mobility Specialist Stop Time (ACUTE ONLY) 1542  Mobility Specialist Time Calculation (min) (ACUTE ONLY) 12 min    During Mobility: 121 HR Post Mobility:  105 HR  Pt received in bed, agreeable to mobility. Asymptomatic throughout w/ no c/o. Pt left in bed with call bell and all needs met.  Maria [name] Mobility Specialist Please contact via Special educational needs teacher or Rehab office at [phone]

## 2023-06-07 NOTE — Plan of Care (Signed)
  Problem: Education: Goal: Knowledge of the prescribed therapeutic regimen will improve Outcome: Progressing Goal: Understanding of sexual limitations or changes related to disease process or condition will improve Outcome: Progressing Goal: Individualized Educational Video(s) Outcome: Progressing   Problem: Self-Concept: Goal: Communication of feelings regarding changes in body function or appearance will improve Outcome: Progressing   Problem: Skin Integrity: Goal: Demonstration of wound healing without infection will improve Outcome: Progressing   Problem: Education: Goal: Knowledge of General Education information will improve Description: Including pain rating scale, medication(s)/side effects and non-pharmacologic comfort measures Outcome: Progressing   Problem: Health Behavior/Discharge Planning: Goal: Ability to manage health-related needs will improve Outcome: Progressing   Problem: Clinical Measurements: Goal: Ability to maintain clinical measurements within normal limits will improve Outcome: Progressing Goal: Will remain free from infection Outcome: Progressing Goal: Diagnostic test results will improve Outcome: Progressing Goal: Respiratory complications will improve Outcome: Progressing Goal: Cardiovascular complication will be avoided Outcome: Progressing   Problem: Activity: Goal: Risk for activity intolerance will decrease Outcome: Progressing   Problem: Nutrition: Goal: Adequate nutrition will be maintained Outcome: Progressing   Problem: Coping: Goal: Level of anxiety will decrease Outcome: Progressing   Problem: Elimination: Goal: Will not experience complications related to bowel motility Outcome: Progressing Goal: Will not experience complications related to urinary retention Outcome: Progressing   Problem: Pain Managment: Goal: General experience of comfort will improve Outcome: Progressing   Problem: Safety: Goal: Ability to remain  free from injury will improve Outcome: Progressing   Problem: Skin Integrity: Goal: Risk for impaired skin integrity will decrease Outcome: Progressing   Problem: Education: Goal: Ability to describe self-care measures that may prevent or decrease complications (Diabetes Survival Skills Education) will improve Outcome: Progressing Goal: Individualized Educational Video(s) Outcome: Progressing   Problem: Coping: Goal: Ability to adjust to condition or change in health will improve Outcome: Progressing   Problem: Fluid Volume: Goal: Ability to maintain a balanced intake and output will improve Outcome: Progressing   Problem: Health Behavior/Discharge Planning: Goal: Ability to identify and utilize available resources and services will improve Outcome: Progressing Goal: Ability to manage health-related needs will improve Outcome: Progressing   Problem: Metabolic: Goal: Ability to maintain appropriate glucose levels will improve Outcome: Progressing   Problem: Nutritional: Goal: Maintenance of adequate nutrition will improve Outcome: Progressing Goal: Progress toward achieving an optimal weight will improve Outcome: Progressing   Problem: Skin Integrity: Goal: Risk for impaired skin integrity will decrease Outcome: Progressing   Problem: Tissue Perfusion: Goal: Adequacy of tissue perfusion will improve Outcome: Progressing

## 2023-06-08 DIAGNOSIS — K567 Ileus, unspecified: Secondary | ICD-10-CM | POA: Diagnosis not present

## 2023-06-08 LAB — GLUCOSE, CAPILLARY
Glucose-Capillary: 149 mg/dL — ABNORMAL HIGH (ref 70–99)
Glucose-Capillary: 179 mg/dL — ABNORMAL HIGH (ref 70–99)
Glucose-Capillary: 151 mg/dL — ABNORMAL HIGH (ref 70–99)
Glucose-Capillary: 185 mg/dL — ABNORMAL HIGH (ref 70–99)

## 2023-06-08 MED ORDER — PIPERACILLIN-TAZOBACTAM 3.375 G IVPB
3.3750 g | Freq: Three times a day (TID) | INTRAVENOUS | Status: DC
  Administered 2023-06-08 – 2023-06-09 (×4): 3.375 g via INTRAVENOUS
  Filled 2023-06-08 (×4): qty 50

## 2023-06-08 NOTE — Progress Notes (Signed)
Progress Note  12 Days Post-Op  Subjective: Pt reports she is feeling well. Tolerating diet. Mobilizing well. Pain stable. Denies nausea or vomiting. Discussed CT results.   Objective: Vital signs in last 24 hours: Temp:  [98.5 F (36.9 C)-98.8 F (37.1 C)] 98.5 F (36.9 C) (08/08 0759) Pulse Rate:  [94-107] 100 (08/08 0759) Resp:  [17-18] 18 (08/08 0759) BP: (116-139)/(73-98) 139/98 (08/08 0759) SpO2:  [91 %-98 %] 98 % (08/08 0759) Weight:  [78.1 kg] 78.1 kg (08/08 0411) Last BM Date : 06/07/23  Intake/Output from previous day: 08/07 0701 - 08/08 0700 In: 220 [P.O.:220] Out: 40 [Drains:40] Intake/Output this shift: No intake/output data recorded.  PE: General: Alert, NAD Heart: sinus tachy in 100s Lungs: Respiratory effort nonlabored on room air Abd: appropriately ttp, IR drain with SS fluid, surgical drain in SQ with SS fluid today; midline dressing CDI   Lab Results:  Recent Labs    06/07/23 0806 06/08/23 0448  WBC 17.2* 13.0*  HGB 9.8* 9.9*  HCT 30.0* 29.4*  PLT 559* 550*   BMET Recent Labs    06/06/23 0845 06/08/23 0448  NA 136 133*  K 4.3 4.2  CL 101 102  CO2 24 22  GLUCOSE 174* 133*  BUN 13 15  CREATININE 0.71 0.69  CALCIUM 8.5* 8.2*   PT/INR No results for input(s): "LABPROT", "INR" in the last 72 hours. CMP     Component Value Date/Time   NA 133 (L) 06/08/2023 0448   K 4.2 06/08/2023 0448   CL 102 06/08/2023 0448   CO2 22 06/08/2023 0448   GLUCOSE 133 (H) 06/08/2023 0448   BUN 15 06/08/2023 0448   CREATININE 0.69 06/08/2023 0448   CALCIUM 8.2 (L) 06/08/2023 0448   PROT 6.1 (L) 06/08/2023 0448   ALBUMIN 2.3 (L) 06/08/2023 0448   AST 56 (H) 06/08/2023 0448   ALT 93 (H) 06/08/2023 0448   ALKPHOS 136 (H) 06/08/2023 0448   BILITOT 0.2 (L) 06/08/2023 0448   GFRNONAA >60 06/08/2023 0448   GFRAA >60 05/31/2018 1753   Lipase     Component Value Date/Time   LIPASE 38 05/27/2023 1243       Studies/Results: CT ABDOMEN PELVIS W  CONTRAST  Result Date: 06/07/2023 CLINICAL DATA:  Postop abdominal pain. Post laparotomy with recent percutaneous drainage of fluid collection. EXAM: CT ABDOMEN AND PELVIS WITH CONTRAST TECHNIQUE: Multidetector CT imaging of the abdomen and pelvis was performed using the standard protocol following bolus administration of intravenous contrast. RADIATION DOSE REDUCTION: This exam was performed according to the departmental dose-optimization program which includes automated exposure control, adjustment of the mA and/or kV according to patient size and/or use of iterative reconstruction technique. CONTRAST:  75mL OMNIPAQUE IOHEXOL 350 MG/ML SOLN COMPARISON:  CT 06/01/2023. FINDINGS: Lower chest: Improvement in basilar atelectasis.  No pleural fluid. Hepatobiliary: No focal hepatic abnormality or intrahepatic collection. Partially distended gallbladder, normal for degree of distension. No biliary dilatation. Pancreas: No ductal dilatation or inflammation. Spleen: Normal in size without focal abnormality. Adrenals/Urinary Tract: No adrenal nodule. No hydronephrosis or perinephric edema. The urinary bladder is near completely empty. Wall thickening may be due to nondistention. Stomach/Bowel: Detailed bowel assessment is limited in the absence of enteric contrast. Diffusely fluid-filled small bowel with mild wall thickening. No obstruction. Small volume of colonic stool. Normal appendix. Vascular/Lymphatic: Normal caliber abdominal aorta. Scattered retroperitoneal mesenteric nodes, typically reactive. Reproductive: Hysterectomy. Other: Percutaneous drainage catheter in the pelvis decreased size of pelvic fluid collection. Collection is essentially collapsed  with minimal residual measuring 3.8 x 1.3 cm. Small amount of ill-defined fluid tracks superiorly from this collection. Open abdominal wound in the lower abdominal midline with drain in place. There is soft tissue thickening of the anterior abdominal wall musculature  with small amount of locules of gas in the region of the drain. Suspect prior ventral abdominal wall hernia repair with mesh. Scattered ill-defined fluid in the abdominal wall and musculature but no discrete collection. No new abdominopelvic collection. Musculoskeletal: There are no acute or suspicious osseous abnormalities. IMPRESSION: 1. Percutaneous drainage catheter in the pelvis decreased size of pelvic fluid collection. Collection is essentially collapsed with minimal residual measuring 3.8 x 1.3 cm. Small amount of ill-defined fluid tracks superiorly from this collection. 2. Open abdominal wound in the lower abdominal midline with drain in place. There is soft tissue thickening of the anterior abdominal wall musculature with small amount of locules of gas in the region of the drain. Scattered ill-defined fluid in the abdominal wall soft tissues and musculature but no discrete collection. 3. Diffusely fluid-filled small bowel with mild wall thickening. Overall improvement in small bowel distension from prior exam. Suspect some degree of residual ileus. Electronically Signed   By: Narda Rutherford M.D.   On: 06/07/2023 15:16    Anti-infectives: Anti-infectives (From admission, onward)    Start     Dose/Rate Route Frequency Ordered Stop   06/01/23 1400  piperacillin-tazobactam (ZOSYN) IVPB 3.375 g        3.375 g 12.5 mL/hr over 240 Minutes Intravenous Every 8 hours 06/01/23 1312     05/27/23 1545  ceFAZolin (ANCEF) IVPB 2g/100 mL premix       Placed in "And" Linked Group   2 g 200 mL/hr over 30 Minutes Intravenous On call to O.R. 05/27/23 1539 05/27/23 1655   05/27/23 1545  metroNIDAZOLE (FLAGYL) IVPB 500 mg       Placed in "And" Linked Group   500 mg 100 mL/hr over 60 Minutes Intravenous On call to O.R. 05/27/23 1539 05/27/23 1753   05/27/23 1545  metroNIDAZOLE (FLAGYL) 500 MG/100ML IVPB       Note to Pharmacy: Crissie Sickles: cabinet override      05/27/23 1545 05/27/23 1653   05/27/23  1545  ceFAZolin (ANCEF) 2-4 GM/100ML-% IVPB       Note to Pharmacy: Crissie Sickles: cabinet override      05/27/23 1545 05/27/23 1651        Assessment/Plan   S/p  ex-lap with extensive LOA, evacuation of hemorrhagic ovarian cyst and serosal repair of colon 05/20/23  Fascial dehiscence of Pfannenstiel incision and small bowel herniation with obstruction  POD#12 - s/p re-exploration, hernia reduction, vicryl mesh underlay and fascial closure  7/27 - tolerating diet and having bowel function  - CT 8/1 with intraperitoneal collection in pelvis - s/p IR drain 8/2 with peach colored fluid evacuated, NGTD - repeat CT yesterday with decrease in pelvic fluid collection, still suspect this is seroma - discussed with IR and drain output too high to remove - CT also showed some scattered fluid in abdominal wall - continue SQ drain and daily dressing changes on discharge - WBC down to 13K and patient afebrile, would finish Zosyn today but likely stop on DC - binder and continue drain in SQ, monitoring incision closely - BID wet to dry in the hospital - patient is surgically stable for DC - would like to have family learn wound and drain care today and plan for DC  tomorrow AM - GYN has signed off   FEN: CM diet VTE: lovenox ID: [email or web address] to stop today    - per primary attending -  T2DM AKI on CKD    LOS: 12 days      Juliet Rude, [facility] Surgery 06/08/2023, 9:04 AM Please see Amion for pager number during day hours 7:00am-4:30pm

## 2023-06-08 NOTE — Progress Notes (Signed)
Mobility Specialist Progress Note:   06/08/23 1500  Mobility  Activity Ambulated independently in hallway  Level of Assistance Independent  Assistive Device None  Distance Ambulated (ft) 650 ft  Activity Response Tolerated well  Mobility Referral Yes  $Mobility charge 1 Mobility  Mobility Specialist Start Time (ACUTE ONLY) 1518  Mobility Specialist Stop Time (ACUTE ONLY) 1530  Mobility Specialist Time Calculation (min) (ACUTE ONLY) 12 min    Pre Mobility: 100 HR During Mobility: 128 HR Post Mobility:  110 HR  Received pt in bed having no complaints and agreeable to mobility. Pt was asymptomatic throughout ambulation and returned to room w/o fault. Left in bed w/ call bell in reach and all needs met.   [name] Mobility Specialist Please contact via Special educational needs teacher or Rehab office at [phone]

## 2023-06-08 NOTE — Discharge Instructions (Signed)
[facility], [place] [phone]  OPEN ABDOMINAL SURGERY: POST OP INSTRUCTIONS  Always review your discharge instruction sheet given to you by the facility where your surgery was performed.  IF YOU HAVE DISABILITY OR FAMILY LEAVE FORMS, YOU MUST BRING THEM TO THE OFFICE FOR PROCESSING.  PLEASE DO NOT GIVE THEM TO YOUR DOCTOR.  A prescription for pain medication may be given to you upon discharge.  Take your pain medication as prescribed, if needed.  If narcotic pain medicine is not needed, then you may take acetaminophen (Tylenol) or ibuprofen (Advil) as needed. Take your usually prescribed medications unless otherwise directed. If you need a refill on your pain medication, please contact your pharmacy. They will contact our office to request authorization.  Prescriptions will not be filled after 5pm or on week-ends. You should follow a light diet the first few days after arrival home, such as soup and crackers, pudding, etc.unless your doctor has advised otherwise. A high-fiber, low fat diet can be resumed as tolerated.   Be sure to include lots of fluids daily. Most patients will experience some swelling and bruising on the chest and neck area.  Ice packs will help.  Swelling and bruising can take several days to resolve Most patients will experience some swelling and bruising in the area of the incision. Ice pack will help. Swelling and bruising can take several days to resolve..  It is common to experience some constipation if taking pain medication after surgery.  Increasing fluid intake and taking a stool softener will usually help or prevent this problem from occurring.  A mild laxative (Milk of Magnesia or Miralax) should be taken according to package directions if there are no bowel movements after 48 hours.  You may have steri-strips (small skin tapes) in place directly over the incision.  These strips should be left on the skin for 7-10 days.  If your surgeon used skin  glue on the incision, you may shower in 24 hours.  The glue will flake off over the next 2-3 weeks.  Any sutures or staples will be removed at the office during your follow-up visit. You may find that a light gauze bandage over your incision may keep your staples from being rubbed or pulled. You may shower and replace the bandage daily. ACTIVITIES:  You may resume regular (light) daily activities beginning the next day--such as daily self-care, walking, climbing stairs--gradually increasing activities as tolerated.  You may have sexual intercourse when it is comfortable.  Refrain from any heavy lifting or straining until approved by your doctor. You may drive when you no longer are taking prescription pain medication, you can comfortably wear a seatbelt, and you can safely maneuver your car and apply brakes  You should see your doctor in the office for a follow-up appointment approximately two weeks after your surgery.  Make sure that you call for this appointment within a day or two after you arrive home to insure a convenient appointment time.  WHEN TO CALL YOUR DOCTOR: Fever over 101.0 Inability to urinate Nausea and/or vomiting Extreme swelling or bruising Continued bleeding from incision. Increased pain, redness, or drainage from the incision. Difficulty swallowing or breathing Muscle cramping or spasms. Numbness or tingling in hands or feet or around lips.  The clinic staff is available to answer your questions during regular business hours.  Please don't hesitate to call and ask to speak to one of the nurses if you have concerns.  For  further questions, please visit [email or web address]   WOUND CARE: - dressing to be changed daily - supplies: sterile saline, kerlix/guaze, scissors, ABD pads, tape  - remove dressing and all packing carefully, moistening with sterile saline as needed to avoid packing/internal dressing sticking to the wound. - clean edges of skin around the  wound with water/gauze, making sure there is no tape debris or leakage left on skin that could cause skin irritation or breakdown. - dampen clean kerlix/gauze with sterile saline and pack wound from wound base to skin level, making sure to take note of any possible areas of wound tracking, tunneling and packing appropriately. Wound can be packed loosely. Trim kerlix/gauze to size if a whole roll/piece is not required. - cover wound with a dry ABD pad and secure with tape.  - write the date/time on the dry dressing/tape to better track when the last dressing change occurred. - apply any skin protectant/powder recommended by clinician to protect skin/skin folds. - change dressing as needed if leakage occurs, wound gets contaminated, or patient requests to shower. - patient may shower daily with wound open and following the shower the wound should be dried and a clean dressing placed.

## 2023-06-08 NOTE — Progress Notes (Signed)
PROGRESS NOTE    Maria [name]  VOH:[id number] DOB: 06-15-1981 DOA: 05/27/2023 PCP: Deatra James, MD   Brief Narrative:  42 yo F PMH DM on glyburide, depression, who is POD 7 (Op date 7/20) ex lap evac of L hemorrhagic ovarian cyst, and extensive intraabdominal LOA, sigmoid colon serosal tear repair. She remained in the hospital until dc 7/25. Prior to dc she had + BM and + flatus. On 7/27 admited to ICU with hypoglycemia, concern for ileus. S/p re-exploration, hernia reduction, Vicryl mesh underlay and fascial closure.   Assessment & Plan:   Principal Problem:   Ileus (HCC) Active Problems:   Hypoglycemia   Intestinal obstruction (HCC)   Malnutrition of moderate degree  DM2, hypoglycemia -most likely poor intake + vomiting + taking home sulfonylurea, initially on dextrose drip and then she was on TPN and being managed with TPN.  Now she is off of TPN since 7 PM 06/06/2023.  Hemoglobin A1c 7.3.  Blood sugar controlled on SSI.    Hernia with bowel obstruction, fascial dehiscence/leukocytosis/low-grade fever -- s/p ex lap hernia reduction, fascia closure (7/27)  Recent ex lap evac of hemorrhagic ovarian cyst, extensive LOA and sigmoid colon serosal repait (7/20).  TPN for nutrition.  Had yet another episode of fever of 101.2 at 2 AM 06/03/2023.  Afebrile since then.  NG tube pulled out 06/03/2023 by accident, patient is feeling better, abdominal pain is improving, no nausea.  Tolerating regular diet.  TPN stopped 7 PM 06/06/2023.  Leukocytosis improved. repeat CT yesterday with decrease in pelvic fluid collection, still suspect this is seroma - discussed with IR and drain output too high to remove. CT also showed some scattered fluid in abdominal wall - continue SQ drain and daily dressing changes on discharge patient afebrile, would continue Zosyn through tomorrow and no antibiotics afterward.   Acute hypoxic respiratory failure: Patient dropped her oxygen saturation and required 2 L of oxygen,  chest x-ray suggestive of atelectasis but no edema or consolidation.  Resolved and patient has been comfortable on room air for last several days.   AKI -resolved   Low phos -replace in TPN   Anemia Stable.  Monitor daily.  Sinus tachycardia/hypertension: Heart rate and blood pressure both improved but has mild tachycardia at times.  Stopped beta-blocker which was given temporarily.  DVT prophylaxis: enoxaparin (LOVENOX) injection 40 mg Start: 06/03/23 1000   Code Status: Full Code  Family Communication:  None present at bedside.  Plan of care discussed with patient in length and he/she verbalized understanding and agreed with it.  Status is: Inpatient Remains inpatient appropriate because: Patient improving.  Family and patient to get education about dressing changes today.  She feels comfortable going home tomorrow.  General surgery agrees.   Estimated body mass index is 37.25 kg/m as calculated from the following:   Height as of this encounter: 4' 9.01" (1.448 m).   Weight as of this encounter: 78.1 kg.    Nutritional Assessment: Body mass index is 37.25 kg/m.Marland Kitchen Seen by dietician.  I agree with the assessment and plan as outlined below: Nutrition Status: Nutrition Problem: Moderate Malnutrition Etiology: acute illness (bowel obstruction, nausea, vomiting) Signs/Symptoms: mild muscle depletion, energy intake < or equal to 50% for > or equal to 5 days Interventions: TPN  . Skin Assessment: I have examined the patient's skin and I agree with the wound assessment as performed by the wound care RN as outlined below:    Consultants:  Gynecology and general surgery  Procedures:  As above  Antimicrobials:  Anti-infectives (From admission, onward)    Start     Dose/Rate Route Frequency Ordered Stop   06/01/23 1400  piperacillin-tazobactam (ZOSYN) IVPB 3.375 g        3.375 g 12.5 mL/hr over 240 Minutes Intravenous Every 8 hours 06/01/23 1312     05/27/23 1545  ceFAZolin  (ANCEF) IVPB 2g/100 mL premix       Placed in "And" Linked Group   2 g 200 mL/hr over 30 Minutes Intravenous On call to O.R. 05/27/23 1539 05/27/23 1655   05/27/23 1545  metroNIDAZOLE (FLAGYL) IVPB 500 mg       Placed in "And" Linked Group   500 mg 100 mL/hr over 60 Minutes Intravenous On call to O.R. 05/27/23 1539 05/27/23 1753   05/27/23 1545  metroNIDAZOLE (FLAGYL) 500 MG/100ML IVPB       Note to Pharmacy: Crissie Sickles: cabinet override      05/27/23 1545 05/27/23 1653   05/27/23 1545  ceFAZolin (ANCEF) 2-4 GM/100ML-% IVPB       Note to Pharmacy: Crissie Sickles: cabinet override      05/27/23 1545 05/27/23 1651         Subjective: Seen and examined.  No abdominal pain or nausea.  Having bowel movements.  Tolerating regular diet.  No other complaint.  Objective: Vitals:   06/07/23 1958 06/07/23 2331 06/08/23 0411 06/08/23 0759  BP:  120/83 122/85 (!) 139/98  Pulse: 100 94 100 100  Resp: 17  17 18   Temp:  98.8 F (37.1 C) 98.8 F (37.1 C) 98.5 F (36.9 C)  TempSrc:  Oral Oral Oral  SpO2:  98% 91% 98%  Weight:   78.1 kg   Height:        Intake/Output Summary (Last 24 hours) at 06/08/2023 1108 Last data filed at 06/07/2023 1900 Gross per 24 hour  Intake 100 ml  Output 40 ml  Net 60 ml   Filed Weights   06/06/23 0558 06/07/23 0613 06/08/23 0411  Weight: 80 kg 78.7 kg 78.1 kg    Examination:  General exam: Appears calm and comfortable  Respiratory system: Clear to auscultation. Respiratory effort normal. Cardiovascular system: S1 & S2 heard, RRR. No JVD, murmurs, rubs, gallops or clicks. No pedal edema. Gastrointestinal system: Abdomen is nondistended, soft with mild generalized abdominal tenderness, has dressing in place with 2 intra-abdominal drains with serous fluid. No organomegaly or masses felt. Normal bowel sounds heard. Central nervous system: Alert and oriented. No focal neurological deficits. Extremities: Symmetric 5 x 5 power. Skin: No rashes, lesions  or ulcers.  Psychiatry: Judgement and insight appear normal. Mood & affect appropriate.   Data Reviewed: I have personally reviewed following labs and imaging studies  CBC: Recent Labs  Lab 06/02/23 0515 06/03/23 0541 06/04/23 0629 06/06/23 0623 06/07/23 0806 06/08/23 0448  WBC 16.3* 20.0* 18.8* 19.4* 17.2* 13.0*  NEUTROABS 9.1* 13.8* 11.8*  --   --  8.5*  HGB 9.5* 9.5* 9.4* 10.1* 9.8* 9.9*  HCT 28.8* 28.0* 28.3* 30.2* 30.0* 29.4*  MCV 83.7 82.6 83.2 85.1 84.0 85.7  PLT 398 395 411* 532* 559* 550*   Basic Metabolic Panel: Recent Labs  Lab 06/03/23 0541 06/04/23 0629 06/05/23 0639 06/06/23 0845 06/08/23 0448  NA 136 137 135 136 133*  K 4.0 4.3 4.2 4.3 4.2  CL 102 104 100 101 102  CO2 24 22 25 24 22   GLUCOSE 171* 174* 169* 174* 133*  BUN 16 15  12 13 15   CREATININE 0.66 0.67 0.73 0.71 0.69  CALCIUM 8.4* 8.2* 8.3* 8.5* 8.2*  MG 2.1  --  2.0  --   --   PHOS 3.8  --  3.5  --   --    GFR: Estimated Creatinine Clearance: 79.5 mL/min (by C-G formula based on SCr of 0.69 mg/dL). Liver Function Tests: Recent Labs  Lab 06/04/23 0629 06/05/23 0639 06/08/23 0448  AST 24 30 56*  ALT 31 48* 93*  ALKPHOS 146* 155* 136*  BILITOT 0.4 0.3 0.2*  PROT 5.9* 6.3* 6.1*  ALBUMIN 2.1* 2.2* 2.3*   No results for input(s): "LIPASE", "AMYLASE" in the last 168 hours.  No results for input(s): "AMMONIA" in the last 168 hours. Coagulation Profile: Recent Labs  Lab 06/02/23 1748  INR 1.2   Cardiac Enzymes: No results for input(s): "CKTOTAL", "CKMB", "CKMBINDEX", "TROPONINI" in the last 168 hours. BNP (last 3 results) No results for input(s): "PROBNP" in the last 8760 hours. HbA1C: Recent Labs    06/07/23 0816  HGBA1C 7.3*   CBG: Recent Labs  Lab 06/07/23 0610 06/07/23 1307 06/07/23 1607 06/07/23 2225 06/08/23 0610  GLUCAP 104* 125* 121* 147* 149*   Lipid Profile: No results for input(s): "CHOL", "HDL", "LDLCALC", "TRIG", "CHOLHDL", "LDLDIRECT" in the last 72  hours.   Thyroid Function Tests: No results for input(s): "TSH", "T4TOTAL", "FREET4", "T3FREE", "THYROIDAB" in the last 72 hours. Anemia Panel: No results for input(s): "VITAMINB12", "FOLATE", "FERRITIN", "TIBC", "IRON", "RETICCTPCT" in the last 72 hours. Sepsis Labs: Recent Labs  Lab 06/01/23 1134  PROCALCITON 0.76    Recent Results (from the past 240 hour(s))  Culture, blood (Routine X 2) w Reflex to ID Panel     Status: None   Collection Time: 06/01/23  1:11 AM   Specimen: BLOOD  Result Value Ref Range Status   Specimen Description BLOOD SITE NOT SPECIFIED  Final   Special Requests   Final    BOTTLES DRAWN AEROBIC AND ANAEROBIC Blood Culture adequate volume   Culture   Final    NO GROWTH 5 DAYS Performed at [facility], [address] [place], [place] [address]    Report Status 06/06/2023 FINAL  Final  Culture, blood (Routine X 2) w Reflex to ID Panel     Status: None   Collection Time: 06/01/23 11:35 AM   Specimen: BLOOD LEFT ARM  Result Value Ref Range Status   Specimen Description BLOOD LEFT ARM  Final   Special Requests   Final    BOTTLES DRAWN AEROBIC AND ANAEROBIC Blood Culture adequate volume   Culture   Final    NO GROWTH 5 DAYS Performed at [facility], [address] [place], [place] [address]    Report Status 06/06/2023 FINAL  Final  Urine Culture     Status: Abnormal   Collection Time: 06/02/23 10:50 AM   Specimen: Urine, Clean Catch  Result Value Ref Range Status   Specimen Description URINE, CLEAN CATCH  Final   Special Requests NONE  Final   Culture (A)  Final    <10,000 COLONIES/mL INSIGNIFICANT GROWTH Performed at [facility], [address] [place], [place] [address]    Report Status 06/03/2023 FINAL  Final  Aerobic/Anaerobic Culture w Gram Stain (surgical/deep wound)     Status: None   Collection Time: 06/02/23  8:19 PM   Specimen: Abscess  Result Value Ref Range Status   Specimen Description ABSCESS ABDOMEN  Final  Special Requests NONE  Final   Gram Stain   Final    FEW WBC PRESENT, PREDOMINANTLY PMN NO ORGANISMS SEEN    Culture   Final    No growth aerobically or anaerobically. Performed at [facility], [address], [place], [place] [address]    Report Status 06/07/2023 FINAL  Final     Radiology Studies: CT ABDOMEN PELVIS W CONTRAST  Result Date: 06/07/2023 CLINICAL DATA:  Postop abdominal pain. Post laparotomy with recent percutaneous drainage of fluid collection. EXAM: CT ABDOMEN AND PELVIS WITH CONTRAST TECHNIQUE: Multidetector CT imaging of the abdomen and pelvis was performed using the standard protocol following bolus administration of intravenous contrast. RADIATION DOSE REDUCTION: This exam was performed according to the departmental dose-optimization program which includes automated exposure control, adjustment of the mA and/or kV according to patient size and/or use of iterative reconstruction technique. CONTRAST:  75mL OMNIPAQUE IOHEXOL 350 MG/ML SOLN COMPARISON:  CT 06/01/2023. FINDINGS: Lower chest: Improvement in basilar atelectasis.  No pleural fluid. Hepatobiliary: No focal hepatic abnormality or intrahepatic collection. Partially distended gallbladder, normal for degree of distension. No biliary dilatation. Pancreas: No ductal dilatation or inflammation. Spleen: Normal in size without focal abnormality. Adrenals/Urinary Tract: No adrenal nodule. No hydronephrosis or perinephric edema. The urinary bladder is near completely empty. Wall thickening may be due to nondistention. Stomach/Bowel: Detailed bowel assessment is limited in the absence of enteric contrast. Diffusely fluid-filled small bowel with mild wall thickening. No obstruction. Small volume of colonic stool. Normal appendix. Vascular/Lymphatic: Normal caliber abdominal aorta. Scattered retroperitoneal mesenteric nodes, typically reactive. Reproductive: Hysterectomy. Other: Percutaneous drainage catheter in the pelvis  decreased size of pelvic fluid collection. Collection is essentially collapsed with minimal residual measuring 3.8 x 1.3 cm. Small amount of ill-defined fluid tracks superiorly from this collection. Open abdominal wound in the lower abdominal midline with drain in place. There is soft tissue thickening of the anterior abdominal wall musculature with small amount of locules of gas in the region of the drain. Suspect prior ventral abdominal wall hernia repair with mesh. Scattered ill-defined fluid in the abdominal wall and musculature but no discrete collection. No new abdominopelvic collection. Musculoskeletal: There are no acute or suspicious osseous abnormalities. IMPRESSION: 1. Percutaneous drainage catheter in the pelvis decreased size of pelvic fluid collection. Collection is essentially collapsed with minimal residual measuring 3.8 x 1.3 cm. Small amount of ill-defined fluid tracks superiorly from this collection. 2. Open abdominal wound in the lower abdominal midline with drain in place. There is soft tissue thickening of the anterior abdominal wall musculature with small amount of locules of gas in the region of the drain. Scattered ill-defined fluid in the abdominal wall soft tissues and musculature but no discrete collection. 3. Diffusely fluid-filled small bowel with mild wall thickening. Overall improvement in small bowel distension from prior exam. Suspect some degree of residual ileus. Electronically Signed   By: Narda Rutherford M.D.   On: 06/07/2023 15:16    Scheduled Meds:  acetaminophen (TYLENOL) oral liquid 160 mg/5 mL  650 mg Oral Q6H   Chlorhexidine Gluconate Cloth  6 each Topical Daily   cyclobenzaprine  5 mg Oral TID   enoxaparin (LOVENOX) injection  40 mg Subcutaneous Daily   escitalopram  20 mg Oral Daily   feeding supplement  237 mL Oral BID BM   insulin aspart  0-15 Units Subcutaneous TID WC   insulin aspart  0-5 Units Subcutaneous QHS   multivitamin with minerals  1 tablet Oral  Daily  sodium chloride flush  10-40 mL Intracatheter Q12H   Continuous Infusions:  sodium chloride 10 mL/hr (05/30/23 1552)   piperacillin-tazobactam (ZOSYN)  IV 3.375 g (06/08/23 0502)     LOS: 12 days   Hughie Closs, MD [facility]  06/08/2023, 11:08 AM   *Please note that this is a verbal dictation therefore any spelling or grammatical errors are due to the "Dragon [employer] One" system interpretation.  Please page via Amion and do not message via secure chat for urgent patient care matters. Secure chat can be used for non urgent patient care matters.  How to contact the [facility] Attending or Consulting provider 7A - 7P or covering provider during after hours 7P -7A, for this patient?  Check the care team in [facility] and look for a) attending/consulting [facility] provider listed and b) the [facility] team listed. Page or secure chat 7A-7P. Log into [email or web address] and use [place]'s universal password to access. If you do not have the password, please contact the hospital operator. Locate the [facility] provider you are looking for under [facility] and page to a number that you can be directly reached. If you still have difficulty reaching the provider, please page the [facility] (Director on Call) for the Hospitalists listed on amion for assistance.

## 2023-06-08 NOTE — TOC Initial Note (Addendum)
Transition of Care (TOC) - Initial/Assessment Note  Donn Pierini RN, BSN Transitions of Care Unit 4E- RN Case Manager See Treatment Team for direct phone #    Patient Details  Name: Maria [name] MRN: [id number] Date of Birth: 11-06-1980  Transition of Care [facility]) CM/SW Contact:    Darrold Span, RN Phone Number: 06/08/2023, 2:01 PM  Clinical Narrative:                 Orders placed for [facility] for wound care needs, CM in to speak with pt at bedside. Discussed transition needs for [facility]- list provided for [facility] choice Per CMS guidelines from [email or web address] website with star ratings (copy placed in shadow chart) - mother also on speaker phone during conversation.  Per pt she would like to use [facility] as first choice- [facility] as backup.   Pt voiced she has no DME needs at this time, declined need for RW or BSC.   Staff to provide education with pt/family on wound care prior to discharge.   Call made to [facility]- referral for [facility] accepted- they will follow up to schedule start of care post discharge.      Expected Discharge Plan: Home w Home Health Services Barriers to Discharge: Barriers Resolved   Patient Goals and CMS Choice Patient states their goals for this hospitalization and ongoing recovery are:: return home with support of family CMS Medicare.gov Compare Post Acute Care list provided to:: Patient Choice offered to / list presented to : Patient      Expected Discharge Plan and Services   Discharge Planning Services: CM Consult Post Acute Care Choice: Home Health Living arrangements for the past 2 months: Apartment                 DME Arranged: N/A DME Agency: NA       HH Arranged: RN HH AgencyHotel manager Home Health Care Date [facility] Agency Contacted: 06/08/23 Time HH Agency Contacted: 1401 Representative spoke with at [facility] Agency: Kandee Keen  Prior Living Arrangements/Services Living arrangements for the past 2 months: Apartment Lives with:: Self,  Parents Patient language and need for interpreter reviewed:: Yes Do you feel safe going back to the place where you live?: Yes      Need for Family Participation in Patient Care: Yes (Comment) Care giver support system in place?: Yes (comment)   Criminal Activity/Legal Involvement Pertinent to Current Situation/Hospitalization: No - Comment as needed  Activities of Daily Living Home Assistive Devices/Equipment: None ADL Screening (condition at time of admission) Patient's cognitive ability adequate to safely complete daily activities?: Yes Is the patient deaf or have difficulty hearing?: No Does the patient have difficulty seeing, even when wearing glasses/contacts?: No Does the patient have difficulty concentrating, remembering, or making decisions?: No Patient able to express need for assistance with ADLs?: Yes Does the patient have difficulty dressing or bathing?: No Independently performs ADLs?: No Communication: Independent Dressing (OT): Independent Grooming: Independent Feeding: Independent Bathing: Independent Toileting: Independent In/Out Bed: Independent Walks in Home: Independent Does the patient have difficulty walking or climbing stairs?: No Weakness of Legs: None Weakness of Arms/Hands: None  Permission Sought/Granted Permission sought to share information with : Facility Industrial/product designer granted to share information with : Yes, Verbal Permission Granted     Permission granted to share info w AGENCY: HH        Emotional Assessment Appearance:: Appears stated age Attitude/Demeanor/Rapport: Engaged Affect (typically observed): Accepting, Appropriate, Pleasant Orientation: : Oriented to Self, Oriented to  Place, Oriented to  Time, Oriented to Situation Alcohol / Substance Use: Not Applicable Psych Involvement: No (comment)  Admission diagnosis:  Ileus [facility]) [K56.7] Patient Active Problem List   Diagnosis Date Noted   Malnutrition of moderate  degree 05/29/2023   Hypoglycemia 05/28/2023   Intestinal obstruction (HCC) 05/28/2023   Ileus (HCC) 05/27/2023   Pelvic pain in female 05/19/2023   Chronic migraine 08/21/2018   Memory loss 08/21/2018   PCP:  Deatra James, MD Pharmacy:   [facility] [address] [place] [address] Phone: [phone] Fax: [phone]     Social Determinants of Health (SDOH) Social History: SDOH Screenings   Food Insecurity: No Food Insecurity (06/04/2023)  Housing: Low Risk  (06/04/2023)  Transportation Needs: No Transportation Needs (06/04/2023)  Utilities: Not At Risk (06/04/2023)  Tobacco Use: High Risk (06/02/2023)   SDOH Interventions:     Readmission Risk Interventions     No data to display

## 2023-06-08 NOTE — Plan of Care (Signed)
  Problem: Education: Goal: Knowledge of the prescribed therapeutic regimen will improve Outcome: Progressing   Problem: Self-Concept: Goal: Communication of feelings regarding changes in body function or appearance will improve Outcome: Progressing   Problem: Skin Integrity: Goal: Demonstration of wound healing without infection will improve Outcome: Progressing   Problem: Education: Goal: Knowledge of General Education information will improve Description: Including pain rating scale, medication(s)/side effects and non-pharmacologic comfort measures Outcome: Progressing   Problem: Health Behavior/Discharge Planning: Goal: Ability to manage health-related needs will improve Outcome: Progressing

## 2023-06-09 LAB — GLUCOSE, CAPILLARY
Glucose-Capillary: 141 mg/dL — ABNORMAL HIGH (ref 70–99)
Glucose-Capillary: 131 mg/dL — ABNORMAL HIGH (ref 70–99)
Glucose-Capillary: 145 mg/dL — ABNORMAL HIGH (ref 70–99)

## 2023-06-09 MED ORDER — ADULT MULTIVITAMIN W/MINERALS CH
1.0000 | ORAL_TABLET | Freq: Every day | ORAL | 0 refills | Status: DC
Start: 2023-06-10 — End: 2023-06-26
  Filled 2023-06-09: qty 130, 130d supply, fill #0

## 2023-06-09 MED ORDER — ENSURE ENLIVE PO LIQD
237.0000 mL | Freq: Two times a day (BID) | ORAL | 12 refills | Status: AC
  Filled 2023-06-09: qty 237, 1d supply, fill #0

## 2023-06-09 MED ORDER — GLYBURIDE 2.5 MG PO TABS
2.5000 mg | ORAL_TABLET | Freq: Two times a day (BID) | ORAL | 1 refills | Status: AC
  Filled 2023-06-09: qty 60, 30d supply, fill #0

## 2023-06-09 NOTE — Progress Notes (Signed)
PICC Removal Note: Patient's PICC line removed prior to discharge per MD order. PICC line removed from the RUA. PICC catheter tip visualized and intact. Pressure held until hemostasis achieved and pressure dressing applied. No redness, ecchymosis, edema, swelling, or drainage noted at site. Instructions provided on post PICC discharge care, including followup notification instructions.

## 2023-06-09 NOTE — Progress Notes (Signed)
Progress Note  13 Days Post-Op  Subjective: Tolerating diet, having bowel function, some urinary symptoms but voiding, pain control okay  Objective: Vital signs in last 24 hours: Temp:  [98.7 F (37.1 C)-99.1 F (37.3 C)] 98.7 F (37.1 C) (08/09 0147) Pulse Rate:  [102-106] 102 (08/09 0147) Resp:  [18-19] 18 (08/09 0147) BP: (117-124)/(73-89) 123/89 (08/09 0147) SpO2:  [96 %-99 %] 99 % (08/09 0147) Weight:  [77.8 kg] 77.8 kg (08/09 0500) Last BM Date : 06/07/23  Intake/Output from previous day: 08/08 0701 - 08/09 0700 In: 603.1 [P.O.:360; IV Piggyback:3.1] Out: 40 [Drains:40] Intake/Output this shift: No intake/output data recorded.  PE: General: Alert, NAD Heart: sinus tachy in 100s Lungs: Respiratory effort nonlabored on room air Abd: appropriately ttp, IR drain with SS fluid, surgical drain in SQ with SS fluid today; midline dressing CDI   Lab Results:  Recent Labs    06/07/23 0806 06/08/23 0448  WBC 17.2* 13.0*  HGB 9.8* 9.9*  HCT 30.0* 29.4*  PLT 559* 550*   BMET Recent Labs    06/06/23 0845 06/08/23 0448  NA 136 133*  K 4.3 4.2  CL 101 102  CO2 24 22  GLUCOSE 174* 133*  BUN 13 15  CREATININE 0.71 0.69  CALCIUM 8.5* 8.2*   PT/INR No results for input(s): "LABPROT", "INR" in the last 72 hours. CMP     Component Value Date/Time   NA 133 (L) 06/08/2023 0448   K 4.2 06/08/2023 0448   CL 102 06/08/2023 0448   CO2 22 06/08/2023 0448   GLUCOSE 133 (H) 06/08/2023 0448   BUN 15 06/08/2023 0448   CREATININE 0.69 06/08/2023 0448   CALCIUM 8.2 (L) 06/08/2023 0448   PROT 6.1 (L) 06/08/2023 0448   ALBUMIN 2.3 (L) 06/08/2023 0448   AST 56 (H) 06/08/2023 0448   ALT 93 (H) 06/08/2023 0448   ALKPHOS 136 (H) 06/08/2023 0448   BILITOT 0.2 (L) 06/08/2023 0448   GFRNONAA >60 06/08/2023 0448   GFRAA >60 05/31/2018 1753   Lipase     Component Value Date/Time   LIPASE 38 05/27/2023 1243       Studies/Results: CT ABDOMEN PELVIS W  CONTRAST  Result Date: 06/07/2023 CLINICAL DATA:  Postop abdominal pain. Post laparotomy with recent percutaneous drainage of fluid collection. EXAM: CT ABDOMEN AND PELVIS WITH CONTRAST TECHNIQUE: Multidetector CT imaging of the abdomen and pelvis was performed using the standard protocol following bolus administration of intravenous contrast. RADIATION DOSE REDUCTION: This exam was performed according to the departmental dose-optimization program which includes automated exposure control, adjustment of the mA and/or kV according to patient size and/or use of iterative reconstruction technique. CONTRAST:  75mL OMNIPAQUE IOHEXOL 350 MG/ML SOLN COMPARISON:  CT 06/01/2023. FINDINGS: Lower chest: Improvement in basilar atelectasis.  No pleural fluid. Hepatobiliary: No focal hepatic abnormality or intrahepatic collection. Partially distended gallbladder, normal for degree of distension. No biliary dilatation. Pancreas: No ductal dilatation or inflammation. Spleen: Normal in size without focal abnormality. Adrenals/Urinary Tract: No adrenal nodule. No hydronephrosis or perinephric edema. The urinary bladder is near completely empty. Wall thickening may be due to nondistention. Stomach/Bowel: Detailed bowel assessment is limited in the absence of enteric contrast. Diffusely fluid-filled small bowel with mild wall thickening. No obstruction. Small volume of colonic stool. Normal appendix. Vascular/Lymphatic: Normal caliber abdominal aorta. Scattered retroperitoneal mesenteric nodes, typically reactive. Reproductive: Hysterectomy. Other: Percutaneous drainage catheter in the pelvis decreased size of pelvic fluid collection. Collection is essentially collapsed with minimal residual measuring 3.8  x 1.3 cm. Small amount of ill-defined fluid tracks superiorly from this collection. Open abdominal wound in the lower abdominal midline with drain in place. There is soft tissue thickening of the anterior abdominal wall musculature  with small amount of locules of gas in the region of the drain. Suspect prior ventral abdominal wall hernia repair with mesh. Scattered ill-defined fluid in the abdominal wall and musculature but no discrete collection. No new abdominopelvic collection. Musculoskeletal: There are no acute or suspicious osseous abnormalities. IMPRESSION: 1. Percutaneous drainage catheter in the pelvis decreased size of pelvic fluid collection. Collection is essentially collapsed with minimal residual measuring 3.8 x 1.3 cm. Small amount of ill-defined fluid tracks superiorly from this collection. 2. Open abdominal wound in the lower abdominal midline with drain in place. There is soft tissue thickening of the anterior abdominal wall musculature with small amount of locules of gas in the region of the drain. Scattered ill-defined fluid in the abdominal wall soft tissues and musculature but no discrete collection. 3. Diffusely fluid-filled small bowel with mild wall thickening. Overall improvement in small bowel distension from prior exam. Suspect some degree of residual ileus. Electronically Signed   By: Narda Rutherford M.D.   On: 06/07/2023 15:16    Anti-infectives: Anti-infectives (From admission, onward)    Start     Dose/Rate Route Frequency Ordered Stop   06/08/23 1400  piperacillin-tazobactam (ZOSYN) IVPB 3.375 g        3.375 g 12.5 mL/hr over 240 Minutes Intravenous Every 8 hours 06/08/23 1123 06/10/23 0559   06/01/23 1400  piperacillin-tazobactam (ZOSYN) IVPB 3.375 g  Status:  Discontinued        3.375 g 12.5 mL/hr over 240 Minutes Intravenous Every 8 hours 06/01/23 1312 06/08/23 1123   05/27/23 1545  ceFAZolin (ANCEF) IVPB 2g/100 mL premix       Placed in "And" Linked Group   2 g 200 mL/hr over 30 Minutes Intravenous On call to O.R. 05/27/23 1539 05/27/23 1655   05/27/23 1545  metroNIDAZOLE (FLAGYL) IVPB 500 mg       Placed in "And" Linked Group   500 mg 100 mL/hr over 60 Minutes Intravenous On call to  O.R. 05/27/23 1539 05/27/23 1753   05/27/23 1545  metroNIDAZOLE (FLAGYL) 500 MG/100ML IVPB       Note to Pharmacy: Crissie Sickles: cabinet override      05/27/23 1545 05/27/23 1653   05/27/23 1545  ceFAZolin (ANCEF) 2-4 GM/100ML-% IVPB       Note to Pharmacy: Crissie Sickles: cabinet override      05/27/23 1545 05/27/23 1651        Assessment/Plan   S/p  ex-lap with extensive LOA, evacuation of hemorrhagic ovarian cyst and serosal repair of colon 05/20/23  Fascial dehiscence of Pfannenstiel incision and small bowel herniation with obstruction  POD#13 - s/p re-exploration, hernia reduction, vicryl mesh underlay and fascial closure  7/27 - tolerating diet and having bowel function  - CT 8/1 with intraperitoneal collection in pelvis - s/p IR drain 8/2 with peach colored fluid evacuated, NGTD - repeat CT yesterday with decrease in pelvic fluid collection, still suspect this is seroma - discussed with IR and drain output too high to remove - CT also showed some scattered fluid in abdominal wall - continue SQ drain and daily dressing changes on discharge - WBC down to 13K and patient afebrile, stop ATBX - binder and continue drain in SQ, monitoring incision closely - BID wet to dry in the  hospital - ready for discharge today - GYN has signed off   FEN: CM diet VTE: lovenox ID: [email or web address] to stop today    - per primary attending -  T2DM AKI on CKD    LOS: 13 days      Quentin Ore, MD [facility] 06/09/2023, 8:17 AM Please see Amion for pager number during day hours 7:00am-4:30pm

## 2023-06-09 NOTE — Discharge Summary (Signed)
Physician Discharge Summary  Patient ID: Maria [name] MRN: [id number] DOB/AGE: [birth year] 42 y.o.  Admit date: 05/27/2023 Discharge date: 06/09/2023  Admission Diagnoses:  Discharge Diagnoses:  Principal Problem:   Ileus (HCC) Active Problems:   Hypoglycemia   Intestinal obstruction (HCC)   Malnutrition of moderate degree   Discharged Condition: stable  Hospital Course:  Patient is a 41 year old female, with past medical history significant diabetes mellitus, depression, s/p exploratory laparatomy and evacuation of left hemorrhagic ovarian cyst, and extensive intraabdominal LOA, sigmoid colon serosal tear repair, and was discharged on 05/25/2023.  Patient was admited to ICU with hypoglycemia, concern for ileus. S/p re-exploration, hernia reduction, Vicryl mesh underlay and fascial closure.  KUB obtained which revealed dilated small bowel loops, concerning for possible ileus.  Patient was initially managed by the ICU team.  Hypoglycemia was managed with D50.  Patient was on Glyburide prior to admission.  Patient underwent ex lap hernia reduction, fascia closure (05/27/2023).    DM2, hypoglycemia -most likely poor intake + vomiting + taking home sulfonylurea, initially on dextrose drip and then she was on TPN and being managed with TPN.  Now she is off of TPN since 7 PM 06/06/2023.  Hemoglobin A1c 7.3.  Blood sugar controlled on SSI.    Hernia with bowel obstruction, fascial dehiscence/leukocytosis/low-grade fever -- s/p ex lap hernia reduction, fascia closure (7/27)  Recent ex lap evac of hemorrhagic ovarian cyst, extensive LOA and sigmoid colon serosal repait (7/20).  TPN for nutrition.  Had yet another episode of fever of 101.2 at 2 AM 06/03/2023.  Afebrile since then.  NG tube pulled out 06/03/2023 by accident, patient is feeling better, abdominal pain is improving, no nausea.  Tolerating regular diet.  TPN stopped 7 PM 06/06/2023.  Leukocytosis improved. repeat CT yesterday with decrease in  pelvic fluid collection, still suspect this is seroma - discussed with IR and drain output too high to remove. CT also showed some scattered fluid in abdominal wall - continue SQ drain and daily dressing changes on discharge patient afebrile, would continue Zosyn through tomorrow and no antibiotics afterward.   Acute hypoxic respiratory failure: Patient dropped her oxygen saturation and required 2 L of oxygen, chest x-ray suggestive of atelectasis but no edema or consolidation.  Resolved and patient has been comfortable on room air for last several days.   AKI -resolved   Low phos -replace in TPN   Anemia Stable.  Monitor daily.   Sinus tachycardia/hypertension: Heart rate and blood pressure both improved but has mild tachycardia at times.  Stopped beta-blocker which was given temporarily.    Consults: general surgery, gynecology, and Interventional Radiology.  Significant Diagnostic Studies:  CT scan of abdomen and pelvis with contrast revealed: "There is interval development of large ventral hernia anteriorly in the pelvis which contains multiple loops of small bowel, several of which are dilated. This appears to be resulting in at least partial obstruction of the more proximal small bowel loops, as well as moderate to severe gastric distention. There are noted surgical clips involving the lower anterior abdominal wall in the vicinity of the hernia".  CT Head without contrast: -Negative  Abdominal X ray revealed: "Surgical changes along the pelvis. Multiple dilated loops of bowel in the midabdomen. Ileus versus obstruction is possible. Please correlate with clinical presentation and if needed follow up IV contrast CT scan when appropriate"     Discharge Exam: Blood pressure 109/71, pulse (!) 107, temperature 98.4 F (36.9 C), temperature source Oral, resp. rate  18, height 4' 9.01" (1.448 m), weight 77.8 kg, last menstrual period 05/12/2015, SpO2 99%.   Disposition:  Discharge disposition: 01-Home or Self Care       Discharge Instructions     Diet - low sodium heart healthy   Complete by: As directed    Discharge wound care:   Complete by: As directed    Continue current dressing regimen.   Increase activity slowly   Complete by: As directed       Allergies as of 06/09/2023   No Known Allergies      Medication List     STOP taking these medications    atorvastatin 10 MG tablet Commonly known as: LIPITOR   ibuprofen 600 MG tablet Commonly known as: ADVIL   ondansetron 4 MG disintegrating tablet Commonly known as: ZOFRAN-ODT       TAKE these medications    albuterol 108 (90 Base) MCG/ACT inhaler Commonly known as: VENTOLIN HFA Inhale 1-2 puffs into the lungs every 6 (six) hours as needed for wheezing or shortness of breath.   escitalopram 20 MG tablet Commonly known as: LEXAPRO Take 20 mg by mouth daily.   feeding supplement Liqd Take 237 mLs by mouth 2 (two) times daily between meals. Start taking on: June 10, 2023   fluticasone 50 MCG/ACT nasal spray Commonly known as: FLONASE Place 1 spray into both nostrils 2 (two) times daily as needed for allergies or rhinitis.   glyBURIDE 2.5 MG tablet Commonly known as: DIABETA Take 1 tablet (2.5 mg total) by mouth 2 (two) times daily with a meal. What changed:  medication strength how much to take   hydrOXYzine 25 MG tablet Commonly known as: ATARAX Take 25 mg by mouth every 8 (eight) hours as needed (sleep).   multivitamin with minerals Tabs tablet Take 1 tablet by mouth daily. Start taking on: June 10, 2023   oxyCODONE 5 MG immediate release tablet Commonly known as: Oxy IR/ROXICODONE Take 1 tablet (5 mg total) by mouth every 6 (six) hours as needed for moderate pain.   Trulicity 0.75 MG/0.5ML Sopn Generic drug: Dulaglutide Inject 0.75 mg into the skin every Sunday.               Discharge Care Instructions  (From admission, onward)            Start     Ordered   06/09/23 0000  Discharge wound care:       Comments: Continue current dressing regimen.   06/09/23 1821            Follow-up Information     Fritzi Mandes, MD. Schedule an appointment as soon as possible for a visit in 10 day(s).   Specialty: General Surgery Why: We are working on scheduling your follow up appointment, please call to confirm appointment date/time. Please arrive 30 min prior to appointment time to check in. Contact information: [address] [place] [address] [phone]         Warden Fillers, MD Follow up.   Specialty: Obstetrics and Gynecology Contact information: [address] [place] [address] [phone]         [facility] Follow up.   Specialty: Home Health Services Why: HHRN arranged- they will contact you to schedule Contact information: [address] [place] [address] [phone]                  Time spent: 35 minutes.  SignedBarnetta Chapel 06/09/2023, 6:22 PM

## 2023-06-09 NOTE — Progress Notes (Addendum)
Referring Physician(s): Adin Hector PA   Supervising Physician: Richarda Overlie  Patient Status:  [facility] - In-pt  Chief Complaint:  fascial dehiscence POD 10 L hemorrhage ovarian complicated by Intra abdominal abscess s/p left lateral drain placement on 8.3.24  Subjective:  Patient laying in bed. NAD. States that she is going home today.    Allergies: Patient has no known allergies.  Medications: Prior to Admission medications   Medication Sig Start Date End Date Taking? Authorizing Provider  albuterol (PROVENTIL HFA;VENTOLIN HFA) 108 (90 BASE) MCG/ACT inhaler Inhale 1-2 puffs into the lungs every 6 (six) hours as needed for wheezing or shortness of breath. 10/10/15  Yes Lorre Nick, MD  atorvastatin (LIPITOR) 10 MG tablet Take 10 mg by mouth daily. 04/08/23  Yes [provider]  Dulaglutide (TRULICITY) 0.75 MG/0.5ML SOPN Inject 0.75 mg into the skin every Sunday.   Yes [provider]  escitalopram (LEXAPRO) 20 MG tablet Take 20 mg by mouth daily.   Yes [provider]  fluticasone (FLONASE) 50 MCG/ACT nasal spray Place 1 spray into both nostrils 2 (two) times daily as needed for allergies or rhinitis. 02/17/23  Yes [provider]  glyBURIDE (DIABETA) 5 MG tablet Take 1 tablet (5 mg total) by mouth 2 (two) times daily with a meal. 05/25/23  Yes Hermina Staggers, MD  hydrOXYzine (ATARAX) 25 MG tablet Take 25 mg by mouth every 8 (eight) hours as needed (sleep).   Yes [provider]  ibuprofen (ADVIL) 600 MG tablet Take 1 tablet (600 mg total) by mouth every 6 (six) hours as needed. Patient taking differently: Take 600 mg by mouth every 6 (six) hours as needed (for pain). 05/25/23  Yes Hermina Staggers, MD  ondansetron (ZOFRAN-ODT) 4 MG disintegrating tablet Take 1 tablet (4 mg total) by mouth every 8 (eight) hours as needed for nausea. Patient taking differently: Take 4 mg by mouth every 8 (eight) hours as needed for nausea (DISSOLVE ORALLY).  05/25/23  Yes Hermina Staggers, MD  oxyCODONE (OXY IR/ROXICODONE) 5 MG immediate release tablet Take 1 tablet (5 mg total) by mouth every 6 (six) hours as needed for moderate pain. 05/25/23  Yes Hermina Staggers, MD     Vital Signs: BP 120/83 (BP Location: Left Arm)   Pulse 100   Temp 98.5 F (36.9 C) (Oral)   Resp 18   Ht 4' 9.01" (1.448 m)   Wt 171 lb 8.3 oz (77.8 kg)   LMP 05/12/2015 (Exact Date)   SpO2 99%   BMI 37.11 kg/m   Physical Exam Vitals and nursing note reviewed.  Constitutional:      Appearance: She is well-developed. She is obese.  HENT:     Head: Normocephalic and atraumatic.  Pulmonary:     Effort: Pulmonary effort is normal.  Abdominal:     Comments: Positive left later  drain to suction. Site is unremarkable with no erythema, edema, tenderness, bleeding or drainage noted at exit site. Suture and stat lock in place. Dressing is clean dry and intact. Trace of serosanguinous fluid noted in  bulb suction device.   + surgical drain   Musculoskeletal:        General: Normal range of motion.     Cervical back: Normal range of motion.  Skin:    General: Skin is warm and dry.  Neurological:     General: No focal deficit present.     Mental Status: She is alert and oriented to person, place,  and time.  Psychiatric:        Mood and Affect: Mood normal.        Behavior: Behavior normal.        Thought Content: Thought content normal.        Judgment: Judgment normal.     Imaging: CT ABDOMEN PELVIS W CONTRAST  Result Date: 06/07/2023 CLINICAL DATA:  Postop abdominal pain. Post laparotomy with recent percutaneous drainage of fluid collection. EXAM: CT ABDOMEN AND PELVIS WITH CONTRAST TECHNIQUE: Multidetector CT imaging of the abdomen and pelvis was performed using the standard protocol following bolus administration of intravenous contrast. RADIATION DOSE REDUCTION: This exam was performed according to the departmental dose-optimization program which includes  automated exposure control, adjustment of the mA and/or kV according to patient size and/or use of iterative reconstruction technique. CONTRAST:  75mL OMNIPAQUE IOHEXOL 350 MG/ML SOLN COMPARISON:  CT 06/01/2023. FINDINGS: Lower chest: Improvement in basilar atelectasis.  No pleural fluid. Hepatobiliary: No focal hepatic abnormality or intrahepatic collection. Partially distended gallbladder, normal for degree of distension. No biliary dilatation. Pancreas: No ductal dilatation or inflammation. Spleen: Normal in size without focal abnormality. Adrenals/Urinary Tract: No adrenal nodule. No hydronephrosis or perinephric edema. The urinary bladder is near completely empty. Wall thickening may be due to nondistention. Stomach/Bowel: Detailed bowel assessment is limited in the absence of enteric contrast. Diffusely fluid-filled small bowel with mild wall thickening. No obstruction. Small volume of colonic stool. Normal appendix. Vascular/Lymphatic: Normal caliber abdominal aorta. Scattered retroperitoneal mesenteric nodes, typically reactive. Reproductive: Hysterectomy. Other: Percutaneous drainage catheter in the pelvis decreased size of pelvic fluid collection. Collection is essentially collapsed with minimal residual measuring 3.8 x 1.3 cm. Small amount of ill-defined fluid tracks superiorly from this collection. Open abdominal wound in the lower abdominal midline with drain in place. There is soft tissue thickening of the anterior abdominal wall musculature with small amount of locules of gas in the region of the drain. Suspect prior ventral abdominal wall hernia repair with mesh. Scattered ill-defined fluid in the abdominal wall and musculature but no discrete collection. No new abdominopelvic collection. Musculoskeletal: There are no acute or suspicious osseous abnormalities. IMPRESSION: 1. Percutaneous drainage catheter in the pelvis decreased size of pelvic fluid collection. Collection is essentially collapsed  with minimal residual measuring 3.8 x 1.3 cm. Small amount of ill-defined fluid tracks superiorly from this collection. 2. Open abdominal wound in the lower abdominal midline with drain in place. There is soft tissue thickening of the anterior abdominal wall musculature with small amount of locules of gas in the region of the drain. Scattered ill-defined fluid in the abdominal wall soft tissues and musculature but no discrete collection. 3. Diffusely fluid-filled small bowel with mild wall thickening. Overall improvement in small bowel distension from prior exam. Suspect some degree of residual ileus. Electronically Signed   By: Narda Rutherford M.D.   On: 06/07/2023 15:16    Labs:  CBC: Recent Labs    06/04/23 0629 06/06/23 0623 06/07/23 0806 06/08/23 0448  WBC 18.8* 19.4* 17.2* 13.0*  HGB 9.4* 10.1* 9.8* 9.9*  HCT 28.3* 30.2* 30.0* 29.4*  PLT 411* 532* 559* 550*    COAGS: Recent Labs    06/02/23 1748  INR 1.2    BMP: Recent Labs    06/04/23 0629 06/05/23 0639 06/06/23 0845 06/08/23 0448  NA 137 135 136 133*  K 4.3 4.2 4.3 4.2  CL 104 100 101 102  CO2 22 25 24 22   GLUCOSE 174* 169* 174* 133*  BUN  15 12 13 15   CALCIUM 8.2* 8.3* 8.5* 8.2*  CREATININE 0.67 0.73 0.71 0.69  GFRNONAA >60 >60 >60 >60    LIVER FUNCTION TESTS: Recent Labs    06/01/23 0203 06/04/23 0629 06/05/23 0639 06/08/23 0448  BILITOT 0.4 0.4 0.3 0.2*  AST 17 24 30  56*  ALT 18 31 48* 93*  ALKPHOS 62 146* 155* 136*  PROT 6.1* 5.9* 6.3* 6.1*  ALBUMIN 2.4* 2.1* 2.2* 2.3*    Assessment and Plan:  42 y.o. female inpatient. History of DM, uterine fibroids. S/p sigmoid colon serosal tear repair on 7.20.24. Patient was discharged on 7.25.27 and returned to the ED at [facility] on 7.27.24 with abdominal pain and vomiting  s/p ex lap of hemorrhagic left sided ovarian cyst with extensive lysis of adhesions  on 7.27.24. Found to have a post op fluid collection on CT CAP from 8.1.24. On 8.3.24 IR placed a 12 Fr  abscess drain to the intra abdominal abscess.   Cx showed no growth.  F/U CT obtained on, 06/07/23 general surgery requested for review for possible drain removal. CT reviewed with Dr. Bryn Gulling on 8/7, drain to remain in place due to output > 20 mL a day.  Output has been minimal for 2 days, discussed with Dr. Lowella Dandy, OK to remove drain.   Drain was removed  without difficulty, patient tolerated well.  Dressing was placed. Patient was instructed to keep the left groin area dry and clean.  She verbalized understanding.  RN notified that the IR drain was removed.   IR will sign off, please call for questions and concerns.   Electronically Signed: Willette Brace, PA-C 06/09/2023, 10:16 AM   I spent a total of 15 Minutes at the patient's bedside AND on the patient's hospital floor or unit, greater than 50% of which was counseling/coordinating care for left lateral abscess drain.

## 2023-06-09 NOTE — Progress Notes (Signed)
Physical Therapy Treatment Patient Details Name: Maria [name] MRN: [id number] DOB: 01/10/1981 Today's Date: 06/09/2023   History of Present Illness 42 yo female admitted 7/27 with N/V, ileus. Fascial dehiscence of incision and small bowel herniation with obstruction requiring re-exploration, hernia reduction, vicryl mesh underlay and fascial closure. PMHx: Pt admission 7/19-7/25/24 with lt flank pain. 7/20 S/p ex-lap with evacuation of hemorrhagic ovarian cyst and serosal repair of colon. Migraine, CKD, DM    PT Comments  Pt received in supine and agreeable to session. Pt reporting improved pain today and demonstrates improved activity tolerance. Pt able to tolerate increased gait distance with and without UE support on IV pole. Pt demonstrates improved upright posture and stability. Pt and pt's mom report no mobility concerns at home. Anticipate pt and family will be able to manage pt's mobility needs at home when medically ready for discharge.    If plan is discharge home, recommend the following: A little help with walking and/or transfers;Assistance with cooking/housework;Assist for transportation;Help with stairs or ramp for entrance   Can travel by private vehicle        Equipment Recommendations  BSC/3in1    Recommendations for Other Services       Precautions / Restrictions Precautions Precautions: Other (comment) Precaution Comments: abdominal binder, jp drain Restrictions Weight Bearing Restrictions: No     Mobility  Bed Mobility               General bed mobility comments: Pt sitting EOB at beginning and end of session    Transfers Overall transfer level: Needs assistance Equipment used: None Transfers: Sit to/from Stand Sit to Stand: Modified independent (Device/Increase time)           General transfer comment: From low EOB    Ambulation/Gait Ambulation/Gait assistance: Modified independent (Device/Increase time) Gait Distance (Feet): 900  Feet Assistive device: None, IV Pole Gait Pattern/deviations: Step-through pattern       General Gait Details: Pt with improved pace and upright posture. Pt demonstrating steady gait pattern with and without UE support.      Balance Overall balance assessment: Mild deficits observed, not formally tested                                          Cognition Arousal: Alert Behavior During Therapy: WFL for tasks assessed/performed Overall Cognitive Status: Within Functional Limits for tasks assessed                                          Exercises      General Comments        Pertinent Vitals/Pain Pain Assessment Pain Assessment: 0-10 Pain Score: 1  Pain Location: abdomen Pain Descriptors / Indicators: Discomfort Pain Intervention(s): Monitored during session, Repositioned     PT Goals (current goals can now be found in the care plan section) Acute Rehab PT Goals Patient Stated Goal: Go to her sisters house on discharge PT Goal Formulation: With patient Time For Goal Achievement: 06/12/23 Potential to Achieve Goals: Good Progress towards PT goals: Progressing toward goals    Frequency    Min 1X/week      PT Plan Current plan remains appropriate       AM-PAC PT "6 Clicks" Mobility   Outcome Measure  Help needed turning  from your back to your side while in a flat bed without using bedrails?: None Help needed moving from lying on your back to sitting on the side of a flat bed without using bedrails?: None Help needed moving to and from a bed to a chair (including a wheelchair)?: None Help needed standing up from a chair using your arms (e.g., wheelchair or bedside chair)?: None Help needed to walk in hospital room?: A Little Help needed climbing 3-5 steps with a railing? : A Little 6 Click Score: 22    End of Session   Activity Tolerance: Patient tolerated treatment well Patient left: with call bell/phone within  reach;in bed;with family/visitor present (sitting EOB) Nurse Communication: Mobility status PT Visit Diagnosis: Other abnormalities of gait and mobility (R26.89)     Time: 8119-1478 PT Time Calculation (min) (ACUTE ONLY): 21 min  Charges:    $Gait Training: 8-22 mins PT General Charges $$ ACUTE PT VISIT: 1 Visit                     Johny Shock, PTA Acute Rehabilitation Services Secure Chat Preferred  [phone]    Johny Shock 06/09/2023, 1:36 PM

## 2023-06-10 ENCOUNTER — Other Ambulatory Visit (HOSPITAL_COMMUNITY): Payer: Self-pay

## 2023-06-26 ENCOUNTER — Encounter: Payer: Self-pay | Admitting: Obstetrics and Gynecology

## 2023-06-26 ENCOUNTER — Other Ambulatory Visit: Payer: Self-pay

## 2023-06-26 ENCOUNTER — Ambulatory Visit (INDEPENDENT_AMBULATORY_CARE_PROVIDER_SITE_OTHER): Payer: Managed Care, Other (non HMO) | Admitting: Obstetrics and Gynecology

## 2023-06-26 VITALS — BP 102/70 | HR 96 | Wt 168.4 lb

## 2023-06-26 DIAGNOSIS — Z4889 Encounter for other specified surgical aftercare: Secondary | ICD-10-CM | POA: Diagnosis not present

## 2023-06-26 NOTE — Progress Notes (Signed)
    Subjective:    Maria [name] is a 42 y.o. female who presents to the clinic status post repair of wound dehiscence on 05/27/23. Pain is controlled with current analgesics. Medications being used: narcotic analgesics including oxycodone (Oxycontin, Oxyir).  Eating a regular diet without difficulty. Bowel movements are normal. No other significant postoperative concerns.  Pt just had post op with general surgery as well and has another follow up 08/02/23.  Long discussion with patient and family member regarding wound dehiscence and potential for recurrent adhesive disease underneath the repair.  The following portions of the patient's history were reviewed and updated as appropriate: allergies, current medications, past family history, past medical history, past social history, past surgical history, and problem list..  Last pap smear was [facility] and was normal ; however, since pt is s/p hysterectomy, it is not advised that she continue pap smears..  Review of Systems Pertinent items are noted in HPI.   Objective:   BP 102/70   Pulse 96   Wt 168 lb 6.4 oz (76.4 kg)   LMP 05/12/2015 (Exact Date)   BMI 36.43 kg/m  Constitutional:  Well-developed, well-nourished female in no acute distress.   Skin: Skin is warm and dry, no rash noted, not diaphoretic,no erythema, no pallor.  Cardiovascular: Normal heart rate noted  Respiratory: Effort and breath sounds normal, no problems with respiration noted  Abdomen: Soft, bowel sounds active, non-tender, no abnormal masses  Incision: Healing well, no drainage, no erythema, no hernia, no seroma, no swelling, no dehiscence, incision well approximated, inferior portion of incision recently packed with moist gauze, otherwise well healing incision  Pelvic:   Deferred   Surgical pathology () none Assessment:   Doing well postoperatively.  Operative findings again reviewed. Pathology report discussed.   Plan:   1. Continue any current  medications. 2. Wound care discussed. 3. Activity restrictions: no lifting more than 5 pounds for the next 1-2 months 4. Anticipated return to work: 2-3 weeks. 5. Follow up as needed 6.  Routine preventative health maintenance measures emphasized. Please refer to After Visit Summary for other counseling recommendations.    Mariel Aloe, MD, FACOG Attending Obstetrician & Gynecologist Center for [facility], [facility]

## 2023-07-12 DIAGNOSIS — Z9889 Other specified postprocedural states: Secondary | ICD-10-CM | POA: Diagnosis not present

## 2023-07-12 DIAGNOSIS — E118 Type 2 diabetes mellitus with unspecified complications: Secondary | ICD-10-CM | POA: Diagnosis not present

## 2023-07-12 DIAGNOSIS — Z8719 Personal history of other diseases of the digestive system: Secondary | ICD-10-CM | POA: Diagnosis not present

## 2023-07-12 DIAGNOSIS — E162 Hypoglycemia, unspecified: Secondary | ICD-10-CM | POA: Diagnosis not present

## 2023-07-12 DIAGNOSIS — Z0289 Encounter for other administrative examinations: Secondary | ICD-10-CM

## 2023-09-01 DIAGNOSIS — Z9889 Other specified postprocedural states: Secondary | ICD-10-CM | POA: Diagnosis not present

## 2023-09-01 DIAGNOSIS — Z09 Encounter for follow-up examination after completed treatment for conditions other than malignant neoplasm: Secondary | ICD-10-CM | POA: Diagnosis not present

## 2023-09-01 DIAGNOSIS — Z8719 Personal history of other diseases of the digestive system: Secondary | ICD-10-CM | POA: Diagnosis not present

## 2024-10-02 ENCOUNTER — Encounter (HOSPITAL_COMMUNITY): Payer: Self-pay | Admitting: Surgery
